# Patient Record
Sex: Male | Born: 1940 | Race: White | Hispanic: Yes | Marital: Married | Smoking: Former smoker
Health system: Southern US, Community
[De-identification: ages and names within clinical notes are randomized; demographics above are authoritative.]

## PROBLEM LIST (undated history)

## (undated) DIAGNOSIS — G459 Transient cerebral ischemic attack, unspecified: Secondary | ICD-10-CM

## (undated) DIAGNOSIS — E785 Hyperlipidemia, unspecified: Secondary | ICD-10-CM

## (undated) DIAGNOSIS — I10 Essential (primary) hypertension: Secondary | ICD-10-CM

## (undated) DIAGNOSIS — I639 Cerebral infarction, unspecified: Secondary | ICD-10-CM

## (undated) DIAGNOSIS — Z85828 Personal history of other malignant neoplasm of skin: Secondary | ICD-10-CM

## (undated) DIAGNOSIS — K2970 Gastritis, unspecified, without bleeding: Secondary | ICD-10-CM

## (undated) DIAGNOSIS — IMO0002 Reserved for concepts with insufficient information to code with codable children: Secondary | ICD-10-CM

## (undated) HISTORY — DX: Personal history of other malignant neoplasm of skin: Z85.828

## (undated) HISTORY — DX: Cerebral infarction, unspecified: I63.9

## (undated) HISTORY — DX: Gastritis, unspecified, without bleeding: K29.70

## (undated) HISTORY — DX: Reserved for concepts with insufficient information to code with codable children: IMO0002

## (undated) HISTORY — DX: Hyperlipidemia, unspecified: E78.5

## (undated) HISTORY — DX: Essential (primary) hypertension: I10

---

## 1995-10-12 HISTORY — PX: HEMORRHOID SURGERY: SHX153

## 2004-12-29 ENCOUNTER — Ambulatory Visit: Payer: Self-pay | Admitting: Family Medicine

## 2004-12-30 ENCOUNTER — Ambulatory Visit: Payer: Self-pay | Admitting: Family Medicine

## 2005-02-02 ENCOUNTER — Ambulatory Visit: Payer: Self-pay | Admitting: Family Medicine

## 2006-07-07 ENCOUNTER — Ambulatory Visit: Payer: Self-pay | Admitting: Family Medicine

## 2006-08-08 ENCOUNTER — Ambulatory Visit: Payer: Self-pay | Admitting: Family Medicine

## 2006-10-21 ENCOUNTER — Ambulatory Visit: Payer: Self-pay | Admitting: Family Medicine

## 2006-10-21 LAB — CONVERTED CEMR LAB
AST: 27 units/L (ref 0–37)
Blood Glucose, Fasting: 91 mg/dL
Calcium: 9.1 mg/dL (ref 8.4–10.5)
Chloride: 103 meq/L (ref 96–112)
Cholesterol: 179 mg/dL (ref 0–200)
Glucose, Bld: 91 mg/dL (ref 70–99)
Lymphocytes Relative: 27 % (ref 12–46)
Lymphs Abs: 1.5 10*3/uL (ref 0.7–3.3)
Monocytes Relative: 7 % (ref 3–11)
Neutro Abs: 3.6 10*3/uL (ref 1.7–7.7)
Neutrophils Relative %: 63 % (ref 43–77)
PSA: 0.78 ng/mL (ref 0.10–4.00)
Platelets: 147 10*3/uL — ABNORMAL LOW (ref 150–400)
Potassium: 4.3 meq/L (ref 3.5–5.3)
RDW: 13.5 % (ref 11.5–14.0)
Total CHOL/HDL Ratio: 3.7
VLDL: 43 mg/dL — ABNORMAL HIGH (ref 0–40)
WBC: 5.7 10*3/uL (ref 4.0–10.5)

## 2006-10-24 ENCOUNTER — Ambulatory Visit: Payer: Self-pay | Admitting: Gastroenterology

## 2006-10-31 ENCOUNTER — Ambulatory Visit: Payer: Self-pay | Admitting: Gastroenterology

## 2006-10-31 ENCOUNTER — Encounter (INDEPENDENT_AMBULATORY_CARE_PROVIDER_SITE_OTHER): Payer: Self-pay | Admitting: *Deleted

## 2007-02-24 ENCOUNTER — Telehealth (INDEPENDENT_AMBULATORY_CARE_PROVIDER_SITE_OTHER): Payer: Self-pay | Admitting: *Deleted

## 2007-06-08 ENCOUNTER — Ambulatory Visit: Payer: Self-pay | Admitting: Family Medicine

## 2007-06-08 ENCOUNTER — Telehealth: Payer: Self-pay | Admitting: Family Medicine

## 2007-06-08 DIAGNOSIS — Z85828 Personal history of other malignant neoplasm of skin: Secondary | ICD-10-CM

## 2007-06-08 DIAGNOSIS — E785 Hyperlipidemia, unspecified: Secondary | ICD-10-CM | POA: Insufficient documentation

## 2007-06-08 DIAGNOSIS — M109 Gout, unspecified: Secondary | ICD-10-CM

## 2007-06-08 DIAGNOSIS — R739 Hyperglycemia, unspecified: Secondary | ICD-10-CM

## 2007-06-08 DIAGNOSIS — I10 Essential (primary) hypertension: Secondary | ICD-10-CM | POA: Insufficient documentation

## 2007-08-07 ENCOUNTER — Ambulatory Visit: Payer: Self-pay | Admitting: Family Medicine

## 2007-08-23 ENCOUNTER — Encounter: Payer: Self-pay | Admitting: Family Medicine

## 2007-10-26 ENCOUNTER — Ambulatory Visit: Payer: Self-pay | Admitting: Family Medicine

## 2007-10-26 DIAGNOSIS — R740 Nonspecific elevation of levels of transaminase and lactic acid dehydrogenase [LDH]: Secondary | ICD-10-CM

## 2007-10-26 LAB — CONVERTED CEMR LAB
Hep B C IgM: NEGATIVE
Hepatitis B Surface Ag: NEGATIVE

## 2007-10-31 LAB — CONVERTED CEMR LAB
ALT: 66 units/L — ABNORMAL HIGH (ref 0–53)
AST: 54 units/L — ABNORMAL HIGH (ref 0–37)
Alkaline Phosphatase: 43 units/L (ref 39–117)
Creatinine, Ser: 0.7 mg/dL (ref 0.4–1.5)
Total Protein: 7.1 g/dL (ref 6.0–8.3)
Uric Acid, Serum: 4.6 mg/dL (ref 2.4–7.0)

## 2007-11-01 LAB — CONVERTED CEMR LAB
ALT: 66 units/L — ABNORMAL HIGH (ref 0–53)
Albumin: 4.1 g/dL (ref 3.5–5.2)
Total Bilirubin: 1.1 mg/dL (ref 0.3–1.2)
Total Protein: 7.1 g/dL (ref 6.0–8.3)

## 2007-11-14 LAB — CONVERTED CEMR LAB
AST: 54 units/L — ABNORMAL HIGH (ref 0–37)
Uric Acid, Serum: 4.6 mg/dL (ref 2.4–7.0)

## 2007-12-01 ENCOUNTER — Ambulatory Visit: Payer: Self-pay | Admitting: Family Medicine

## 2007-12-05 LAB — CONVERTED CEMR LAB
ALT: 41 units/L (ref 0–53)
AST: 26 units/L (ref 0–37)
Alkaline Phosphatase: 39 units/L (ref 39–117)
Bilirubin, Direct: 0.1 mg/dL (ref 0.0–0.3)

## 2008-08-26 ENCOUNTER — Encounter: Payer: Self-pay | Admitting: Family Medicine

## 2008-12-26 ENCOUNTER — Ambulatory Visit: Payer: Self-pay | Admitting: Family Medicine

## 2009-01-03 LAB — CONVERTED CEMR LAB
AST: 32 units/L (ref 0–37)
Albumin: 4.3 g/dL (ref 3.5–5.2)
Alkaline Phosphatase: 45 units/L (ref 39–117)
Bilirubin, Direct: 0.2 mg/dL (ref 0.0–0.3)
Calcium: 9.4 mg/dL (ref 8.4–10.5)
Chloride: 103 meq/L (ref 96–112)
Cholesterol: 154 mg/dL (ref 0–200)
Direct LDL: 86.8 mg/dL
Eosinophils Relative: 6.2 % — ABNORMAL HIGH (ref 0.0–5.0)
Glucose, Bld: 93 mg/dL (ref 70–99)
Lymphocytes Relative: 22.7 % (ref 12.0–46.0)
MCV: 93.1 fL (ref 78.0–100.0)
Monocytes Absolute: 0.5 10*3/uL (ref 0.1–1.0)
Monocytes Relative: 6.6 % (ref 3.0–12.0)
Neutrophils Relative %: 63.7 % (ref 43.0–77.0)
Platelets: 114 10*3/uL — ABNORMAL LOW (ref 150.0–400.0)
Potassium: 3.9 meq/L (ref 3.5–5.1)
RDW: 12.4 % (ref 11.5–14.6)
Sodium: 142 meq/L (ref 135–145)
TSH: 0.68 microintl units/mL (ref 0.35–5.50)
Total CHOL/HDL Ratio: 3
VLDL: 40.8 mg/dL — ABNORMAL HIGH (ref 0.0–40.0)
WBC: 6.9 10*3/uL (ref 4.5–10.5)

## 2009-01-16 ENCOUNTER — Ambulatory Visit: Payer: Self-pay | Admitting: Family Medicine

## 2009-01-16 DIAGNOSIS — D696 Thrombocytopenia, unspecified: Secondary | ICD-10-CM

## 2009-01-20 LAB — CONVERTED CEMR LAB
Basophils Relative: 0.3 % (ref 0.0–3.0)
Eosinophils Absolute: 0.2 10*3/uL (ref 0.0–0.7)
Eosinophils Relative: 3.2 % (ref 0.0–5.0)
Hemoglobin: 15 g/dL (ref 13.0–17.0)
Monocytes Relative: 6.9 % (ref 3.0–12.0)
Neutro Abs: 3.2 10*3/uL (ref 1.4–7.7)
Neutrophils Relative %: 60.8 % (ref 43.0–77.0)
Platelets: 134 10*3/uL — ABNORMAL LOW (ref 150.0–400.0)
RDW: 12.4 % (ref 11.5–14.6)

## 2009-04-21 ENCOUNTER — Ambulatory Visit: Payer: Self-pay | Admitting: Family Medicine

## 2009-04-24 ENCOUNTER — Ambulatory Visit: Payer: Self-pay | Admitting: Oncology

## 2009-04-24 LAB — CONVERTED CEMR LAB
Basophils Relative: 0.2 % (ref 0.0–3.0)
Eosinophils Relative: 4.3 % (ref 0.0–5.0)
Hemoglobin: 15 g/dL (ref 13.0–17.0)
Lymphs Abs: 1.2 10*3/uL (ref 0.7–4.0)
Monocytes Absolute: 0.5 10*3/uL (ref 0.1–1.0)
Monocytes Relative: 8.7 % (ref 3.0–12.0)
Neutrophils Relative %: 65.2 % (ref 43.0–77.0)
RDW: 12.2 % (ref 11.5–14.6)
WBC: 5.6 10*3/uL (ref 4.5–10.5)

## 2009-05-15 ENCOUNTER — Encounter: Payer: Self-pay | Admitting: Family Medicine

## 2009-05-15 LAB — CBC WITH DIFFERENTIAL (CANCER CENTER ONLY)
BASO%: 0.6 % (ref 0.0–2.0)
EOS%: 5.3 % (ref 0.0–7.0)
LYMPH%: 32.9 % (ref 14.0–48.0)
MCH: 32.1 pg (ref 28.0–33.4)
MCV: 93 fL (ref 82–98)
MONO%: 6.4 % (ref 0.0–13.0)
Platelets: 102 10*3/uL — ABNORMAL LOW (ref 145–400)
RDW: 11.7 % (ref 10.5–14.6)
WBC: 4.2 10*3/uL (ref 4.0–10.0)

## 2009-05-15 LAB — CMP (CANCER CENTER ONLY)
ALT(SGPT): 53 U/L — ABNORMAL HIGH (ref 10–47)
AST: 44 U/L — ABNORMAL HIGH (ref 11–38)
Alkaline Phosphatase: 40 U/L (ref 26–84)
BUN, Bld: 12 mg/dL (ref 7–22)
Calcium: 8.7 mg/dL (ref 8.0–10.3)
Creat: 0.8 mg/dl (ref 0.6–1.2)
Total Bilirubin: 0.5 mg/dl (ref 0.20–1.60)

## 2009-05-15 LAB — MORPHOLOGY - CHCC SATELLITE
PLT EST ~~LOC~~: DECREASED
RBC Comments: NORMAL

## 2009-06-12 ENCOUNTER — Ambulatory Visit: Payer: Self-pay | Admitting: Oncology

## 2009-06-17 ENCOUNTER — Encounter: Payer: Self-pay | Admitting: Family Medicine

## 2009-06-17 LAB — GAMMA GT: GGT: 24 U/L (ref 7–51)

## 2009-06-17 LAB — CBC WITH DIFFERENTIAL (CANCER CENTER ONLY)
BASO#: 0 10*3/uL (ref 0.0–0.2)
Eosinophils Absolute: 0.2 10*3/uL (ref 0.0–0.5)
LYMPH%: 29.7 % (ref 14.0–48.0)
MCH: 32.2 pg (ref 28.0–33.4)
MCHC: 34 g/dL (ref 32.0–35.9)
MCV: 95 fL (ref 82–98)
MONO%: 7.8 % (ref 0.0–13.0)
Platelets: 110 10*3/uL — ABNORMAL LOW (ref 145–400)
RBC: 4.51 10*6/uL (ref 4.20–5.70)

## 2009-06-17 LAB — HEPATIC FUNCTION PANEL: ALT: 36 U/L (ref 0–53)

## 2009-06-18 LAB — HEPATITIS B CORE ANTIBODY, TOTAL: Hep B Core Total Ab: NEGATIVE

## 2009-06-18 LAB — HEPATITIS B SURFACE ANTIGEN: Hepatitis B Surface Ag: NEGATIVE

## 2009-06-18 LAB — HEPATITIS C ANTIBODY: HCV Ab: NEGATIVE

## 2009-06-18 LAB — HEPATITIS B SURFACE ANTIBODY,QUALITATIVE: Hep B S Ab: NEGATIVE

## 2009-06-20 LAB — PLATELET ANTIBODIES, DIRECT: Platelet IgG Ab, Direct: NEGATIVE

## 2009-06-20 LAB — PLT GLYCOPROTEIN (INDIRECT) AUTOABS
Plt GP Ia/IIa Autoabs: NOT DETECTED
Plt Glyco IIb/IIIA Autoabs: NOT DETECTED

## 2009-07-16 ENCOUNTER — Ambulatory Visit: Payer: Self-pay | Admitting: Oncology

## 2009-07-17 ENCOUNTER — Encounter: Payer: Self-pay | Admitting: Family Medicine

## 2009-07-17 LAB — CBC WITH DIFFERENTIAL (CANCER CENTER ONLY)
BASO#: 0 10*3/uL (ref 0.0–0.2)
Eosinophils Absolute: 0.2 10*3/uL (ref 0.0–0.5)
HCT: 45.6 % (ref 38.7–49.9)
HGB: 15.3 g/dL (ref 13.0–17.1)
MCH: 31.8 pg (ref 28.0–33.4)
MCV: 95 fL (ref 82–98)
MONO%: 6.3 % (ref 0.0–13.0)
NEUT#: 2.5 10*3/uL (ref 1.5–6.5)
NEUT%: 60 % (ref 40.0–80.0)
RBC: 4.82 10*6/uL (ref 4.20–5.70)

## 2009-09-08 ENCOUNTER — Encounter: Payer: Self-pay | Admitting: Family Medicine

## 2009-11-24 ENCOUNTER — Ambulatory Visit: Payer: Self-pay | Admitting: Family Medicine

## 2009-11-24 ENCOUNTER — Encounter: Admission: RE | Admit: 2009-11-24 | Discharge: 2009-11-24 | Payer: Self-pay | Admitting: Family Medicine

## 2009-11-24 DIAGNOSIS — IMO0002 Reserved for concepts with insufficient information to code with codable children: Secondary | ICD-10-CM | POA: Insufficient documentation

## 2009-11-26 ENCOUNTER — Encounter: Admission: RE | Admit: 2009-11-26 | Discharge: 2009-11-26 | Payer: Self-pay | Admitting: Family Medicine

## 2009-11-28 ENCOUNTER — Telehealth (INDEPENDENT_AMBULATORY_CARE_PROVIDER_SITE_OTHER): Payer: Self-pay | Admitting: *Deleted

## 2010-01-09 ENCOUNTER — Ambulatory Visit: Payer: Self-pay | Admitting: Oncology

## 2010-01-12 ENCOUNTER — Telehealth: Payer: Self-pay | Admitting: Family Medicine

## 2010-01-14 ENCOUNTER — Encounter: Payer: Self-pay | Admitting: Family Medicine

## 2010-01-15 ENCOUNTER — Encounter: Payer: Self-pay | Admitting: Family Medicine

## 2010-01-15 LAB — CBC WITH DIFFERENTIAL (CANCER CENTER ONLY)
BASO#: 0.1 10*3/uL (ref 0.0–0.2)
EOS%: 0.9 % (ref 0.0–7.0)
Eosinophils Absolute: 0.1 10*3/uL (ref 0.0–0.5)
HCT: 43.2 % (ref 38.7–49.9)
HGB: 14.8 g/dL (ref 13.0–17.1)
LYMPH#: 1.1 10*3/uL (ref 0.9–3.3)
MCHC: 34.3 g/dL (ref 32.0–35.9)
MONO#: 0.5 10*3/uL (ref 0.1–0.9)
MONO%: 4.9 % (ref 0.0–13.0)
WBC: 10.8 10*3/uL — ABNORMAL HIGH (ref 4.0–10.0)

## 2010-01-19 ENCOUNTER — Ambulatory Visit: Payer: Self-pay | Admitting: Family Medicine

## 2010-01-20 LAB — CONVERTED CEMR LAB
AST: 29 units/L (ref 0–37)
Alkaline Phosphatase: 39 units/L (ref 39–117)
Bilirubin, Direct: 0.1 mg/dL (ref 0.0–0.3)
Chloride: 102 meq/L (ref 96–112)
Cholesterol: 148 mg/dL (ref 0–200)
GFR calc non Af Amer: 102.57 mL/min (ref >60)
HDL: 58.9 mg/dL (ref >39.00)
LDL Cholesterol: 58 mg/dL (ref 0–99)
Phosphorus: 3.6 mg/dL (ref 2.3–4.6)
Sodium: 141 meq/L (ref 135–145)
TSH: 1.53 microintl units/mL (ref 0.35–5.50)
Total CHOL/HDL Ratio: 3
Triglycerides: 154 mg/dL — ABNORMAL HIGH (ref 0.0–149.0)

## 2010-02-07 ENCOUNTER — Encounter: Payer: Self-pay | Admitting: Family Medicine

## 2010-03-02 ENCOUNTER — Ambulatory Visit: Payer: Self-pay | Admitting: Family Medicine

## 2010-08-06 ENCOUNTER — Ambulatory Visit: Payer: Self-pay | Admitting: Internal Medicine

## 2010-08-06 LAB — CONVERTED CEMR LAB
Bilirubin Urine: NEGATIVE
Glucose, Urine, Semiquant: NEGATIVE
Ketones, urine, test strip: NEGATIVE
Nitrite: POSITIVE
Protein, U semiquant: 100
Urobilinogen, UA: 0.2
Yeast, UA: 0
pH: 6

## 2010-08-07 ENCOUNTER — Encounter: Payer: Self-pay | Admitting: Family Medicine

## 2010-08-07 ENCOUNTER — Telehealth: Payer: Self-pay | Admitting: Family Medicine

## 2010-09-02 ENCOUNTER — Ambulatory Visit: Payer: Self-pay | Admitting: Family Medicine

## 2010-09-06 LAB — CONVERTED CEMR LAB
ALT: 35 units/L (ref 0–53)
AST: 32 units/L (ref 0–37)
Alkaline Phosphatase: 51 units/L (ref 39–117)
BUN: 14 mg/dL (ref 6–23)
Basophils Absolute: 0 10*3/uL (ref 0.0–0.1)
Basophils Relative: 0.7 % (ref 0.0–3.0)
CO2: 27 meq/L (ref 19–32)
Chloride: 105 meq/L (ref 96–112)
Creatinine, Ser: 0.78 mg/dL (ref 0.40–1.50)
Glucose, Bld: 127 mg/dL — ABNORMAL HIGH (ref 70–99)
HDL: 45 mg/dL (ref >39)
Indirect Bilirubin: 0.5 mg/dL (ref 0.0–0.9)
Lymphocytes Relative: 26 % (ref 12.0–46.0)
Lymphs Abs: 1.2 10*3/uL (ref 0.7–4.0)
Monocytes Relative: 10 % (ref 3.0–12.0)
Neutrophils Relative %: 59.4 % (ref 43.0–77.0)
Phosphorus: 2.7 mg/dL (ref 2.3–4.6)
Platelets: 103 10*3/uL — ABNORMAL LOW (ref 150.0–400.0)
RBC: 4.36 M/uL (ref 4.22–5.81)
RDW: 12.9 % (ref 11.5–14.6)
Total CHOL/HDL Ratio: 3.7

## 2010-09-07 ENCOUNTER — Ambulatory Visit: Payer: Self-pay | Admitting: Family Medicine

## 2010-09-07 DIAGNOSIS — R972 Elevated prostate specific antigen [PSA]: Secondary | ICD-10-CM

## 2010-09-09 ENCOUNTER — Encounter: Payer: Self-pay | Admitting: Family Medicine

## 2010-11-10 NOTE — Assessment & Plan Note (Signed)
Summary: REFILL MEDS   Vital Signs:  Patient profile:   70 year old male Height:      71 inches Weight:      179.50 pounds BMI:     25.13 Temp:     98 degrees F oral Pulse rate:   60 / minute Pulse rhythm:   regular BP sitting:   168 / 94  (left arm) Cuff size:   regular  Vitals Entered By: Lewanda Rife LPN (January 19, 2010 8:25 AM) CC: refill meds   History of Present Illness: here for f/u of HTN and lipids and gout - needs med refils  wt is stable   bp up 168/94- was also high at visit with sport med   has been taking ibuprofen 600 mg for a while  for back pain  has deg disk -- that gives him pain in his leg  then was on prednisone  then went to chiropractor -- helped a little bit  then had epidural inj last week- helped a bit  planning on PT - no surgery   is still very good with diet had to stop his exercise   otherwise feels ok     Allergies (verified): No Known Drug Allergies  Past History:  Past Surgical History: Last updated: Nov 03, 2007 Colonoscopy- polyps (1997,  03/1998), neg- no polyps (04/2002),  neg (10/2006) Hemorrhoidectomy (1997) EGD- gastritis, pos H pylori (04/2002)- tx  Family History: Last updated: 2007-11-03 Father: deceased MI 25 Mother: deceased, brain tumor and skin cancer- suspects melanoma Siblings: 1 sister  Social History: Last updated: Nov 03, 2007 Marital Status: Married Children: 3 Occupation: Recruitment consultant occ alcohol   Risk Factors: Smoking Status: quit (06/08/2007)  Past Medical History: Skin cancer, hx of Gout Hyperlipidemia Hypertension gastritis H pylori- partial tx deg disc dz -- with epidural inj   Review of Systems General:  Denies fatigue, fever, loss of appetite, and malaise. Eyes:  Denies blurring and eye pain. CV:  Denies chest pain or discomfort, lightheadness, and palpitations. Resp:  Denies cough and wheezing. GI:  Denies abdominal pain, bloody stools, change in bowel habits, and  indigestion. GU:  Denies nocturia and urinary frequency. MS:  Complains of low back pain, mid back pain, and stiffness; denies joint pain and joint redness. Derm:  Denies itching, lesion(s), poor wound healing, and rash. Neuro:  Denies numbness and tingling. Psych:  Denies anxiety and depression. Endo:  Denies cold intolerance, excessive thirst, excessive urination, and heat intolerance. Heme:  Denies abnormal bruising and bleeding.  Physical Exam  General:  Well-developed,well-nourished,in no acute distress; alert,appropriate and cooperative throughout examination Head:  normocephalic, atraumatic, and no abnormalities observed.   Eyes:  vision grossly intact, pupils equal, pupils round, and pupils reactive to light.  no conjunctival pallor, injection or icterus  Mouth:  pharynx pink and moist.   Neck:  supple with full rom and no masses or thyromegally, no JVD or carotid bruit  Lungs:  Normal respiratory effort, chest expands symmetrically. Lungs are clear to auscultation, no crackles or wheezes. Heart:  Normal rate and regular rhythm. S1 and S2 normal without gallop, murmur, click, rub or other extra sounds. Abdomen:  Bowel sounds positive,abdomen soft and non-tender without masses, organomegaly or hernias noted. no renal bruits  Msk:  No deformity or scoliosis noted of thoracic or lumbar spine.  poor rom LS  Pulses:  R and L carotid,radial,femoral,dorsalis pedis and posterior tibial pulses are full and equal bilaterally Extremities:  No clubbing, cyanosis, edema, or deformity noted  with normal full range of motion of all joints.   Neurologic:  sensation intact to light touch, gait normal, and DTRs symmetrical and normal.   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) Assessment Comment Only  with ongoing nsaid use and recent pred and injection sugar may be up  will continue f/u ortho and PT   Orders: Prescription Created Electronically [phone])  Problem # 2:  THROMBOCYTOPENIA (ICD-287.5) Assessment: Improved  per pt this is much imp - continued f/u with heme with obs only  Orders: Prescription Created Electronically [phone])  Problem # 3:  Hx of HYPERGLYCEMIA (ICD-790.29) Assessment: Unchanged  expect sugar up from pred and epidural inj- will check disc healthy diet (low simple sugar/ choose complex carbs/ low sat fat) diet and exercise in detail  Orders: Venipuncture (03500) TLB-Lipid Panel (80061-LIPID) TLB-Renal Function Panel (80069-RENAL) TLB-Hepatic/Liver Function Pnl (80076-HEPATIC) TLB-TSH (Thyroid Stimulating Hormone) (93818-EXH) Prescription Created Electronically [phone])  Labs Reviewed: Creat: 0.8 (12/26/2008)     Problem # 4:  GOUT (ICD-274.9) Assessment: Comment Only very good with allopurinol followed by rheum The following medications were removed from the medication list:    Colchicine 0.6 Mg Tabs (Colchicine) .Marland Kitchen... 1/2 qd His updated medication list for this problem includes:    Allopurinol 300 Mg Tabs (Allopurinol) ..... One by mouth qd  Orders: Venipuncture (67893) TLB-Lipid Panel (80061-LIPID) TLB-Renal Function Panel (80069-RENAL) TLB-Hepatic/Liver Function Pnl (80076-HEPATIC) TLB-TSH (Thyroid Stimulating Hormone) (81017-PZW) Prescription Created Electronically [id number])  Problem # 5:  HYPERTENSION (ICD-401.9) Assessment: Deteriorated bp up -- may be multifactorial with nsaid/pain and lack of exercise inc lisinopril to 20  update if side eff lab today  f/u 6 wk His updated medication list for this problem includes:    Zebeta 5 Mg Tabs (Bisoprolol fumarate) .Marland Kitchen... Take 1 1/2 by mouth once a day    Lisinopril 20 Mg Tabs (Lisinopril) .Marland Kitchen... 1 by mouth once daily  Orders: Venipuncture  (77824) TLB-Lipid Panel (80061-LIPID) TLB-Renal Function Panel (80069-RENAL) TLB-Hepatic/Liver Function Pnl (80076-HEPATIC) TLB-TSH (Thyroid Stimulating Hormone) (23536-RWE) Prescription Created Electronically [phone])  Complete Medication List: 1)  Zebeta 5 Mg Tabs (Bisoprolol fumarate) .... Take 1 1/2 by mouth once a day 2)  Allopurinol 300 Mg Tabs (Allopurinol) .... One by mouth qd 3)  Lisinopril 20 Mg Tabs (Lisinopril) .Marland Kitchen.. 1 by mouth once daily 4)  Vitamin E  5)  Calcium  6)  Fish Oil  7)  Vitamin C 1000 Mg Tabs (Ascorbic acid) .... Take 1 tablet by mouth once a day  Patient Instructions: 1)  your blood pressure is high 2)  take ibuprofen only if you really need it  3)  avoid salt and drink lots of water 4)  exercise when you are able  5)  increase lisinopril to 20 mg total once per day -- I sent px to your pharmacy  6)  labs today 7)  follow up in 6 weeks with me for blood pressure visit  Prescriptions: LISINOPRIL 20 MG TABS (LISINOPRIL) 1 by mouth once daily  #30 x 11   Entered and Authorized by:   Judith Part MD   Signed by:   Judith Part MD on 01/19/2010   Method used:   Electronically to        [facility] [phone]* (retail)  [address] [place], [place]  [id number]       Ph: [phone]       Fax: [phone]   RxID:   [id number]   Current Allergies (reviewed today): No known allergies

## 2010-11-10 NOTE — Progress Notes (Signed)
Summary: not any better  Phone Note Call from Patient Call back at Home Phone [phone]   Caller: Patient Summary of Call: Pt was started on cipro yesterday for UTI.  He is concerned because he still has fever.  I advised him that he needs to give abx more time to work.  Advised him to see how he does over the week end and call back on monday if not better. Initial call taken by: Lowella Petties CMA, AAMA,  August 07, 2010 11:27 AM  Follow-up for Phone Call        agree with giving abx more time.  can we ask him how high fever was? would expect fever to be less than 101.5. Follow-up by: Eustaquio Boyden  MD,  August 07, 2010 11:32 AM  Additional Follow-up for Phone Call Additional follow up Details #1::        Temp 101-102, comes down with tylenol.          Lowella Petties CMA, AAMA  August 07, 2010 11:48 AM     Additional Follow-up for Phone Call Additional follow up Details #2::    thanks.  if still sxs over weekend, will need to return for further evaluation next week.  awaiting UCx.  ? prostatitis, if so will need prolonged course. Follow-up by: Eustaquio Boyden  MD,  August 07, 2010 1:51 PM

## 2010-11-10 NOTE — Assessment & Plan Note (Signed)
Summary: 6WK F/U FOR BP CHECK / LFW   Vital Signs:  Patient profile:   70 year old male Height:      71 inches Weight:      179.25 pounds BMI:     25.09 Temp:     97.8 degrees F oral Pulse rate:   60 / minute Pulse rhythm:   regular BP sitting:   154 / 90  (left arm) Cuff size:   regular  Vitals Entered By: Lewanda Rife LPN (Mar 02, 2010 9:23 AM)  Serial Vital Signs/Assessments:  Time      Position  BP       Pulse  Resp  Temp     By                     130/85                         Colon Flattery Derik Fults MD                     132/85                         Judith Part MD  CC: six week f/u B P   History of Present Illness: here for f/u of HTN and lipids  last visit - bp high and lisinopril inc to 20 mg -- no problems or side effects  also on zebeta  bp implst today 154/90  at home is 135/85 usually -- better at home   is eating well , not enough exercise due to back and leg problems   lipids were good with both hdl and ldl at 58  gluc 132 after steroid shot for back   Allergies (verified): No Known Drug Allergies  Past History:  Past Medical History: Last updated: 01/19/2010 Skin cancer, hx of Gout Hyperlipidemia Hypertension gastritis H pylori- partial tx deg disc dz -- with epidural inj   Past Surgical History: Last updated: 2007/10/31 Colonoscopy- polyps (1997,  03/1998), neg- no polyps (04/2002),  neg (10/2006) Hemorrhoidectomy (1997) EGD- gastritis, pos H pylori (04/2002)- tx  Family History: Last updated: 31-Oct-2007 Father: deceased MI 50 Mother: deceased, brain tumor and skin cancer- suspects melanoma Siblings: 1 sister  Social History: Last updated: 10-31-07 Marital Status: Married Children: 3 Occupation: Recruitment consultant occ alcohol   Risk Factors: Smoking Status: quit (06/08/2007)  Review of Systems General:  Denies fatigue, fever, loss of appetite, and malaise. Eyes:  Denies blurring and eye irritation. CV:  Denies  chest pain or discomfort, lightheadness, palpitations, shortness of breath with exertion, and swelling of feet. Resp:  Denies cough, shortness of breath, and wheezing. GI:  Denies abdominal pain and change in bowel habits. GU:  Denies urinary frequency. MS:  Denies joint pain, joint redness, and joint swelling. Derm:  Denies itching, lesion(s), poor wound healing, and rash. Neuro:  Denies headaches, numbness, and tingling. Psych:  Denies anxiety and depression. Endo:  Denies cold intolerance and heat intolerance. Heme:  Denies abnormal bruising and bleeding.  Physical Exam  General:  Well-developed,well-nourished,in no acute distress; alert,appropriate and cooperative throughout examination Head:  normocephalic, atraumatic, and no abnormalities observed.   Eyes:  vision grossly intact, pupils equal, pupils round, and pupils reactive to light.   Mouth:  pharynx pink and moist.   Neck:  supple with full rom and no masses or thyromegally, no JVD or carotid  bruit  Lungs:  Normal respiratory effort, chest expands symmetrically. Lungs are clear to auscultation, no crackles or wheezes. Heart:  Normal rate and regular rhythm. S1 and S2 normal without gallop, murmur, click, rub or other extra sounds. Abdomen:  no renal bruits  Msk:  No deformity or scoliosis noted of thoracic or lumbar spine.  poor rom LS -- but improved from last visit  Pulses:  R and L carotid,radial,femoral,dorsalis pedis and posterior tibial pulses are full and equal bilaterally Extremities:  No clubbing, cyanosis, edema, or deformity noted with normal full range of motion of all joints.   Neurologic:  sensation intact to light touch, gait normal, and DTRs symmetrical and normal.   Skin:  Intact without suspicious lesions or rashes tanned Cervical Nodes:  No lymphadenopathy noted Psych:  normal affect, talkative and pleasant    Impression & Recommendations:  Problem # 1:  HYPERTENSION (ICD-401.9) Assessment  Improved  bp is better on second check today exp further imp as he is able to be more active disc low salt diet  continue currentmeds  f/u 6 mo (will do labs and health mt discussion then too)  His updated medication list for this problem includes:    Zebeta 5 Mg Tabs (Bisoprolol fumarate) .Marland Kitchen... Take 1 1/2 by mouth once a day    Lisinopril 20 Mg Tabs (Lisinopril) .Marland Kitchen... 1 by mouth once daily  BP today: 154/90-- re check at rest 130/85- bilat  Prior BP: 168/94 (01/19/2010)  Labs Reviewed: K+: 4.1 (01/19/2010) Creat: : 0.8 (01/19/2010)   Chol: 148 (01/19/2010)   HDL: 58.90 (01/19/2010)   LDL: 58 (01/19/2010)   TG: 154.0 (01/19/2010)  Problem # 2:  HYPERLIPIDEMIA (ICD-272.4) Assessment: Unchanged good control with good HDL and LDL  rev low sat fat diet - doing well  Labs Reviewed: SGOT: 29 (01/19/2010)   SGPT: 48 (01/19/2010)   HDL:58.90 (01/19/2010), 49.00 (12/26/2008)  LDL:58 (01/19/2010), 88 (10/21/2006)  Chol:148 (01/19/2010), 154 (12/26/2008)  Trig:154.0 (01/19/2010), 204.0 (12/26/2008)  Complete Medication List: 1)  Zebeta 5 Mg Tabs (Bisoprolol fumarate) .... Take 1 1/2 by mouth once a day 2)  Allopurinol 300 Mg Tabs (Allopurinol) .... One by mouth qd 3)  Lisinopril 20 Mg Tabs (Lisinopril) .Marland Kitchen.. 1 by mouth once daily 4)  Vitamin E  .... Take 1 tablet by mouth once a day 5)  Calcium  .... Take 1 tablet by mouth once a day 6)  Fish Oil  .... Take 1 capsule by mouth once a day 7)  Vitamin C 1000 Mg Tabs (Ascorbic acid) .... Take 1 tablet by mouth once a day  Patient Instructions: 1)  blood pressure is improved- keep check at home 2)  if you start getting readings over 140/90-- let me know  3)  otherwise watch salt and fat in diet  4)  cholesterol is good  5)  follow up with me in about 6 months for PE 6)  labs 1 week before with lipid/hepatic/ renal / cbc with diff/ tsh 401.1 and psa for prostate screen   Current Allergies (reviewed today): No known allergies

## 2010-11-10 NOTE — Letter (Signed)
[facility]   Imported By: Lanelle Bal 02/11/2010 12:35:01  _____________________________________________________________________  External Attachment:    Type:   Image     Comment:   External Document

## 2010-11-10 NOTE — Assessment & Plan Note (Signed)
Summary: RIGHT LEG PAIN/CLE   Vital Signs:  Patient profile:   70 year old male Height:      71 inches Weight:      179.8 pounds BMI:     25.17 Temp:     97.9 degrees F oral Pulse rate:   60 / minute Pulse rhythm:   regular BP sitting:   150 / 90  (left arm) Cuff size:   regular  Vitals Entered By: Benny Lennert CMA Duncan Dull) (November 24, 2009 11:03 AM)  History of Present Illness: Chief complaint right hip leg pain  70 year old male:  Normally has been able to run about thirty miles a week for many years and has been highly active male  Now he was moving a boat trailer and felt something hurt a lot on his right lateral hip. Hoping that it would go away on its own. Tylenol and put some heat pads on it at night. Now every   four hours.   Also having some radiculopathy on the right side that is waxing and waning.  Sometimes will hurt and have to stop walking. Pain goes away when away with bending over at the waist.   R leg, decreased knee reflex  Back Pain History:      The patient's back pain started approximately 10/20/2009.  The pain is located in the lower back region and does radiate below the knees.  On a scale of 1-10, he describes the pain as a 10.  He states that he has no prior history of back pain.  The following makes the back pain better: bending over at the waist.    Critical Exclusionary Diagnosis Criteria (CEDC) for Back Pain:      The patient denies a history of previous trauma.  He has no prior history of spinal surgery.  There are no symptoms to suggest infection, cauda equina, or psychosocial factors for back pain.  Cancer risk factors include age >50 yrs with new back pain and no improvement in low back pain after 4-6 weeks therapy.  Other positive CEDC factors include myelopathic symptoms (lost reflexes &/or weakness lower extremity).    Allergies (verified): No Known Drug Allergies  Past History:  Past medical, surgical, family and social histories  (including risk factors) reviewed, and no changes noted (except as noted below).  Past Medical History: Reviewed history from 10/26/2007 and no changes required. Skin cancer, hx of Gout Hyperlipidemia Hypertension gastritis H pylori- partial tx  Past Surgical History: Reviewed history from 10/26/2007 and no changes required. Colonoscopy- polyps (1997,  03/1998), neg- no polyps (04/2002),  neg (10/2006) Hemorrhoidectomy (1997) EGD- gastritis, pos H pylori (04/2002)- tx  Family History: Reviewed history from 10/26/2007 and no changes required. Father: deceased MI 41 Mother: deceased, brain tumor and skin cancer- suspects melanoma Siblings: 1 sister  Social History: Reviewed history from 10/26/2007 and no changes required. 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         o/w as above  Physical Exam  General:  GEN: Well-developed,well-nourished,in no acute distress; alert,appropriate and cooperative throughout examination HEENT: Normocephalic and atraumatic without obvious abnormalities. No apparent alopecia or balding. Ears, externally no deformities PULM: Breathing comfortably in no respiratory distress EXT: No clubbing, cyanosis, or edema PSYCH: Normally interactive. Cooperative during the interview. Pleasant. Friendly and conversant. Not anxious or depressed appearing. Normal, full affect.  Msk:  Normal Greater trochanteric bursae Full hip ROM Sciatic Notches: NT Sensation to Gross touch WNL Sensation to pinpricnk WNL no clonus DP and PT pulses are normal B   Low Back Pain Physical Exam:    Inspection-deformity:     No    Palpation-spinal tenderness:   No    Motor Exam/Strength:         Left Ankle Dorsiflexion (L5,L4):     normal       Left Great Toe Dorsiflexion (L5,L4):     normal       Left Heel Walk (L5,some L4):      normal       Left Single Squat & Rise-Quads (L4):   normal       Left Toe Walk-calf (S1):       normal       Right Ankle Dorsiflexion (L5,L4):     normal       Right Great Toe Dorsiflexion (L5,L4):       normal       Right Heel Walk (L5,some L4):     normal       Right Single Squat & Rise Quads (L4):   normal       Right Toe Walk-calf (S1):       normal    Sensory Exam/Pinprick:        Left Medial Foot (L4):   normal       Left Dorsal Foot (L5):   normal       Left Lateral Foot (S1):   normal       Right Medial Foot (L4):   normal       Right Dorsal Foot (L5):   normal       Right Lateral Foot (S1):   normal    Reflexes:        Left Knee Jerk (L4):     normal       Left Ankle Reflex (S1):   normal       Right Knee Jerk:     decreased       Right Ankle Reflex (S1):   normal    Straight Leg Raise (SLR):       Left Straight Leg Raise (SLR):   negative       Right Straight Leg Raise (SLR):   negative   Impression & Recommendations:  Problem # 1:  HERNIATED DISC (ICD-722.2) Assessment New Suspect acute disk rupture - certainly could be significant canal stenosis or R sided foraminal impingement.   R knee reflexes are much decreased compared to L Obtain plain spine films and MRI to evaluate for disk herniation, cord edema, spinal stenosis and to direct further definitive management.  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 1 by mouth 3 times daily as needed for back pain  Orders: Radiology Referral (Radiology) Radiology Referral (Radiology)  Complete Medication List: 1)  Zebeta 5 Mg Tabs (Bisoprolol fumarate) .... Take 1 1/2 by mouth once a day 2)  Colchicine 0.6 Mg Tabs (Colchicine) .... 1/2 qd 3)   Allopurinol 300 Mg Tabs (Allopurinol) .... One by mouth qd 4)  Zestril 10 Mg Tabs (Lisinopril) .Marland KitchenMarland KitchenMarland Kitchen  One by mouth once daily 5)  Vitamin C  6)  Vitamin E  7)  Calcium  8)  Fish Oil  9)  Prednisone 10 Mg Tabs (Prednisone) .... 4 tabs by mouth for 6 days, then 3 tabs by mouth for 4 days, then 2 tabs by mouth for 4 days, then 1 tab by mouth for 4 days 10)  Cyclobenzaprine Hcl 10 Mg Tabs (Cyclobenzaprine hcl) .Marland Kitchen.. 1 by mouth 3 times daily as needed for back pain  Indications for MRI of L/S Spine Based on Back Pain History and Exam: 1)   Age >50 yrs with new back pain 2)   no improvement in LBP after 4-6 wks Rx 3)   Sciatica (pain radiates below knee) 4)   myelopathic sx's (lost reflexes &/or weakness lower ext.)  Patient Instructions: 1)  f/u when back from [place] with me 2)  Referral Appointment Information 3)  Day/Date: 4)  Time: 5)  Place/MD: 6)  Address: 7)  Phone/Fax: 8)  Patient given appointment information. Information/Orders faxed/mailed.  Prescriptions: CYCLOBENZAPRINE HCL 10 MG  TABS (CYCLOBENZAPRINE HCL) 1 by mouth 3 times daily as needed for back pain  #40 x 0   Entered and Authorized by:   Hannah Beat MD   Signed by:   Hannah Beat MD on 11/24/2009   Method used:   Print then Give to Patient   RxID:   [id number] PREDNISONE 10 MG  TABS (PREDNISONE) 4 tabs by mouth for 6 days, then 3 tabs by mouth for 4 days, then 2 tabs by mouth for 4 days, then 1 tab by mouth for 4 days  #48 x 0   Entered and Authorized by:   Hannah Beat MD   Signed by:   Hannah Beat MD on 11/24/2009   Method used:   Print then Give to Patient   RxID:   [id number]   Current Allergies (reviewed today): No known allergies

## 2010-11-10 NOTE — Op Note (Signed)
Summary: Lumboscaral Epidural Steroid Injection/[facility]  Lumboscaral Epidural Steroid Injection/[facility]   Imported By: Maryln Gottron 02/12/2010 11:08:44  _____________________________________________________________________  External Attachment:    Type:   Image     Comment:   External Document

## 2010-11-10 NOTE — Progress Notes (Signed)
Summary: Bisoprolol and Lisinopril refills  Phone Note Refill Request Call back at [phone] Message from:  [facility] on January 12, 2010 12:56 PM  Refills Requested: Medication #1:  ZEBETA 5 MG TABS Take 1 1/2 by mouth once a day   Last Refilled: 12/02/2009  Medication #2:  ZESTRIL 10 MG  TABS one by mouth once daily   Last Refilled: 12/02/2009 Pt had CPX on 12/26/08 with Dr Milinda Antis and saw Dr Patsy Lager on 11/24/09. When do you want pt seen?   Method Requested: Telephone to Pharmacy Initial call taken by: Lewanda Rife LPN,  January 12, 2010 1:00 PM  Follow-up for Phone Call        f/u for PE any time -has been over a year  px written on EMR for call in  Follow-up by: Judith Part MD,  January 12, 2010 1:14 PM  Additional Follow-up for Phone Call Additional follow up Details #1::        Left message for pt on v/m at home and also [phone]. Pt has already left work. Medication phoned to [facility]  pharmacy as instructed. Requested pharmacy to have pt call for appt.Lewanda Rife LPN  January 13, 4539 4:50 PM     Prescriptions: ZESTRIL 10 MG  TABS (LISINOPRIL) one by mouth once daily  #30 x 11   Entered and Authorized by:   Judith Part MD   Signed by:   Lewanda Rife LPN on 98/08/9146   Method used:   Telephoned to ...         RxID:   [id number] ZEBETA 5 MG TABS (BISOPROLOL FUMARATE) Take 1 1/2 by mouth once a day  #45 x 11   Entered and Authorized by:   Judith Part MD   Signed by:   Lewanda Rife LPN on 96/29/5284   Method used:   Telephoned to ...         RxID:   [id number]

## 2010-11-10 NOTE — Progress Notes (Signed)
Summary: wants follow up   Phone Note Call from Patient Call back at [phone]   Caller: Patient Call For: Nurse- Herbert Seta  Summary of Call: Patient called asking about when he should come in for a follow up with Dr. Patsy Lager about the MRI results. Please advise.  Initial call taken by: Melody Comas,  November 28, 2009 9:19 AM  Follow-up for Phone Call        spoke with patient about dr coplands recommendations patient says he is leaving country for a month and will call back then to give update Follow-up by: Benny Lennert CMA Duncan Dull),  November 28, 2009 9:34 AM

## 2010-11-10 NOTE — Assessment & Plan Note (Signed)
Summary: CPX / LFW   Vital Signs:  Patient profile:   70 year old male Height:      71.25 inches Weight:      177.25 pounds BMI:     24.64 Temp:     97.6 degrees F oral Pulse rate:   60 / minute Pulse rhythm:   regular BP sitting:   148 / 84  (left arm) Cuff size:   large  Vitals Entered By: Lewanda Rife LPN (September 07, 2010 9:31 AM) CC: check up chronic med probs Comments Pt has been rushing this AM.   History of Present Illness: here for check up of chronic medical problems and to review health mt list   wt is down 3 lb with good bmi of 24  HTN -- worse with first bp of 148/84 was rushing to get here  did take his med today at home - bp is usually good at home -- usually 120s/70s-- later in the day   lipids are high with trig of 455 so rest unable to calc-- this is unusual for him  sugar is 127  no fam hx of DM and he is not overwt  early in year had some problems with his back - sciatic nerve - got some shots and had to stop his exercise completely , another shot in summer  slowly got better -- is getting back to running now  is eating a very healthy diet and as a rule no sweets    psa also up from 3.39 from 1.11 had prostate infection 2 months ago -- took abx and it got better  no std exp at all is totally better  no nocturia at all and no trouble with stream   colonosc ok in 08  Td 04  flu / pneumovax-- not interested  shingles vaccine  Allergies (verified): No Known Drug Allergies  Past History:  Past Medical History: Last updated: 01/19/2010 Skin cancer, hx of Gout Hyperlipidemia Hypertension gastritis H pylori- partial tx deg disc dz -- with epidural inj   Past Surgical History: Last updated: 11/09/07 Colonoscopy- polyps (1997,  03/1998), neg- no polyps (04/2002),  neg (10/2006) Hemorrhoidectomy (1997) EGD- gastritis, pos H pylori (04/2002)- tx  Family History: Last updated: 2007/11/09 Father: deceased MI 47 Mother: deceased, brain  tumor and skin cancer- suspects melanoma Siblings: 1 sister  Social History: Last updated: 09/07/2010 Marital Status: Married From [place], [place] Children: 3 Occupation: [employer] occ alcohol   Risk Factors: Smoking Status: quit (06/08/2007)  Social History: Marital Status: Married From [place], [place] Children: 3 Occupation: Recruitment consultant occ alcohol   Review of Systems General:  Denies chills, fatigue, fever, loss of appetite, and malaise. Eyes:  Denies blurring and eye irritation. CV:  Denies chest pain or discomfort and palpitations. Resp:  Denies cough, shortness of breath, and wheezing. GI:  Denies abdominal pain, change in bowel habits, indigestion, nausea, and vomiting. GU:  Denies discharge, dysuria, erectile dysfunction, nocturia, urinary frequency, and urinary hesitancy. MS:  Denies joint pain, joint redness, and joint swelling. Derm:  Denies lesion(s), poor wound healing, and rash. Neuro:  Denies numbness and tingling. Psych:  Denies anxiety and depression. Endo:  Denies cold intolerance, excessive thirst, excessive urination, and heat intolerance. Heme:  Denies abnormal bruising and bleeding.  Physical Exam  General:  Well-developed,well-nourished,in no acute distress; alert,appropriate and cooperative throughout examination Head:  normocephalic, atraumatic, and no abnormalities observed.   Eyes:  vision grossly intact, pupils equal, pupils round, and  pupils reactive to light.  no conjunctival pallor, injection or icterus  Ears:  R ear normal and L ear normal.   Nose:  no nasal discharge.   Mouth:  pharynx pink and moist.   Neck:  supple with full rom and no masses or thyromegally, no JVD or carotid bruit  Chest Wall:  No deformities, masses, tenderness or gynecomastia noted. Lungs:  Normal respiratory effort, chest expands symmetrically. Lungs are clear to auscultation, no crackles or wheezes. Heart:  Normal rate and regular rhythm.  S1 and S2 normal without gallop, murmur, click, rub or other extra sounds. Abdomen:  Bowel sounds positive,abdomen soft and non-tender without masses, organomegaly or hernias noted.  no CVA tenderness no renal bruits  Rectal:  No external abnormalities noted. Normal sphincter tone. No rectal masses or tenderness. Prostate:  Prostate gland firm and smooth, no enlargement, nodularity, tenderness, mass, asymmetry or induration. Msk:  No deformity or scoliosis noted of thoracic or lumbar spine.  poor rom LS -- but improved from last visit  Pulses:  R and L carotid,radial,femoral,dorsalis pedis and posterior tibial pulses are full and equal bilaterally Extremities:  No clubbing, cyanosis, edema, or deformity noted with normal full range of motion of all joints.   Neurologic:  sensation intact to light touch, gait normal, and DTRs symmetrical and normal.   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 Take 1 1/2 by mouth once a day    Lisinopril 20 Mg Tabs (Lisinopril) .Marland Kitchen... 1 by mouth once daily  BP today: 148/84 Prior BP: 128/80 (08/06/2010)  Labs Reviewed: K+: 4.1 (09/02/2010) Creat: : 0.78 (09/02/2010)   Chol: 168 (09/02/2010)   HDL: 45 (09/02/2010)   LDL: See Comment mg/dL (16/07/9603)   TG: 540 (09/02/2010)  Problem # 4:  HYPERLIPIDEMIA (ICD-272.4) Assessment: Deteriorated  trig way up -- also with milldy elevated sugar disc low fat and low sugar diet  also getting back to exercise re check 3 mo and f/u  if not imp may need to disc med for this   Labs Reviewed: SGOT: 32 (09/02/2010)   SGPT: 35 (09/02/2010)   HDL:45 (09/02/2010), 58.90 (01/19/2010)  LDL:See Comment mg/dL (98/08/9146), 58 (82/95/6213)  Chol:168 (09/02/2010), 148 (01/19/2010)  Trig:455 (09/02/2010), 154.0 (01/19/2010)  Problem # 5:  GOUT (ICD-274.9) Assessment: Unchanged in very good control with allopurinol and LFTs nl as well His updated medication list for this  problem includes:    Allopurinol 300 Mg Tabs (Allopurinol) ..... One by mouth qd  Problem # 6:  PSA, INCREASED (ICD-790.93) Assessment: New this is up from 1.11 to 3.39 ? if poss from prostate infection within last several mon asymptomatic and nl exam  will re check this in 3 mo and f/u  Problem # 7:  Preventive Health Care (ICD-V70.0) Assessment: Comment Only pt considering shingles vaccine and will check ins about coverage  Complete Medication List: 1)  Zebeta 5 Mg Tabs (Bisoprolol fumarate) .... Take 1 1/2 by mouth once a day 2)  Allopurinol 300 Mg Tabs (Allopurinol) .... One by mouth qd 3)  Lisinopril 20 Mg Tabs (Lisinopril) .Marland Kitchen.. 1 by mouth once daily 4)  Vitamin E  .... Take 1 tablet by mouth once a day 5)  Calcium  .... Take 1 tablet by mouth once a day 6)  Fish Oil  .... Take 1 capsule by mouth once a day 7)  Vitamin C 1000 Mg Tabs (Ascorbic acid) .... Take 1 tablet by mouth once a  day  Contraindications/Deferment of Procedures/Staging:    Test/Procedure: FLU VAX    Reason for deferment: patient declined     Test/Procedure: Pneumovax vaccine    Reason for deferment: patient declined   Patient Instructions: 1)  if you are interested in a shingles vaccine- let me know and call your insurance company first  2)  get back to your regular exercise program -- hopefully this will help your cholestrol and sugar and blood pressure  3)  also avoid fats and sugar in diet  4)  I think your elevated psa is from recent prostate infection  5)  we will watch this to make sure it is ok  6)  schedule fasting labs in 3 months lipid/ast/alt/glucose/ AIC/ psa for 272, hyperglycemia and elevated psa, and then follow up to discuss and re check blood pressure 7)  please check your blood pressure at different times of day and bring your cuff to your next visit    Orders Added: 1)  Est. Patient Level IV [04540]    Current Allergies (reviewed today): No known allergies

## 2010-11-10 NOTE — Assessment & Plan Note (Signed)
Summary: fever, frequent urinating/alc   Vital Signs:  Patient profile:   70 year old male Weight:      180.25 pounds Temp:     98.9 degrees F oral Pulse rate:   76 / minute Pulse rhythm:   regular BP sitting:   128 / 80  (left arm) Cuff size:   large  Vitals Entered By: Selena Batten Dance CMA Duncan Dull) (August 06, 2010 10:31 AM) CC: Fever and frequent urination   History of Present Illness: CC: fever, urination  1d h/o fever, increased urination (1x/hour).  + lots of nocturia.  no dysuria. + urgency.    Took tylenol at 4am which improved fever.  No abd pain, back pain, nausea/vomiting.  No discharge.  never had UTI, never had kidney stones.  remote smoking, quit 40 yrs ago.    Current Medications (verified): 1)  Zebeta 5 Mg Tabs (Bisoprolol Fumarate) .... Take 1 1/2 By Mouth Once A Day 2)  Allopurinol 300 Mg  Tabs (Allopurinol) .... One By Mouth Qd 3)  Lisinopril 20 Mg Tabs (Lisinopril) .Marland Kitchen.. 1 By Mouth Once Daily 4)  Vitamin E .... Take 1 Tablet By Mouth Once A Day 5)  Calcium .... Take 1 Tablet By Mouth Once A Day 6)  Fish Oil .... Take 1 Capsule By Mouth Once A Day 7)  Vitamin C 1000 Mg Tabs (Ascorbic Acid) .... Take 1 Tablet By Mouth Once A Day  Allergies (verified): No Known Drug Allergies  Past History:  Past Medical History: Last updated: 01/19/2010 Skin cancer, hx of Gout Hyperlipidemia Hypertension gastritis H pylori- partial tx deg disc dz -- with epidural inj   Social History: Marital Status: Married From [place], [place] Children: 3 Occupation: Recruitment consultant occ alcohol   Review of Systems       per HPI  Physical Exam  General:  Well-developed,well-nourished,in no acute distress; alert,appropriate and cooperative throughout examination Lungs:  Normal respiratory effort, chest expands symmetrically. Lungs are clear to auscultation, no crackles or wheezes. Heart:  Normal rate and regular rhythm. S1 and S2 normal without gallop, murmur, click,  rub or other extra sounds. Abdomen:  Bowel sounds positive,abdomen soft and non-tender without masses, organomegaly or hernias noted.  no CVA tenderness Pulses:  2+ rad pulses Extremities:  no edema   Impression & Recommendations:  Problem # 1:  UTI (ICD-599.0)  complicated given male.  treat with 7 day course cipro.  ucx sent.  red flags to return discussed  His updated medication list for this problem includes:    Ciprofloxacin Hcl 500 Mg Tabs (Ciprofloxacin hcl) .Marland Kitchen... Take one by mouth two times a day x 7 days  Orders: UA Dipstick W/ Micro (manual) (16109) Specimen Handling (99000) T-Culture, Urine ([id number])  Complete Medication List: 1)  Zebeta 5 Mg Tabs (Bisoprolol fumarate) .... Take 1 1/2 by mouth once a day 2)  Allopurinol 300 Mg Tabs (Allopurinol) .... One by mouth qd 3)  Lisinopril 20 Mg Tabs (Lisinopril) .Marland Kitchen.. 1 by mouth once daily 4)  Vitamin E  .... Take 1 tablet by mouth once a day 5)  Calcium  .... Take 1 tablet by mouth once a day 6)  Fish Oil  .... Take 1 capsule by mouth once a day 7)  Vitamin C 1000 Mg Tabs (Ascorbic acid) .... Take 1 tablet by mouth once a day 8)  Ciprofloxacin Hcl 500 Mg Tabs (Ciprofloxacin hcl) .... Take one by mouth two times a day x 7 days  Patient Instructions: 1)  Looks like a UTI (urinary tract infection) 2)  Treat with antibiotics  3)  Please return if not improving with treatment, if fever >101.5 remaining, if nausea/vomiting or if back pain, or if other concerns. 4)  push fluids and cranberry juice for bladder health. Prescriptions: CIPROFLOXACIN HCL 500 MG TABS (CIPROFLOXACIN HCL) take one by mouth two times a day x 7 days  #14 x 0   Entered and Authorized by:   Eustaquio Boyden  MD   Signed by:   Eustaquio Boyden  MD on 08/06/2010   Method used:   Electronically to        [facility]. [address] [phone]* (retail)       [address] [place], [place]  [address]       Ph: [phone]       Fax: [phone]   RxID:    [id number]    Orders Added: 1)  Est. Patient Level III [02725] 2)  UA Dipstick W/ Micro (manual) [81000] 3)  Specimen Handling [99000] 4)  T-Culture, Urine [[id number]]    Current Allergies (reviewed today): No known allergies   Laboratory Results   Urine Tests  Date/Time Received: August 06, 2010  Date/Time Reported: August 06, 2010   Routine Urinalysis   Color: yellow Appearance: Cloudy Glucose: negative   (Normal Range: Negative) Bilirubin: negative   (Normal Range: Negative) Ketone: negative   (Normal Range: Negative) Spec. Gravity: >=1.030   (Normal Range: 1.003-1.035) Blood: large   (Normal Range: Negative) pH: 6.0   (Normal Range: 5.0-8.0) Protein: 100   (Normal Range: Negative) Urobilinogen: 0.2   (Normal Range: 0-1) Nitrite: positive   (Normal Range: Negative) Leukocyte Esterace: large   (Normal Range: Negative)  Urine Microscopic WBC/HPF: TNTC RBC/HPF: 10-20 Bacteria/HPF: 1+  Mucous/HPF: no Epithelial/HPF: 0 Crystals/HPF: 0 Yeast/HPF: 0    Comments: read by ..........................Eustaquio Boyden  MD  August 06, 2010 10:54 AM UCx sent.     Appended Document: fever, frequent urinating/alc HPI addendum: subjective fever.

## 2010-11-10 NOTE — Consult Note (Signed)
Summary: [facility]   Imported By: Maryln Gottron 02/12/2010 11:05:55  _____________________________________________________________________  External Attachment:    Type:   Image     Comment:   External Document

## 2010-11-12 NOTE — Letter (Signed)
Summary: Rheumatology-Dr. Stacey Drain  Rheumatology-Dr. Stacey Drain   Imported By: Maryln Gottron 09/21/2010 09:55:25  _____________________________________________________________________  External Attachment:    Type:   Image     Comment:   External Document

## 2010-12-02 ENCOUNTER — Other Ambulatory Visit: Payer: Self-pay

## 2010-12-07 ENCOUNTER — Ambulatory Visit: Payer: Self-pay | Admitting: Family Medicine

## 2010-12-16 ENCOUNTER — Other Ambulatory Visit (INDEPENDENT_AMBULATORY_CARE_PROVIDER_SITE_OTHER): Payer: 59

## 2010-12-16 ENCOUNTER — Encounter (INDEPENDENT_AMBULATORY_CARE_PROVIDER_SITE_OTHER): Payer: Self-pay | Admitting: *Deleted

## 2010-12-16 ENCOUNTER — Other Ambulatory Visit: Payer: Self-pay | Admitting: Family Medicine

## 2010-12-16 DIAGNOSIS — E785 Hyperlipidemia, unspecified: Secondary | ICD-10-CM

## 2010-12-16 DIAGNOSIS — R972 Elevated prostate specific antigen [PSA]: Secondary | ICD-10-CM

## 2010-12-16 DIAGNOSIS — R7309 Other abnormal glucose: Secondary | ICD-10-CM

## 2010-12-16 LAB — HEMOGLOBIN A1C: Hgb A1c MFr Bld: 6.1 % (ref 4.6–6.5)

## 2010-12-16 LAB — ALT: ALT: 48 U/L (ref 0–53)

## 2010-12-16 LAB — PSA: PSA: 1.03 ng/mL (ref 0.10–4.00)

## 2010-12-16 LAB — AST: AST: 39 U/L — ABNORMAL HIGH (ref 0–37)

## 2010-12-22 ENCOUNTER — Ambulatory Visit (INDEPENDENT_AMBULATORY_CARE_PROVIDER_SITE_OTHER): Payer: 59 | Admitting: Family Medicine

## 2010-12-22 ENCOUNTER — Encounter: Payer: Self-pay | Admitting: Family Medicine

## 2010-12-22 DIAGNOSIS — R972 Elevated prostate specific antigen [PSA]: Secondary | ICD-10-CM

## 2010-12-22 DIAGNOSIS — I10 Essential (primary) hypertension: Secondary | ICD-10-CM

## 2010-12-22 DIAGNOSIS — R7309 Other abnormal glucose: Secondary | ICD-10-CM

## 2010-12-22 DIAGNOSIS — E785 Hyperlipidemia, unspecified: Secondary | ICD-10-CM

## 2010-12-22 DIAGNOSIS — M109 Gout, unspecified: Secondary | ICD-10-CM

## 2011-01-07 NOTE — Assessment & Plan Note (Signed)
Summary: 3 month follow up//rbh   Vital Signs:  Patient profile:   70 year old male Height:      71.25 inches Weight:      179.75 pounds BMI:     24.98 Temp:     98.1 degrees F oral Pulse rate:   64 / minute Pulse rhythm:   regular BP sitting:   160 / 92  (left arm) Cuff size:   large  Vitals Entered By: Lewanda Rife LPN (December 22, 2010 11:54 AM)  Serial Vital Signs/Assessments:  Time      Position  BP       Pulse  Resp  Temp     By                     165/85                         Judith Part MD  CC: three month f/u Comments Pt states he has been rushing this morning.Marland Kitchen   History of Present Illness: here for f/u of HTN and lipids and hyperglycemia and elevated psa  psa back to nl after resolution of infx is 1.03 down from over 3  160/92-- claims was rushig this am tough work am today   lipdis are much imp with trig 176 and HDL 52 and LDL 83 that is much better from trig over 400  diet is a little better   ast /alt 48/39- this is labile  sugar is 115 with AIC 6.1 diet - is not a sweet eater or starch eater  is really healthy does exercise regularly and mt his weight  no family hx of DM    in [place] had flu like symptoms  got worse in [place]--given abx and strong cough med finished his abx 2 wk ago  still a bit of cough - no other symptoms   bp great at home - usually 120s/80s--has whitecoat component   I will take over the allopurinol and gout care   Allergies (verified): No Known Drug Allergies  Past History:  Past Surgical History: Last updated: 11-13-07 Colonoscopy- polyps (1997,  03/1998), neg- no polyps (04/2002),  neg (10/2006) Hemorrhoidectomy (1997) EGD- gastritis, pos H pylori (04/2002)- tx  Family History: Last updated: Nov 13, 2007 Father: deceased MI 60 Mother: deceased, brain tumor and skin cancer- suspects melanoma Siblings: 1 sister  Social History: Last updated: 09/07/2010 Marital Status: Married From [place], [place] Children:  3 Occupation: Recruitment consultant occ alcohol   Risk Factors: Smoking Status: quit (06/08/2007)  Past Medical History: Skin cancer, hx of Gout Hyperlipidemia Hypertension  (some whitecoat component - better at home gastritis H pylori- partial tx deg disc dz -- with epidural inj   Review of Systems General:  Complains of fatigue; denies chills, fever, loss of appetite, and malaise. Eyes:  Denies blurring and eye irritation. Resp:  Complains of cough; denies pleuritic, shortness of breath, sputum productive, and wheezing. GI:  Denies abdominal pain, change in bowel habits, indigestion, and nausea. GU:  Denies urinary frequency and urinary hesitancy. MS:  Denies joint pain, muscle aches, cramps, and muscle weakness. Derm:  Denies itching, lesion(s), poor wound healing, and rash. Neuro:  Denies headaches, tingling, and tremors. Psych:  very stressed with work lately. Endo:  Denies cold intolerance, excessive thirst, excessive urination, and heat intolerance. Heme:  Denies abnormal bruising and bleeding.  Physical Exam  General:  Well-developed,well-nourished,in no acute distress; alert,appropriate and cooperative  throughout examination Head:  normocephalic, atraumatic, and no abnormalities observed.   Eyes:  vision grossly intact, pupils equal, pupils round, and pupils reactive to light.  no conjunctival pallor, injection or icterus  Mouth:  pharynx pink and moist.   Neck:  supple with full rom and no masses or thyromegally, no JVD or carotid bruit  Chest Wall:  No deformities, masses, tenderness or gynecomastia noted. Lungs:  Normal respiratory effort, chest expands symmetrically. Lungs are clear to auscultation, no crackles or wheezes. Heart:  Normal rate and regular rhythm. S1 and S2 normal without gallop, murmur, click, rub or other extra sounds. Abdomen:  Bowel sounds positive,abdomen soft and non-tender without masses, organomegaly or hernias noted.  no CVA  tenderness no renal bruits  Msk:  No deformity or scoliosis noted of thoracic or lumbar spine.  no acute joint changes or evidence of gout today Pulses:  R and L carotid,radial,femoral,dorsalis pedis and posterior tibial pulses are full and equal bilaterally Extremities:  No clubbing, cyanosis, edema, or deformity noted with normal full range of motion of all joints.   Neurologic:  sensation intact to light touch, gait normal, and DTRs symmetrical and normal.   Skin:  Intact without suspicious lesions or rashes Cervical Nodes:  No lymphadenopathy noted Inguinal Nodes:  No significant adenopathy Psych:  normal affect, talkative and pleasant    Impression & Recommendations:  Problem # 1:  PSA, INCREASED (ICD-790.93) Assessment Improved  this is much imp after bout of prostate infx is over  this is reasssuring  rev results with pt   Orders: Prescription Created Electronically [phone])  Problem # 2:  TRANSAMINASES, SERUM, ELEVATED (ICD-790.4) Assessment: Unchanged  overall stable disc avoiding fatty foods/ alcohol and otc med  Orders: Prescription Created Electronically [phone])  Problem # 3:  GOUT (ICD-274.9) Assessment: Unchanged  is stable so I will take over px the gout meds from Dr Kellie Simmering His updated medication list for this problem includes:    Allopurinol 300 Mg Tabs (Allopurinol) ..... One by mouth once daily  Orders: Prescription Created Electronically [phone])  Problem # 4:  HYPERTENSION (ICD-401.9) Assessment: Unchanged  this is very good at home-higher here and after stress tested cuff -- is fairly accurate- will continue to go by home bp His updated medication list for this problem includes:    Zebeta 5 Mg Tabs (Bisoprolol fumarate) .Marland Kitchen... Take 1 1/2 by mouth once a day    Lisinopril 20 Mg Tabs (Lisinopril) .Marland Kitchen... 1 by mouth once daily  BP today: 160/92 Prior BP: 148/84 (09/07/2010)  Labs Reviewed: K+: 4.1 (09/02/2010) Creat: : 0.78 (09/02/2010)   Chol:  171 (12/16/2010)   HDL: 52.80 (12/16/2010)   LDL: 83 (12/16/2010)   TG: 176.0 (12/16/2010)  Orders: Prescription Created Electronically [phone])  Problem # 5:  HYPERLIPIDEMIA (ICD-272.4) Assessment: Improved  trig much imp  may be due to sugar  disc diet change and continued exercise rev labs with pt   Labs Reviewed: SGOT: 39 (12/16/2010)   SGPT: 48 (12/16/2010)   HDL:52.80 (12/16/2010), 45 (09/02/2010)  LDL:83 (12/16/2010), See Comment mg/dL (38/75/6433)  IRJJ:884 (12/16/2010), 168 (09/02/2010)  Trig:176.0 (12/16/2010), 455 (09/02/2010)  Orders: Prescription Created Electronically [phone])  Complete Medication List: 1)  Zebeta 5 Mg Tabs (Bisoprolol fumarate) .... Take 1 1/2 by mouth once a day 2)  Allopurinol 300 Mg Tabs (Allopurinol) .... One by mouth once daily 3)  Lisinopril 20 Mg Tabs (Lisinopril) .Marland Kitchen.. 1 by mouth once daily 4)  Vitamin E  .... Take 1 tablet by  mouth once a day 5)  Calcium  .... Take 1 tablet by mouth once a day 6)  Fish Oil  .... Take 1 capsule by mouth once a day 7)  Vitamin C 1000 Mg Tabs (Ascorbic acid) .... Take 1 tablet by mouth once a day  Patient Instructions: 1)  home bp readings are good - I am reassured by that  2)  you have "white coat" syndrome- that means bp goes higher when stressed or in doctor's office 3)  cholesterol is improved 4)  psa is improved  5)  sugar is a bit high  6)  so keep watching sugar and starches in diet and keep exercising  7)  schedule 30 minute check up with labs prior in november please  8)  no change in medicines  Prescriptions: ALLOPURINOL 300 MG  TABS (ALLOPURINOL) one by mouth once daily  #30 x 11   Entered and Authorized by:   Judith Part MD   Signed by:   Judith Part MD on 12/22/2010   Method used:   Electronically to        [facility]. [address] [phone]* (retail)       [address] [place], [place]  [id number]       Ph: [phone]       Fax: [phone]   RxID:    [id number] LISINOPRIL 20 MG TABS (LISINOPRIL) 1 by mouth once daily  #30 x 11   Entered and Authorized by:   Judith Part MD   Signed by:   Judith Part MD on 12/22/2010   Method used:   Electronically to        [facility]. [address] [phone]* (retail)       [address] [place], [place]  [address]       Ph: [phone]       Fax: [phone]   RxID:   [id number] ZEBETA 5 MG TABS (BISOPROLOL FUMARATE) Take 1 1/2 by mouth once a day  #45 x 11   Entered and Authorized by:   Judith Part MD   Signed by:   Judith Part MD on 12/22/2010   Method used:   Electronically to        [facility]. [address] [phone]* (retail)       [address] [place], [place]  [id number]       Ph: [phone]       Fax: [phone]   RxID:   [id number]    Orders Added: 1)  Est. Patient Level IV [09381] 2)  Prescription Created Electronically [phone]    Current Allergies (reviewed today): No known allergies

## 2011-05-14 ENCOUNTER — Encounter: Payer: Self-pay | Admitting: Family Medicine

## 2011-05-14 ENCOUNTER — Ambulatory Visit (INDEPENDENT_AMBULATORY_CARE_PROVIDER_SITE_OTHER): Payer: 59 | Admitting: Family Medicine

## 2011-05-14 DIAGNOSIS — L989 Disorder of the skin and subcutaneous tissue, unspecified: Secondary | ICD-10-CM

## 2011-05-14 DIAGNOSIS — H00019 Hordeolum externum unspecified eye, unspecified eyelid: Secondary | ICD-10-CM

## 2011-05-14 MED ORDER — DOXYCYCLINE HYCLATE 100 MG PO TABS: 100.0000 mg | ORAL_TABLET | Freq: Two times a day (BID) | ORAL | Status: AC

## 2011-05-14 NOTE — Patient Instructions (Signed)
Start the antibiotics today and be careful about getting sunburned.  Take care.  Let me know if you are not improved.

## 2011-05-14 NOTE — Progress Notes (Signed)
Episodically with lumps/bumps on face.  They never open and drain.  Sometimes they get smaller, but they never go away.  Going on last few months.  No FCNAVD.  No ear pain.  No lesion in the mouth/nose.  The spots can be sore.  No lesions on trunk.    Now also  with R upper eyelid lesion.  No vision change.   Meds, vitals, and allergies reviewed.   ROS: See HPI.  Otherwise, noncontributory.  GEN: nad, alert and oriented HEENT: mucous membranes moist, tm wnl, nasal and oral exam wnl NECK: supple w/o LA SKIN: it feels like cystic lesions, some associated with follicular component in the skin, R>L side of face.   EYE: perrl, eomi, fundus wnl x2. No FB.  Normal lids except for stye on R upper outer lid

## 2011-05-15 DIAGNOSIS — H00019 Hordeolum externum unspecified eye, unspecified eyelid: Secondary | ICD-10-CM | POA: Insufficient documentation

## 2011-05-15 DIAGNOSIS — L989 Disorder of the skin and subcutaneous tissue, unspecified: Secondary | ICD-10-CM | POA: Insufficient documentation

## 2011-05-15 NOTE — Assessment & Plan Note (Signed)
Folliculitis vs inflamed cystic lesions.  Start doxy.  D/w pt about ddx, but I don't think these are skin cancers.  F/u prn.

## 2011-05-15 NOTE — Assessment & Plan Note (Signed)
Warm compress and then f/u prn.  He agrees.

## 2011-08-16 ENCOUNTER — Other Ambulatory Visit: Payer: 59

## 2011-08-17 ENCOUNTER — Telehealth: Payer: Self-pay | Admitting: Family Medicine

## 2011-08-17 DIAGNOSIS — I10 Essential (primary) hypertension: Secondary | ICD-10-CM

## 2011-08-17 DIAGNOSIS — E785 Hyperlipidemia, unspecified: Secondary | ICD-10-CM

## 2011-08-17 DIAGNOSIS — M109 Gout, unspecified: Secondary | ICD-10-CM

## 2011-08-17 DIAGNOSIS — D696 Thrombocytopenia, unspecified: Secondary | ICD-10-CM

## 2011-08-17 DIAGNOSIS — R7309 Other abnormal glucose: Secondary | ICD-10-CM

## 2011-08-17 DIAGNOSIS — R7402 Elevation of levels of lactic acid dehydrogenase (LDH): Secondary | ICD-10-CM

## 2011-08-17 DIAGNOSIS — R972 Elevated prostate specific antigen [PSA]: Secondary | ICD-10-CM

## 2011-08-17 NOTE — Telephone Encounter (Signed)
Message copied by Judy Pimple on Tue Aug 17, 2011  8:40 PM ------      Message from: Baldomero Lamy      Created: Tue Aug 17, 2011 10:29 AM      Regarding: Cpx labs Fri 11/9       Please order  future cpx labs for pt's upcomming lab appt.      Thanks      Rodney Booze

## 2011-08-20 ENCOUNTER — Other Ambulatory Visit (INDEPENDENT_AMBULATORY_CARE_PROVIDER_SITE_OTHER): Payer: 59

## 2011-08-20 ENCOUNTER — Encounter: Payer: Self-pay | Admitting: Family Medicine

## 2011-08-20 DIAGNOSIS — I10 Essential (primary) hypertension: Secondary | ICD-10-CM

## 2011-08-20 DIAGNOSIS — R7309 Other abnormal glucose: Secondary | ICD-10-CM

## 2011-08-20 DIAGNOSIS — M109 Gout, unspecified: Secondary | ICD-10-CM

## 2011-08-20 DIAGNOSIS — E785 Hyperlipidemia, unspecified: Secondary | ICD-10-CM

## 2011-08-20 DIAGNOSIS — R972 Elevated prostate specific antigen [PSA]: Secondary | ICD-10-CM

## 2011-08-20 DIAGNOSIS — D696 Thrombocytopenia, unspecified: Secondary | ICD-10-CM

## 2011-08-20 LAB — CBC WITH DIFFERENTIAL/PLATELET
Basophils Absolute: 0 10*3/uL (ref 0.0–0.1)
Eosinophils Absolute: 0.3 10*3/uL (ref 0.0–0.7)
Hemoglobin: 14.9 g/dL (ref 13.0–17.0)
Lymphocytes Relative: 25.7 % (ref 12.0–46.0)
Lymphs Abs: 1.2 10*3/uL (ref 0.7–4.0)
MCHC: 34.4 g/dL (ref 30.0–36.0)
Monocytes Relative: 8.2 % (ref 3.0–12.0)
Neutro Abs: 2.8 10*3/uL (ref 1.4–7.7)
Platelets: 122 10*3/uL — ABNORMAL LOW (ref 150.0–400.0)
RDW: 12.5 % (ref 11.5–14.6)

## 2011-08-20 LAB — PSA: PSA: 0.74 ng/mL (ref 0.10–4.00)

## 2011-08-20 LAB — LDL CHOLESTEROL, DIRECT: Direct LDL: 77.9 mg/dL

## 2011-08-20 LAB — COMPREHENSIVE METABOLIC PANEL
ALT: 40 U/L (ref 0–53)
AST: 36 U/L (ref 0–37)
Chloride: 103 mEq/L (ref 96–112)
Creatinine, Ser: 0.8 mg/dL (ref 0.4–1.5)
Sodium: 140 mEq/L (ref 135–145)
Total Bilirubin: 0.8 mg/dL (ref 0.3–1.2)
Total Protein: 6.3 g/dL (ref 6.0–8.3)

## 2011-08-20 LAB — TSH: TSH: 1.29 u[IU]/mL (ref 0.35–5.50)

## 2011-08-20 LAB — LIPID PANEL
HDL: 43.5 mg/dL (ref >39.00)
Total CHOL/HDL Ratio: 4
Triglycerides: 415 mg/dL — ABNORMAL HIGH (ref 0.0–149.0)

## 2011-08-23 ENCOUNTER — Encounter: Payer: 59 | Admitting: Family Medicine

## 2011-08-25 ENCOUNTER — Encounter: Payer: Self-pay | Admitting: Family Medicine

## 2011-08-25 ENCOUNTER — Ambulatory Visit (INDEPENDENT_AMBULATORY_CARE_PROVIDER_SITE_OTHER): Payer: 59 | Admitting: Family Medicine

## 2011-08-25 VITALS — BP 140/90 | HR 52 | Temp 97.7°F | Ht 71.5 in | Wt 181.2 lb

## 2011-08-25 DIAGNOSIS — D696 Thrombocytopenia, unspecified: Secondary | ICD-10-CM

## 2011-08-25 DIAGNOSIS — E785 Hyperlipidemia, unspecified: Secondary | ICD-10-CM

## 2011-08-25 DIAGNOSIS — Z23 Encounter for immunization: Secondary | ICD-10-CM

## 2011-08-25 DIAGNOSIS — I10 Essential (primary) hypertension: Secondary | ICD-10-CM

## 2011-08-25 DIAGNOSIS — R7309 Other abnormal glucose: Secondary | ICD-10-CM

## 2011-08-25 DIAGNOSIS — B354 Tinea corporis: Secondary | ICD-10-CM

## 2011-08-25 MED ORDER — KETOCONAZOLE 2 % EX CREA: TOPICAL_CREAM | Freq: Every day | CUTANEOUS | Status: DC

## 2011-08-25 NOTE — Patient Instructions (Addendum)
Schedule fasting labs in 3 months to re check cholesterol (triglycerides)- work on a lower fat diet  I'm glad you are back to exercise  Flu and pneumonia vaccines today  If you are interested in a shingles/zoster vaccine - call your insurance to check on coverage,( you should not get it within 1 month of other vaccines) , then call us for a prescription  for it to take to a pharmacy that gives the shot   for the fungal skin infection - please use the ketoconazole cream daily - if not cleared in 2-4 weeks call and I may want to use an oral medicine Try to keep the area as clean and dry as possible

## 2011-08-25 NOTE — Progress Notes (Signed)
Subjective:    Patient ID: Jeffrey [name], male    DOB: 23-Apr-1941, 70 y.o.   MRN: [id number]  HPI Here for check up of chronic medical problems and to rev health mt list  Feels good overall   Has been riding a bike with a small seat  Itchy area on buttocks  Using antifungal dry spray - helps the itch but not the mark   bp is 140/90 today  No change in med- doing fine  No cp or ha or edema  Wt is stable with bmi of 24  Gluc 120 and a1c 5.8- hyperglycemia in good control Continues to watch sugar in his diet  Wt is stable   Trig are up at 415- after going to [place] for a month , lots of eating high fat foods and did not exercise  HDL went down without exercising - is back to normal now  Trip was great  Lab Results  Component Value Date   CHOL 167 08/20/2011   CHOL 171 12/16/2010   CHOL 168 09/02/2010   Lab Results  Component Value Date   HDL 43.50 08/20/2011   HDL 40.98 12/16/2010   HDL 45 09/02/2010   Lab Results  Component Value Date   LDLCALC 83 12/16/2010   LDLCALC See Comment mg/dL 11/91/4782   LDLCALC 58 01/19/2010   Lab Results  Component Value Date   TRIG 415.0* 08/20/2011   TRIG 176.0* 12/16/2010   TRIG 455* 09/02/2010   Lab Results  Component Value Date   CHOLHDL 4 08/20/2011   CHOLHDL 3 12/16/2010   CHOLHDL 3.7 Ratio 09/02/2010   Lab Results  Component Value Date   LDLDIRECT 77.9 08/20/2011   LDLDIRECT 86.8 12/26/2008     Platelets up at 122-- went up a bit  No abn bruising or bleeding    psa is .74 ok No prostate issues since last visit  Nocturia - occ once at most  Did see urologist for infection - about 2 mo ago - did not do prostate exam   zost- has never had the dz or vaccine  Pneumovax--is interested  Flu shot- has not had -- is interested  Td 04  colonosc 08- will have 5 year follow up in 2013  Patient Active Problem List  Diagnoses  . HYPERLIPIDEMIA  . GOUT  . THROMBOCYTOPENIA  . HYPERTENSION  . HERNIATED DISC  . LUMBAR  RADICULOPATHY, RIGHT  . HYPERGLYCEMIA  . TRANSAMINASES, SERUM, ELEVATED  . PSA, INCREASED  . SKIN CANCER, HX OF  . Skin lesion  . Stye  . Tinea corporis   Past Medical History  Diagnosis Date  . History of skin cancer   . Gout   . Hyperlipidemia   . Hypertension     some white coat component- better at home  . Gastritis     H pylori, partial tx.  EGD 04/2002  . DDD (degenerative disc disease)     with epidural injection   Past Surgical History  Procedure Date  . Hemorrhoid surgery 1997   History  Substance Use Topics  . Smoking status: Never Smoker   . Smokeless tobacco: Not on file  . Alcohol Use: Yes     occasional   Family History  Problem Relation Age of Onset  . Cancer Mother     brain tumor, skin cancer- suspects melanoma  . Heart disease Father     MI   No Known Allergies Current Outpatient Prescriptions on File Prior to Visit  Medication Sig Dispense Refill  . allopurinol (ZYLOPRIM) 300 MG tablet Take 300 mg by mouth daily.        . Ascorbic Acid (VITAMIN C) 1000 MG tablet Take 1,000 mg by mouth daily.        . bisoprolol (ZEBETA) 5 MG tablet Take 5 mg by mouth daily.        Marland Kitchen CALCIUM PO Take by mouth daily.        Marland Kitchen lisinopril (PRINIVIL,ZESTRIL) 20 MG tablet Take 20 mg by mouth daily.        . Omega-3 Fatty Acids (FISH OIL PO) Take one capsule by mouth once a day       . VITAMIN E PO Take by mouth daily.             Review of Systems Review of Systems  Constitutional: Negative for fever, appetite change, fatigue and unexpected weight change.  Eyes: Negative for pain and visual disturbance.  Respiratory: Negative for cough and shortness of breath.   Cardiovascular: Negative for cp or palpitations    Gastrointestinal: Negative for nausea, diarrhea and constipation.  Genitourinary: Negative for urgency and frequency.  Skin: Negative for pallor and pos for rash  Neurological: Negative for weakness, light-headedness, numbness and headaches.    Hematological: Negative for adenopathy. Does not bruise/bleed easily.  Psychiatric/Behavioral: Negative for dysphoric mood. The patient is not nervous/anxious.          Objective:   Physical Exam  Constitutional: He appears well-developed and well-nourished. No distress.  HENT:  Head: Normocephalic and atraumatic.  Right Ear: External ear normal.  Left Ear: External ear normal.  Nose: Nose normal.  Mouth/Throat: Oropharynx is clear and moist.       Scant cerumen   Eyes: Conjunctivae and EOM are normal. Pupils are equal, round, and reactive to light. No scleral icterus.  Neck: Normal range of motion. Neck supple. No JVD present. Carotid bruit is not present. No thyromegaly present.  Cardiovascular: Normal rate, regular rhythm, normal heart sounds and intact distal pulses.  Exam reveals no gallop.   Pulmonary/Chest: Effort normal and breath sounds normal. He has no wheezes.  Abdominal: Soft. Bowel sounds are normal. He exhibits no distension, no abdominal bruit and no mass. There is no tenderness.  Genitourinary: Rectum normal and prostate normal.  Musculoskeletal: Normal range of motion. He exhibits no edema and no tenderness.  Lymphadenopathy:    He has no cervical adenopathy.  Neurological: He is alert. He has normal reflexes. No cranial nerve deficit. He exhibits normal muscle tone. Coordination normal.  Skin: Skin is warm and dry. No rash noted. No erythema. No pallor.       R upper post thigh/ buttock- large circumscribed area with raised borders and central clearing - pink in color  Resembles tinea  Some scale   Psychiatric: He has a normal mood and affect.          Assessment & Plan:

## 2011-08-25 NOTE — Assessment & Plan Note (Addendum)
Trig up after a trip out of the country with poor eating  Is back on track with diet and exercise  Disc goals for lipids and reasons to control them Rev labs with pt Rev low sat fat diet in detail  Will re check in 3 months and update

## 2011-08-25 NOTE — Assessment & Plan Note (Signed)
Stable/ mild with a1c under 6 Enc to keep working on low glycemic diet/ wt control and exercise In general - he does a great job with that

## 2011-08-25 NOTE — Assessment & Plan Note (Signed)
On L upper post thigh/ buttock- presume from riding bike/ damp area from sweating  Limited resp to otc spray Will try ketoconazole cream once daily - and update if not imp in 2-4 wk (would try oral agent) Disc keeping area as dry as possible

## 2011-08-26 NOTE — Assessment & Plan Note (Signed)
Improved without bleeding or bruising and platelet of 122 Will continue to monitor

## 2011-09-20 ENCOUNTER — Other Ambulatory Visit: Payer: Self-pay | Admitting: Family Medicine

## 2011-09-20 NOTE — Telephone Encounter (Signed)
Will refill electronically  

## 2011-09-20 NOTE — Telephone Encounter (Signed)
Okay to fill? 

## 2011-11-29 ENCOUNTER — Other Ambulatory Visit: Payer: 59

## 2011-12-06 ENCOUNTER — Other Ambulatory Visit: Payer: Self-pay | Admitting: Family Medicine

## 2011-12-07 NOTE — Telephone Encounter (Signed)
[facility] [place] request allopurinol 300 mg #90 x 0.

## 2011-12-27 ENCOUNTER — Other Ambulatory Visit (INDEPENDENT_AMBULATORY_CARE_PROVIDER_SITE_OTHER): Payer: 59

## 2011-12-27 DIAGNOSIS — E785 Hyperlipidemia, unspecified: Secondary | ICD-10-CM

## 2011-12-27 LAB — LIPID PANEL
HDL: 45.7 mg/dL (ref >39.00)
Total CHOL/HDL Ratio: 4
VLDL: 54.4 mg/dL — ABNORMAL HIGH (ref 0.0–40.0)

## 2011-12-29 ENCOUNTER — Ambulatory Visit: Payer: 59 | Admitting: Family Medicine

## 2011-12-30 ENCOUNTER — Ambulatory Visit (INDEPENDENT_AMBULATORY_CARE_PROVIDER_SITE_OTHER): Payer: 59 | Admitting: Family Medicine

## 2011-12-30 ENCOUNTER — Encounter: Payer: Self-pay | Admitting: Family Medicine

## 2011-12-30 VITALS — BP 120/80 | HR 56 | Temp 97.6°F | Ht 72.9 in | Wt 179.2 lb

## 2011-12-30 DIAGNOSIS — E785 Hyperlipidemia, unspecified: Secondary | ICD-10-CM

## 2011-12-30 DIAGNOSIS — I10 Essential (primary) hypertension: Secondary | ICD-10-CM

## 2011-12-30 MED ORDER — LISINOPRIL 20 MG PO TABS: 20.0000 mg | ORAL_TABLET | Freq: Every day | ORAL | Status: DC

## 2011-12-30 MED ORDER — ALLOPURINOL 300 MG PO TABS: 300.0000 mg | ORAL_TABLET | Freq: Every day | ORAL | Status: DC

## 2011-12-30 MED ORDER — BISOPROLOL FUMARATE 5 MG PO TABS: 5.0000 mg | ORAL_TABLET | Freq: Every day | ORAL | Status: DC

## 2011-12-30 NOTE — Assessment & Plan Note (Signed)
Triglycerides much improved with better diet- now not traveling Mildly high- will continue to follow (severely high in distant past) Enc further exercise Disc goals for lipids and reasons to control them Rev labs with pt Rev low sat fat diet in detail

## 2011-12-30 NOTE — Progress Notes (Signed)
Subjective:    Patient ID: Jeffrey [name], male    DOB: 05-30-1941, 71 y.o.   MRN: [id number]  HPI Here for f/u of hyperlipidemia and HTN  Wt is down 2 lb with bmi of 24- overall stable  Last visit chol up due to a trip  Today is improved  Lab Results  Component Value Date   CHOL 171 12/27/2011   CHOL 167 08/20/2011   CHOL 171 12/16/2010   Lab Results  Component Value Date   HDL 45.70 12/27/2011   HDL 37.90 08/20/2011   HDL 24.09 12/16/2010   Lab Results  Component Value Date   LDLCALC 83 12/16/2010   LDLCALC See Comment mg/dL 73/53/2992   LDLCALC 58 01/19/2010   Lab Results  Component Value Date   TRIG 272.0* 12/27/2011   TRIG 415.0* 08/20/2011   TRIG 176.0* 12/16/2010   Lab Results  Component Value Date   CHOLHDL 4 12/27/2011   CHOLHDL 4 08/20/2011   CHOLHDL 3 12/16/2010   Lab Results  Component Value Date   LDLDIRECT 84.9 12/27/2011   LDLDIRECT 77.9 08/20/2011   LDLDIRECT 86.8 12/26/2008    Trig down from 415 to 272- much imp  Years ago according to pt - was up into 800s  LDL 84 Diet is improved- got back to his usual diet- no fried food or red meat or fast foods   Was exercising regularly until he injured his R knee Is much better overall  Runs and bikes  Now stationary bike  It was a little - now back to normal   bp is   120/80  Today- very good  No cp or palpitations or headaches or edema  No side effects to medicines    Patient Active Problem List  Diagnoses  . HYPERLIPIDEMIA  . GOUT  . THROMBOCYTOPENIA  . HYPERTENSION  . HERNIATED DISC  . LUMBAR RADICULOPATHY, RIGHT  . HYPERGLYCEMIA  . TRANSAMINASES, SERUM, ELEVATED  . PSA, INCREASED  . SKIN CANCER, HX OF  . Skin lesion  . Stye  . Tinea corporis   Past Medical History  Diagnosis Date  . History of skin cancer   . Gout   . Hyperlipidemia   . Hypertension     some white coat component- better at home  . Gastritis     H pylori, partial tx.  EGD 04/2002  . DDD (degenerative disc disease)     with  epidural injection   Past Surgical History  Procedure Date  . Hemorrhoid surgery 1997   History  Substance Use Topics  . Smoking status: Never Smoker   . Smokeless tobacco: Not on file  . Alcohol Use: Yes     occasional   Family History  Problem Relation Age of Onset  . Cancer Mother     brain tumor, skin cancer- suspects melanoma  . Heart disease Father     MI   No Known Allergies Current Outpatient Prescriptions on File Prior to Visit  Medication Sig Dispense Refill  . Ascorbic Acid (VITAMIN C) 1000 MG tablet Take 1,000 mg by mouth daily.        Marland Kitchen CALCIUM PO Take by mouth daily.        . Calcium-Magnesium-Zinc 1000-400-15 MG TABS Take 1 tablet by mouth daily.        Marland Kitchen ketoconazole (NIZORAL) 2 % cream APPLY TOPICALLY TO THE AFFECTED AREA EVERY DAY  30 g  0  . Omega-3 Fatty Acids (FISH OIL PO) Take one capsule  by mouth once a day       . VITAMIN E PO Take by mouth daily.            Review of Systems Review of Systems  Constitutional: Negative for fever, appetite change, fatigue and unexpected weight change.  Eyes: Negative for pain and visual disturbance.  Respiratory: Negative for cough and shortness of breath.   Cardiovascular: Negative for cp or palpitations    Gastrointestinal: Negative for nausea, diarrhea and constipation.  Genitourinary: Negative for urgency and frequency.  Skin: Negative for pallor or rash   Neurological: Negative for weakness, light-headedness, numbness and headaches.  Hematological: Negative for adenopathy. Does not bruise/bleed easily.  Psychiatric/Behavioral: Negative for dysphoric mood. The patient is not nervous/anxious.          Objective:   Physical Exam  Constitutional: He appears well-developed and well-nourished. No distress.  HENT:  Head: Normocephalic and atraumatic.  Mouth/Throat: Oropharynx is clear and moist.  Eyes: Conjunctivae and EOM are normal. Pupils are equal, round, and reactive to light. No scleral icterus.  Neck:  Normal range of motion. Neck supple. No JVD present. Carotid bruit is not present. No thyromegaly present.  Cardiovascular: Normal rate, regular rhythm, normal heart sounds and intact distal pulses.  Exam reveals no gallop.   Pulmonary/Chest: Effort normal and breath sounds normal. No respiratory distress. He has no wheezes.  Lymphadenopathy:    He has no cervical adenopathy.  Neurological: He is alert. He has normal reflexes. No cranial nerve deficit. He exhibits normal muscle tone. Coordination normal.  Skin: Skin is warm and dry. No rash noted. No erythema. No pallor.  Psychiatric: He has a normal mood and affect.          Assessment & Plan:

## 2011-12-30 NOTE — Assessment & Plan Note (Signed)
bp in fair control at this time  No changes needed  Disc lifstyle change with low sodium diet and exercise   meds refilled Will f/u for annual exam in 6 months

## 2011-12-30 NOTE — Patient Instructions (Signed)
Cholesterol improved (triglycerides) Keep eating a low fat diet  Blood pressure is well controlled  Follow up in 6 months for annual exam with labs prior  Keep exercising -low impact if knee hurts

## 2012-04-28 ENCOUNTER — Encounter: Payer: Self-pay | Admitting: *Deleted

## 2012-05-23 ENCOUNTER — Encounter: Payer: Self-pay | Admitting: Gastroenterology

## 2012-07-03 ENCOUNTER — Ambulatory Visit: Payer: 59 | Admitting: Family Medicine

## 2012-07-03 DIAGNOSIS — Z0289 Encounter for other administrative examinations: Secondary | ICD-10-CM

## 2012-10-12 ENCOUNTER — Other Ambulatory Visit: Payer: Self-pay | Admitting: Family Medicine

## 2012-10-12 NOTE — Telephone Encounter (Signed)
No recent appt/ labs and no future appt, ok to refill?

## 2012-10-12 NOTE — Telephone Encounter (Signed)
Schedule f/u this spring and refil until then, thanks

## 2012-10-13 NOTE — Telephone Encounter (Signed)
Left voicemail requesting pt to call office, will try to call back later 

## 2012-10-16 NOTE — Telephone Encounter (Signed)
Left voicemail on work and home phone requesting pt to call office, will try to call back later

## 2012-10-18 ENCOUNTER — Other Ambulatory Visit: Payer: Self-pay

## 2012-10-18 MED ORDER — BISOPROLOL FUMARATE 5 MG PO TABS: 7.5000 mg | ORAL_TABLET | Freq: Every day | ORAL | Status: DC

## 2012-10-18 MED ORDER — BISOPROLOL FUMARATE 5 MG PO TABS: 5.0000 mg | ORAL_TABLET | Freq: Every day | ORAL | Status: DC

## 2012-10-18 NOTE — Telephone Encounter (Signed)
Pt calls and has been taking bisoprolol 5 mg and pt take 1 1/2 tab daily for 3 years. Med list takes one daily.Please advise.

## 2012-10-18 NOTE — Telephone Encounter (Signed)
[facility] [place]

## 2012-10-18 NOTE — Telephone Encounter (Signed)
I went ahead and sent electronically to pharmacy in [facility]

## 2012-10-18 NOTE — Telephone Encounter (Signed)
[facility] [place] request refill bisoprolol 5 mg # 90 x 0 with note pt needs to call for appt. Spoke with Unknown Jim at [facility] and she does not know why pt does not still have refills from 12/2011 rx/

## 2012-10-18 NOTE — Telephone Encounter (Signed)
I will change that px for call in  Please make him a f/u for spring or early summer

## 2012-10-18 NOTE — Addendum Note (Signed)
Addended by: Roxy Manns A on: 10/18/2012 05:07 PM   Modules accepted: Orders

## 2012-12-11 ENCOUNTER — Other Ambulatory Visit: Payer: Self-pay | Admitting: Family Medicine

## 2012-12-11 MED ORDER — ALLOPURINOL 300 MG PO TABS: 300.0000 mg | ORAL_TABLET | Freq: Every day | ORAL | Status: DC

## 2013-01-11 ENCOUNTER — Encounter: Payer: Self-pay | Admitting: Gastroenterology

## 2013-05-22 ENCOUNTER — Other Ambulatory Visit: Payer: Self-pay | Admitting: *Deleted

## 2013-05-22 MED ORDER — LISINOPRIL 20 MG PO TABS: ORAL_TABLET | ORAL | Status: DC

## 2013-07-10 ENCOUNTER — Telehealth: Payer: Self-pay | Admitting: Family Medicine

## 2013-07-10 DIAGNOSIS — M109 Gout, unspecified: Secondary | ICD-10-CM

## 2013-07-10 DIAGNOSIS — I10 Essential (primary) hypertension: Secondary | ICD-10-CM

## 2013-07-10 DIAGNOSIS — D696 Thrombocytopenia, unspecified: Secondary | ICD-10-CM

## 2013-07-10 DIAGNOSIS — R7309 Other abnormal glucose: Secondary | ICD-10-CM

## 2013-07-10 DIAGNOSIS — E785 Hyperlipidemia, unspecified: Secondary | ICD-10-CM

## 2013-07-10 DIAGNOSIS — R972 Elevated prostate specific antigen [PSA]: Secondary | ICD-10-CM

## 2013-07-10 NOTE — Telephone Encounter (Signed)
Message copied by Judy Pimple on Tue Jul 10, 2013  8:06 AM ------      Message from: Alvina Chou      Created: Tue Jul 03, 2013  4:13 PM      Regarding: Lab orders for Wednesday, 10.1.14       Patient is scheduled for CPX labs, please order future labs, Thanks , Terri       ------

## 2013-07-11 ENCOUNTER — Other Ambulatory Visit (INDEPENDENT_AMBULATORY_CARE_PROVIDER_SITE_OTHER): Payer: No Typology Code available for payment source

## 2013-07-11 DIAGNOSIS — R7401 Elevation of levels of liver transaminase levels: Secondary | ICD-10-CM

## 2013-07-11 DIAGNOSIS — R7309 Other abnormal glucose: Secondary | ICD-10-CM

## 2013-07-11 DIAGNOSIS — I10 Essential (primary) hypertension: Secondary | ICD-10-CM

## 2013-07-11 DIAGNOSIS — M109 Gout, unspecified: Secondary | ICD-10-CM

## 2013-07-11 DIAGNOSIS — R972 Elevated prostate specific antigen [PSA]: Secondary | ICD-10-CM

## 2013-07-11 DIAGNOSIS — E785 Hyperlipidemia, unspecified: Secondary | ICD-10-CM

## 2013-07-11 DIAGNOSIS — D696 Thrombocytopenia, unspecified: Secondary | ICD-10-CM

## 2013-07-11 LAB — CBC WITH DIFFERENTIAL/PLATELET
Basophils Absolute: 0 10*3/uL (ref 0.0–0.1)
Eosinophils Absolute: 0.4 10*3/uL (ref 0.0–0.7)
HCT: 43.5 % (ref 39.0–52.0)
Lymphs Abs: 1.2 10*3/uL (ref 0.7–4.0)
MCV: 95 fl (ref 78.0–100.0)
Monocytes Absolute: 0.4 10*3/uL (ref 0.1–1.0)
Monocytes Relative: 6.8 % (ref 3.0–12.0)
Platelets: 115 10*3/uL — ABNORMAL LOW (ref 150.0–400.0)
RDW: 12.8 % (ref 11.5–14.6)

## 2013-07-11 LAB — COMPREHENSIVE METABOLIC PANEL
ALT: 35 U/L (ref 0–53)
AST: 31 U/L (ref 0–37)
Alkaline Phosphatase: 44 U/L (ref 39–117)
CO2: 28 mEq/L (ref 19–32)
Calcium: 9.2 mg/dL (ref 8.4–10.5)
Creatinine, Ser: 0.8 mg/dL (ref 0.4–1.5)
Potassium: 4.1 mEq/L (ref 3.5–5.1)
Sodium: 139 mEq/L (ref 135–145)
Total Bilirubin: 0.7 mg/dL (ref 0.3–1.2)
Total Protein: 6.2 g/dL (ref 6.0–8.3)

## 2013-07-11 LAB — LIPID PANEL
Cholesterol: 176 mg/dL (ref 0–200)
HDL: 49.2 mg/dL (ref >39.00)
Total CHOL/HDL Ratio: 4
VLDL: 52.2 mg/dL — ABNORMAL HIGH (ref 0.0–40.0)

## 2013-07-11 LAB — URIC ACID: Uric Acid, Serum: 4.6 mg/dL (ref 4.0–7.8)

## 2013-07-11 LAB — HEMOGLOBIN A1C: Hgb A1c MFr Bld: 6 % (ref 4.6–6.5)

## 2013-07-11 LAB — LDL CHOLESTEROL, DIRECT: Direct LDL: 86 mg/dL

## 2013-07-12 ENCOUNTER — Other Ambulatory Visit: Payer: Self-pay | Admitting: *Deleted

## 2013-07-12 MED ORDER — ALLOPURINOL 300 MG PO TABS: 300.0000 mg | ORAL_TABLET | Freq: Every day | ORAL | Status: DC

## 2013-08-01 ENCOUNTER — Encounter: Payer: Self-pay | Admitting: Family Medicine

## 2013-08-01 ENCOUNTER — Ambulatory Visit (INDEPENDENT_AMBULATORY_CARE_PROVIDER_SITE_OTHER): Payer: No Typology Code available for payment source | Admitting: Family Medicine

## 2013-08-01 VITALS — BP 148/94 | HR 56 | Temp 97.6°F | Ht 71.5 in | Wt 178.8 lb

## 2013-08-01 DIAGNOSIS — E785 Hyperlipidemia, unspecified: Secondary | ICD-10-CM

## 2013-08-01 DIAGNOSIS — Z1211 Encounter for screening for malignant neoplasm of colon: Secondary | ICD-10-CM | POA: Insufficient documentation

## 2013-08-01 DIAGNOSIS — D696 Thrombocytopenia, unspecified: Secondary | ICD-10-CM

## 2013-08-01 DIAGNOSIS — Z800 Family history of malignant neoplasm of digestive organs: Secondary | ICD-10-CM | POA: Insufficient documentation

## 2013-08-01 DIAGNOSIS — Z23 Encounter for immunization: Secondary | ICD-10-CM

## 2013-08-01 DIAGNOSIS — I10 Essential (primary) hypertension: Secondary | ICD-10-CM

## 2013-08-01 DIAGNOSIS — Z0000 Encounter for general adult medical examination without abnormal findings: Secondary | ICD-10-CM | POA: Insufficient documentation

## 2013-08-01 DIAGNOSIS — R7309 Other abnormal glucose: Secondary | ICD-10-CM

## 2013-08-01 MED ORDER — ALLOPURINOL 300 MG PO TABS: 300.0000 mg | ORAL_TABLET | Freq: Every day | ORAL | Status: DC

## 2013-08-01 MED ORDER — LISINOPRIL 20 MG PO TABS: ORAL_TABLET | ORAL | Status: DC

## 2013-08-01 MED ORDER — ZOSTER VACCINE LIVE 19400 UNT/0.65ML ~~LOC~~ SOLR: 0.6500 mL | Freq: Once | SUBCUTANEOUS | Status: DC

## 2013-08-01 MED ORDER — BISOPROLOL FUMARATE 5 MG PO TABS: 7.5000 mg | ORAL_TABLET | Freq: Every day | ORAL | Status: DC

## 2013-08-01 NOTE — Patient Instructions (Signed)
Follow up with me in about 3 months and bring your blood pressure cuff with you  Tetanus and flu shots today  Here is a px for shingles vaccine to take to [facility] in a month or more  We will do a colonoscopy referral at check out

## 2013-08-01 NOTE — Progress Notes (Signed)
Subjective:    Patient ID: Jeffrey [name], male    DOB: 1941-06-03, 72 y.o.   MRN: [id number]  HPI I have personally reviewed the  Annual Wellness questionnaire and have noted 1. The patient's medical and social history 2. Their use of alcohol, tobacco or illicit drugs 3. Their current medications and supplements 4. The patient's functional ability including ADL's, fall risks, home safety risks and hearing or visual             impairment. 5. Diet and physical activities 6. Evidence for depression or mood disorders  The patients weight, height, BMI have been recorded in the chart and visual acuity is per eye clinic.  I have made referrals, counseling and provided education to the patient based review of the above and I have provided the pt with a written personalized care plan for preventive services. Has been feeling ok  Takes care of himself and exercises regularly  Wt is stable with bmi of 24   See scanned forms.  Routine anticipatory guidance given to patient.  See health maintenance. Flu- has not had the vaccine yet - has a mild cold today Shingles-has not had the vaccine - is interested in vaccine - will get it at [facility] this winter- just needs the pz  PNA 11/12 vaccine  Tetanus - due for- will get that today Colonoscopy 2008 - has a 5 year recall ? Due to family history  -- sister had colon cancer - just dx   Prostate cancer screening- nl psa-that is stable  No prostate problems -nocturia times one only-no change , and no problems with urine stream  No prostate cancer in family Advance directive-has a living will already Cognitive function addressed- see scanned forms- and if abnormal then additional documentation follows. -no memory problems   PMH and SH reviewed  Meds, vitals, and allergies reviewed.   ROS: See HPI.  Otherwise negative.    Hx of low platelet Lab Results  Component Value Date   WBC 5.2 07/11/2013   HGB 15.0 07/11/2013   HCT 43.5 07/11/2013   MCV  95.0 07/11/2013   PLT 115.0* 07/11/2013    Overall stable -no problems with bleeding or bruising   bp is up a bit  today - he was upset at work this am  No cp or palpitations or headaches or edema  No side effects to medicines  BP Readings from Last 3 Encounters:  08/01/13 148/94  12/30/11 120/80  08/25/11 140/90    Blood pressures are excellent at home      Chemistry      Component Value Date/Time   NA 139 07/11/2013 0857   NA 142 05/15/2009 0819   K 4.1 07/11/2013 0857   K 3.9 05/15/2009 0819   CL 104 07/11/2013 0857   CL 103 05/15/2009 0819   CO2 28 07/11/2013 0857   CO2 28 05/15/2009 0819   BUN 16 07/11/2013 0857   BUN 12 05/15/2009 0819   CREATININE 0.8 07/11/2013 0857   CREATININE 0.8 05/15/2009 0819      Component Value Date/Time   CALCIUM 9.2 07/11/2013 0857   CALCIUM 8.7 05/15/2009 0819   ALKPHOS 44 07/11/2013 0857   ALKPHOS 40 05/15/2009 0819   AST 31 07/11/2013 0857   AST 44* 05/15/2009 0819   ALT 35 07/11/2013 0857   ALT 53* 05/15/2009 0819   BILITOT 0.7 07/11/2013 0857   BILITOT 0.50 05/15/2009 0819        Lipids Lab Results  Component Value Date   CHOL 176 07/11/2013   CHOL 171 12/27/2011   CHOL 167 08/20/2011   Lab Results  Component Value Date   HDL 49.20 07/11/2013   HDL 14.78 12/27/2011   HDL 29.56 08/20/2011   Lab Results  Component Value Date   LDLCALC 83 12/16/2010   LDLCALC See Comment mg/dL 21/30/8657   LDLCALC 58 01/19/2010   Lab Results  Component Value Date   TRIG 261.0* 07/11/2013   TRIG 272.0* 12/27/2011   TRIG 415.0* 08/20/2011   Lab Results  Component Value Date   CHOLHDL 4 07/11/2013   CHOLHDL 4 12/27/2011   CHOLHDL 4 08/20/2011   Lab Results  Component Value Date   LDLDIRECT 86.0 07/11/2013   LDLDIRECT 84.9 12/27/2011   LDLDIRECT 77.9 08/20/2011   per pt years ago - his trig were as high as 800 -- controls with diet   Hyperglycemia A1c is up from 5.8 to 6 Diet - is good -no sweets  Liver function tests are good also  Lab Results  Component Value  Date   PSA 1.00 07/11/2013   PSA 0.74 08/20/2011   PSA 1.03 12/16/2010      Patient Active Problem List   Diagnosis Date Noted  . Tinea corporis 08/25/2011  . Skin lesion 05/15/2011  . Stye 05/15/2011  . PSA, INCREASED 09/07/2010  . 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 THROMBOCYTOPENIA 01/16/2009  . TRANSAMINASES, SERUM, ELEVATED 10/26/2007  . HYPERLIPIDEMIA 06/08/2007  . GOUT 06/08/2007  . HYPERTENSION 06/08/2007  . HYPERGLYCEMIA 06/08/2007  . SKIN CANCER, HX OF 06/08/2007   Past Medical History  Diagnosis Date  . History of skin cancer   . Gout   . Hyperlipidemia   . Hypertension     some white coat component- better at home  . Gastritis     H pylori, partial tx.  EGD 04/2002  . DDD (degenerative disc disease)     with epidural injection   Past Surgical History  Procedure Laterality Date  . Hemorrhoid surgery  1997   History  Substance Use Topics  . Smoking status: Never Smoker   . Smokeless tobacco: Not on file  . Alcohol Use: Yes     Comment: occasional   Family History  Problem Relation Age of Onset  . Other Mother     brain tumor  . Heart disease Father     MI  . Skin cancer Mother     suspect melanoma   No Known Allergies Current Outpatient Prescriptions on File Prior to Visit  Medication Sig Dispense Refill  . allopurinol (ZYLOPRIM) 300 MG tablet Take 1 tablet (300 mg total) by mouth daily.  90 tablet  0  . Ascorbic Acid (VITAMIN C) 1000 MG tablet Take 1,000 mg by mouth daily.        . bisoprolol (ZEBETA) 5 MG tablet Take 1.5 tablets (7.5 mg total) by mouth daily.  135 tablet  1  . CALCIUM PO Take by mouth daily.        . Calcium-Magnesium-Zinc 1000-400-15 MG TABS Take 1 tablet by mouth daily.        Marland Kitchen lisinopril (PRINIVIL,ZESTRIL) 20 MG tablet TAKE 1 TABLET BY MOUTH DAILY  90 tablet  0  . Omega-3 Fatty Acids (FISH OIL PO) Take one capsule by mouth once a day       . VITAMIN E PO Take by mouth daily.         No  current  facility-administered medications on file prior to visit.    Review of Systems Review of Systems  Constitutional: Negative for fever, appetite change, fatigue and unexpected weight change.  Eyes: Negative for pain and visual disturbance.  Respiratory: Negative for cough and shortness of breath.   Cardiovascular: Negative for cp or palpitations    Gastrointestinal: Negative for nausea, diarrhea and constipation.  Genitourinary: Negative for urgency and frequency.  Skin: Negative for pallor or rash  neg for excessive bleeding or bruising  Neurological: Negative for weakness, light-headedness, numbness and headaches.  Hematological: Negative for adenopathy. Does not bruise/bleed easily.  Psychiatric/Behavioral: Negative for dysphoric mood. The patient is not nervous/anxious.         Objective:   Physical Exam  Constitutional: He is oriented to person, place, and time. He appears well-developed and well-nourished. No distress.  HENT:  Head: Normocephalic and atraumatic.  Right Ear: External ear normal.  Left Ear: External ear normal.  Nose: Nose normal.  Mouth/Throat: Oropharynx is clear and moist.  Eyes: Conjunctivae and EOM are normal. Pupils are equal, round, and reactive to light. Right eye exhibits no discharge. Left eye exhibits no discharge. No scleral icterus.  Neck: Normal range of motion. Neck supple. No JVD present. Carotid bruit is not present. No thyromegaly present.  Cardiovascular: Normal rate, regular rhythm, normal heart sounds and intact distal pulses.  Exam reveals no gallop.   Pulmonary/Chest: Effort normal and breath sounds normal. No respiratory distress. He has no wheezes. He has no rales.  Abdominal: Soft. Bowel sounds are normal. He exhibits no distension, no abdominal bruit and no mass. There is no tenderness.  Musculoskeletal: Normal range of motion. He exhibits no edema and no tenderness.  Lymphadenopathy:    He has no cervical adenopathy.   Neurological: He is alert and oriented to person, place, and time. He has normal reflexes. No cranial nerve deficit. He exhibits normal muscle tone. Coordination normal.  Skin: Skin is warm and dry. No rash noted. No erythema. No pallor.  Tanned with lentigos   Psychiatric: He has a normal mood and affect.          Assessment & Plan:

## 2013-08-02 ENCOUNTER — Encounter: Payer: Self-pay | Admitting: Family Medicine

## 2013-08-02 NOTE — Assessment & Plan Note (Signed)
Overall stable with no bleeding or bruising  Will continue to follow

## 2013-08-02 NOTE — Assessment & Plan Note (Signed)
bp is up a bit - but ok at home F/u 3 mo with cuff to re check  Lab rev

## 2013-08-02 NOTE — Assessment & Plan Note (Signed)
Lab Results  Component Value Date   HGBA1C 6.0 07/11/2013   Pt will continue to exercise and avoid sugars as well as he can to avoid DM

## 2013-08-02 NOTE — Assessment & Plan Note (Signed)
Disc goals for lipids and reasons to control them Rev labs with pt Rev low sat fat diet in detail   

## 2013-08-02 NOTE — Assessment & Plan Note (Signed)
New dx in sibling Ref for screening colonosc

## 2013-08-02 NOTE — Assessment & Plan Note (Signed)
Recall changed to 5 from 10 y due to fam hx  Ref for colonoscopy

## 2013-08-02 NOTE — Assessment & Plan Note (Signed)
Reviewed health habits including diet and exercise and skin cancer prevention Also reviewed health mt list, fam hx and immunizations  See HPI imms today Urged to keep up excellent self care

## 2013-08-08 ENCOUNTER — Other Ambulatory Visit: Payer: Self-pay | Admitting: Family Medicine

## 2013-11-02 ENCOUNTER — Ambulatory Visit: Payer: No Typology Code available for payment source | Admitting: Family Medicine

## 2013-11-21 ENCOUNTER — Encounter: Payer: Self-pay | Admitting: Family Medicine

## 2013-11-21 ENCOUNTER — Ambulatory Visit (INDEPENDENT_AMBULATORY_CARE_PROVIDER_SITE_OTHER): Payer: No Typology Code available for payment source | Admitting: Family Medicine

## 2013-11-21 VITALS — BP 126/76 | HR 49 | Temp 97.7°F | Ht 71.5 in | Wt 181.8 lb

## 2013-11-21 DIAGNOSIS — I10 Essential (primary) hypertension: Secondary | ICD-10-CM

## 2013-11-21 DIAGNOSIS — R7309 Other abnormal glucose: Secondary | ICD-10-CM

## 2013-11-21 NOTE — Progress Notes (Signed)
Pre-visit discussion using our clinic review tool. No additional management support is needed unless otherwise documented below in the visit note.  

## 2013-11-21 NOTE — Progress Notes (Signed)
Subjective:    Patient ID: Jeffrey [name], male    DOB: 09/24/1941, 73 y.o.   MRN: [id number]  HPI Here for f/u of HTN   Nothing new going on   bp is stable today  No cp or palpitations or headaches or edema  No side effects to medicines  BP Readings from Last 3 Encounters:  11/21/13 126/76  08/01/13 148/94  12/30/11 120/80      bp on pt's machine today 153/83 Re checked by myself in the same arm 125/80   On Monday at [place] [facility] 160/83  Has not had his colonoscopy yet -getting ready to do that    His machine- home readings 120-140/ 70-80s   He did get a glucose machine - with hyperglycemia - checks at different times - and generally sugar in 80s-90s  He is careful about sugar in diet  And rusn 2-3 miles every other day   Patient Active Problem List   Diagnosis Date Noted  . Family history of colon cancer 08/01/2013  . Colon cancer screening 08/01/2013  . Routine general medical examination at a health care facility 08/01/2013  . Tinea corporis 08/25/2011  . Skin lesion 05/15/2011  . PSA, INCREASED 09/07/2010  . 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 THROMBOCYTOPENIA 01/16/2009  . TRANSAMINASES, SERUM, ELEVATED 10/26/2007  . HYPERLIPIDEMIA 06/08/2007  . GOUT 06/08/2007  . HYPERTENSION 06/08/2007  . HYPERGLYCEMIA 06/08/2007  . SKIN CANCER, HX OF 06/08/2007   Past Medical History  Diagnosis Date  . History of skin cancer   . Gout   . Hyperlipidemia   . Hypertension     some white coat component- better at home  . Gastritis     H pylori, partial tx.  EGD 04/2002  . DDD (degenerative disc disease)     with epidural injection   Past Surgical History  Procedure Laterality Date  . Hemorrhoid surgery  1997   History  Substance Use Topics  . Smoking status: Never Smoker   . Smokeless tobacco: Not on file  . Alcohol Use: Yes     Comment: occasional   Family History  Problem Relation Age of Onset  . Other Mother    brain tumor  . Skin cancer Mother     suspect melanoma  . Heart disease Father     MI  . Colon cancer Sister    No Known Allergies Current Outpatient Prescriptions on File Prior to Visit  Medication Sig Dispense Refill  . allopurinol (ZYLOPRIM) 300 MG tablet Take 1 tablet (300 mg total) by mouth daily.  90 tablet  3  . Ascorbic Acid (VITAMIN C) 1000 MG tablet Take 1,000 mg by mouth daily.        . bisoprolol (ZEBETA) 5 MG tablet Take 1.5 tablets (7.5 mg total) by mouth daily.  135 tablet  3  . CALCIUM PO Take by mouth daily.        . Calcium-Magnesium-Zinc 1000-400-15 MG TABS Take 1 tablet by mouth daily.        Marland Kitchen lisinopril (PRINIVIL,ZESTRIL) 20 MG tablet TAKE 1 TABLET BY MOUTH DAILY  90 tablet  3  . Omega-3 Fatty Acids (FISH OIL PO) Take one capsule by mouth once a day       . VITAMIN E PO Take by mouth daily.        Marland Kitchen zoster vaccine live, PF, (ZOSTAVAX) 76546 UNT/0.65ML injection Inject 19,400 Units into the skin once.  1 vial  0   No current facility-administered medications on file prior to visit.        Review of Systems Review of Systems  Constitutional: Negative for fever, appetite change, fatigue and unexpected weight change.  Eyes: Negative for pain and visual disturbance.  Respiratory: Negative for cough and shortness of breath.   Cardiovascular: Negative for cp or palpitations    Gastrointestinal: Negative for nausea, diarrhea and constipation. pos for gurgling stomach if he eats too late at night Genitourinary: Negative for urgency and frequency.  Skin: Negative for pallor or rash   Neurological: Negative for weakness, light-headedness, numbness and headaches.  Hematological: Negative for adenopathy. Does not bruise/bleed easily.  Psychiatric/Behavioral: Negative for dysphoric mood. The patient is not nervous/anxious.         Objective:   Physical Exam  Constitutional: He appears well-developed and well-nourished. No distress.  HENT:  Head: Normocephalic.    Eyes: Conjunctivae and EOM are normal. Pupils are equal, round, and reactive to light. No scleral icterus.  Neck: Normal range of motion. Neck supple. No JVD present. Carotid bruit is not present. No thyromegaly present.  Cardiovascular: Normal rate, regular rhythm and intact distal pulses.  Exam reveals no gallop.   Pulmonary/Chest: Effort normal and breath sounds normal. No respiratory distress. He has no wheezes. He has no rales.  Musculoskeletal: He exhibits no edema.  Lymphadenopathy:    He has no cervical adenopathy.  Neurological: He is alert.  Skin: Skin is warm and dry. No rash noted.  Psychiatric: He has a normal mood and affect.          Assessment & Plan:

## 2013-11-21 NOTE — Patient Instructions (Signed)
Your blood pressure is reassuring today Your cuff may overestimate blood pressure a bit  Let's keep an eye on it  Keep up the good diet and exercise  If your blood pressure stays over 140/90- let me know

## 2013-11-22 ENCOUNTER — Telehealth: Payer: Self-pay | Admitting: Family Medicine

## 2013-11-22 NOTE — Assessment & Plan Note (Signed)
Pt has watched glucose at home and it is running normally  Urged to keep up good diet and exercise and wt control

## 2013-11-22 NOTE — Assessment & Plan Note (Signed)
bp is improved here- and his cuff may overestimate bp at home (where it is lower by his readings)  Disc diet/ exercise/ DASH diet - he is compliant  Will continue to watch

## 2013-11-22 NOTE — Telephone Encounter (Signed)
Relevant patient education assigned to patient using Emmi. ° °

## 2013-12-31 ENCOUNTER — Encounter: Payer: Self-pay | Admitting: Family Medicine

## 2014-06-05 ENCOUNTER — Encounter: Payer: Self-pay | Admitting: Family Medicine

## 2014-06-05 ENCOUNTER — Ambulatory Visit (INDEPENDENT_AMBULATORY_CARE_PROVIDER_SITE_OTHER): Payer: No Typology Code available for payment source | Admitting: Family Medicine

## 2014-06-05 ENCOUNTER — Ambulatory Visit (INDEPENDENT_AMBULATORY_CARE_PROVIDER_SITE_OTHER)
Admission: RE | Admit: 2014-06-05 | Discharge: 2014-06-05 | Disposition: A | Discharge status: Home / Self Care | Payer: No Typology Code available for payment source | Source: Ambulatory Visit | Attending: Family Medicine | Admitting: Family Medicine

## 2014-06-05 VITALS — BP 122/82 | HR 50 | Temp 97.5°F | Ht 71.5 in | Wt 178.8 lb

## 2014-06-05 DIAGNOSIS — R109 Unspecified abdominal pain: Secondary | ICD-10-CM

## 2014-06-05 DIAGNOSIS — R1032 Left lower quadrant pain: Secondary | ICD-10-CM

## 2014-06-05 DIAGNOSIS — R1030 Lower abdominal pain, unspecified: Secondary | ICD-10-CM

## 2014-06-05 LAB — POCT URINALYSIS DIPSTICK
Bilirubin, UA: NEGATIVE
Glucose, UA: NEGATIVE
KETONES UA: NEGATIVE
Leukocytes, UA: NEGATIVE
Nitrite, UA: NEGATIVE
RBC UA: NEGATIVE
Urobilinogen, UA: 0.2
pH, UA: 6

## 2014-06-05 NOTE — Assessment & Plan Note (Signed)
No signs of hernia  No urinary symptoms  Does corresp with bm / and caliber had changed (Nl colonosc with 2 polyp in Feb)  ? Poss constipation or diverticulosis abd xray Heme cards times 3  Start fiber daily/citrucel  Update if not starting to improve in a week or if worsening

## 2014-06-05 NOTE — Progress Notes (Signed)
Subjective:    Patient ID: Jeffrey [name], male    DOB: July 08, 1941, 73 y.o.   MRN: [id number]  HPI The last 2-3 weeks - has had pain on and off on the L (LLQ) Stool is darker in color and "skinny"  No blood in stool  No constipation  No diarrhea  No upper abd pain  No n/v  No fever   Feels fine  Eating like usual Good appetite    colonosc 2/15 -with 2 polyps   Has family history of colon cancer   Results for orders placed in visit on 06/05/14  POCT URINALYSIS DIPSTICK      Result Value Ref Range   Color, UA dark yellow     Clarity, UA clear     Glucose, UA neg.     Bilirubin, UA neg.     Ketones, UA neg.     Spec Grav, UA >=1.030     Blood, UA neg.     pH, UA 6.0     Protein, UA Trace     Urobilinogen, UA 0.2     Nitrite, UA neg.     Leukocytes, UA Negative       Patient Active Problem List   Diagnosis Date Noted  . Abdominal pain, LLQ 06/05/2014  . Family history of colon cancer 08/01/2013  . Colon cancer screening 08/01/2013  . Routine general medical examination at a health care facility 08/01/2013  . PSA, INCREASED 09/07/2010  . 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 THROMBOCYTOPENIA 01/16/2009  . TRANSAMINASES, SERUM, ELEVATED 10/26/2007  . HYPERLIPIDEMIA 06/08/2007  . GOUT 06/08/2007  . HYPERTENSION 06/08/2007  . HYPERGLYCEMIA 06/08/2007  . SKIN CANCER, HX OF 06/08/2007   Past Medical History  Diagnosis Date  . History of skin cancer   . Gout   . Hyperlipidemia   . Hypertension     some white coat component- better at home  . Gastritis     H pylori, partial tx.  EGD 04/2002  . DDD (degenerative disc disease)     with epidural injection   Past Surgical History  Procedure Laterality Date  . Hemorrhoid surgery  1997   History  Substance Use Topics  . Smoking status: Never Smoker   . Smokeless tobacco: Not on file  . Alcohol Use: Yes     Comment: occasional   Family History  Problem Relation Age of  Onset  . Other Mother     brain tumor  . Skin cancer Mother     suspect melanoma  . Heart disease Father     MI  . Colon cancer Sister    No Known Allergies Current Outpatient Prescriptions on File Prior to Visit  Medication Sig Dispense Refill  . allopurinol (ZYLOPRIM) 300 MG tablet Take 1 tablet (300 mg total) by mouth daily.  90 tablet  3  . Ascorbic Acid (VITAMIN C) 1000 MG tablet Take 1,000 mg by mouth daily.        . bisoprolol (ZEBETA) 5 MG tablet Take 1.5 tablets (7.5 mg total) by mouth daily.  135 tablet  3  . CALCIUM PO Take by mouth daily.        . Calcium-Magnesium-Zinc 1000-400-15 MG TABS Take 1 tablet by mouth daily.        Marland Kitchen lisinopril (PRINIVIL,ZESTRIL) 20 MG tablet TAKE 1 TABLET BY MOUTH DAILY  90 tablet  3  . Omega-3 Fatty Acids (FISH OIL PO) Take one capsule by mouth  once a day       . VITAMIN E PO Take by mouth daily.        Marland Kitchen zoster vaccine live, PF, (ZOSTAVAX) 69450 UNT/0.65ML injection Inject 19,400 Units into the skin once.  1 vial  0   No current facility-administered medications on file prior to visit.    Review of Systems    Review of Systems  Constitutional: Negative for fever, appetite change, fatigue and unexpected weight change.  Eyes: Negative for pain and visual disturbance.  Respiratory: Negative for cough and shortness of breath.   Cardiovascular: Negative for cp or palpitations    Gastrointestinal: Negative for nausea, diarrhea and constipation. neg for blood in stool , pos for lower abd pain intermittently Genitourinary: Negative for urgency and frequency.  Skin: Negative for pallor or rash   Neurological: Negative for weakness, light-headedness, numbness and headaches.  Hematological: Negative for adenopathy. Does not bruise/bleed easily.  Psychiatric/Behavioral: Negative for dysphoric mood. The patient is not nervous/anxious.      Objective:   Physical Exam  Constitutional: He appears well-developed and well-nourished. No distress.    HENT:  Head: Normocephalic and atraumatic.  Mouth/Throat: Oropharynx is clear and moist.  Eyes: Conjunctivae and EOM are normal. Pupils are equal, round, and reactive to light. No scleral icterus.  Neck: Normal range of motion. Neck supple.  Cardiovascular: Normal rate and regular rhythm.   Pulmonary/Chest: Effort normal and breath sounds normal. No respiratory distress. He has no wheezes. He has no rales.  Abdominal: Soft. Bowel sounds are normal. He exhibits no distension and no mass. There is no hepatosplenomegaly. There is tenderness in the left upper quadrant and left lower quadrant. There is no rigidity, no rebound, no guarding, no CVA tenderness, no tenderness at McBurney's point and negative Murphy's sign.  Very slight tenderness in bilat LQ worse in the Left side   Genitourinary:  No inguinal hernias noted   Musculoskeletal: He exhibits no edema.  Lymphadenopathy:    He has no cervical adenopathy.       Right: No inguinal adenopathy present.       Left: No inguinal adenopathy present.  Neurological: He is alert.  Skin: Skin is warm and dry. No rash noted. No erythema. No pallor.  Psychiatric: He has a normal mood and affect.          Assessment & Plan:   Problem List Items Addressed This Visit     Other   Abdominal pain, LLQ     No signs of hernia  No urinary symptoms  Does corresp with bm / and caliber had changed (Nl colonosc with 2 polyp in Feb)  ? Poss constipation or diverticulosis abd xray Heme cards times 3  Start fiber daily/citrucel  Update if not starting to improve in a week or if worsening      Relevant Orders      DG Abd 2 Views (Completed)      POC Hemoccult Bld/Stl (3-Cd Home Screen)    Other Visit Diagnoses   Lower abdominal pain    -  Primary    Relevant Orders       POCT urinalysis dipstick (Completed)       POC Hemoccult Bld/Stl (3-Cd Home Screen)      enc to drink more fluids

## 2014-06-05 NOTE — Patient Instructions (Signed)
Abdominal xray today  Please do stool card and return it to [place] - this checks for blood in stool Buy some citurcel fiber supplement - take daily mixed with water as directed  Also make sure to drink lots of water -to keep bowels moving  We will update you with results  If symptoms worsen or change let me know -especially if you develop fever or other new symptoms

## 2014-06-05 NOTE — Progress Notes (Signed)
Pre visit review using our clinic review tool, if applicable. No additional management support is needed unless otherwise documented below in the visit note. 

## 2014-06-12 ENCOUNTER — Other Ambulatory Visit: Payer: No Typology Code available for payment source

## 2014-06-12 DIAGNOSIS — R1030 Lower abdominal pain, unspecified: Secondary | ICD-10-CM

## 2014-06-12 DIAGNOSIS — R1032 Left lower quadrant pain: Secondary | ICD-10-CM

## 2014-06-12 NOTE — Addendum Note (Signed)
Addended by: Ellamae Sia on: 06/12/2014 04:28 PM   Modules accepted: Orders

## 2014-06-13 LAB — HEMOCCULT SLIDES (X 3 CARDS)
FECAL OCCULT BLD: NEGATIVE
OCCULT 1: NEGATIVE
OCCULT 2: NEGATIVE
OCCULT 3: NEGATIVE
OCCULT 4: NEGATIVE
OCCULT 5: NEGATIVE

## 2014-08-05 ENCOUNTER — Other Ambulatory Visit: Payer: Self-pay | Admitting: Family Medicine

## 2014-10-02 IMAGING — CR DG ABDOMEN 2V
2 series · 2 of 2 positions shown · non-contrast
Comparison: None.

CLINICAL DATA: Lower abdominal pain

EXAM:
ABDOMEN - 2 VIEW

[view not recorded (1 of 2)]
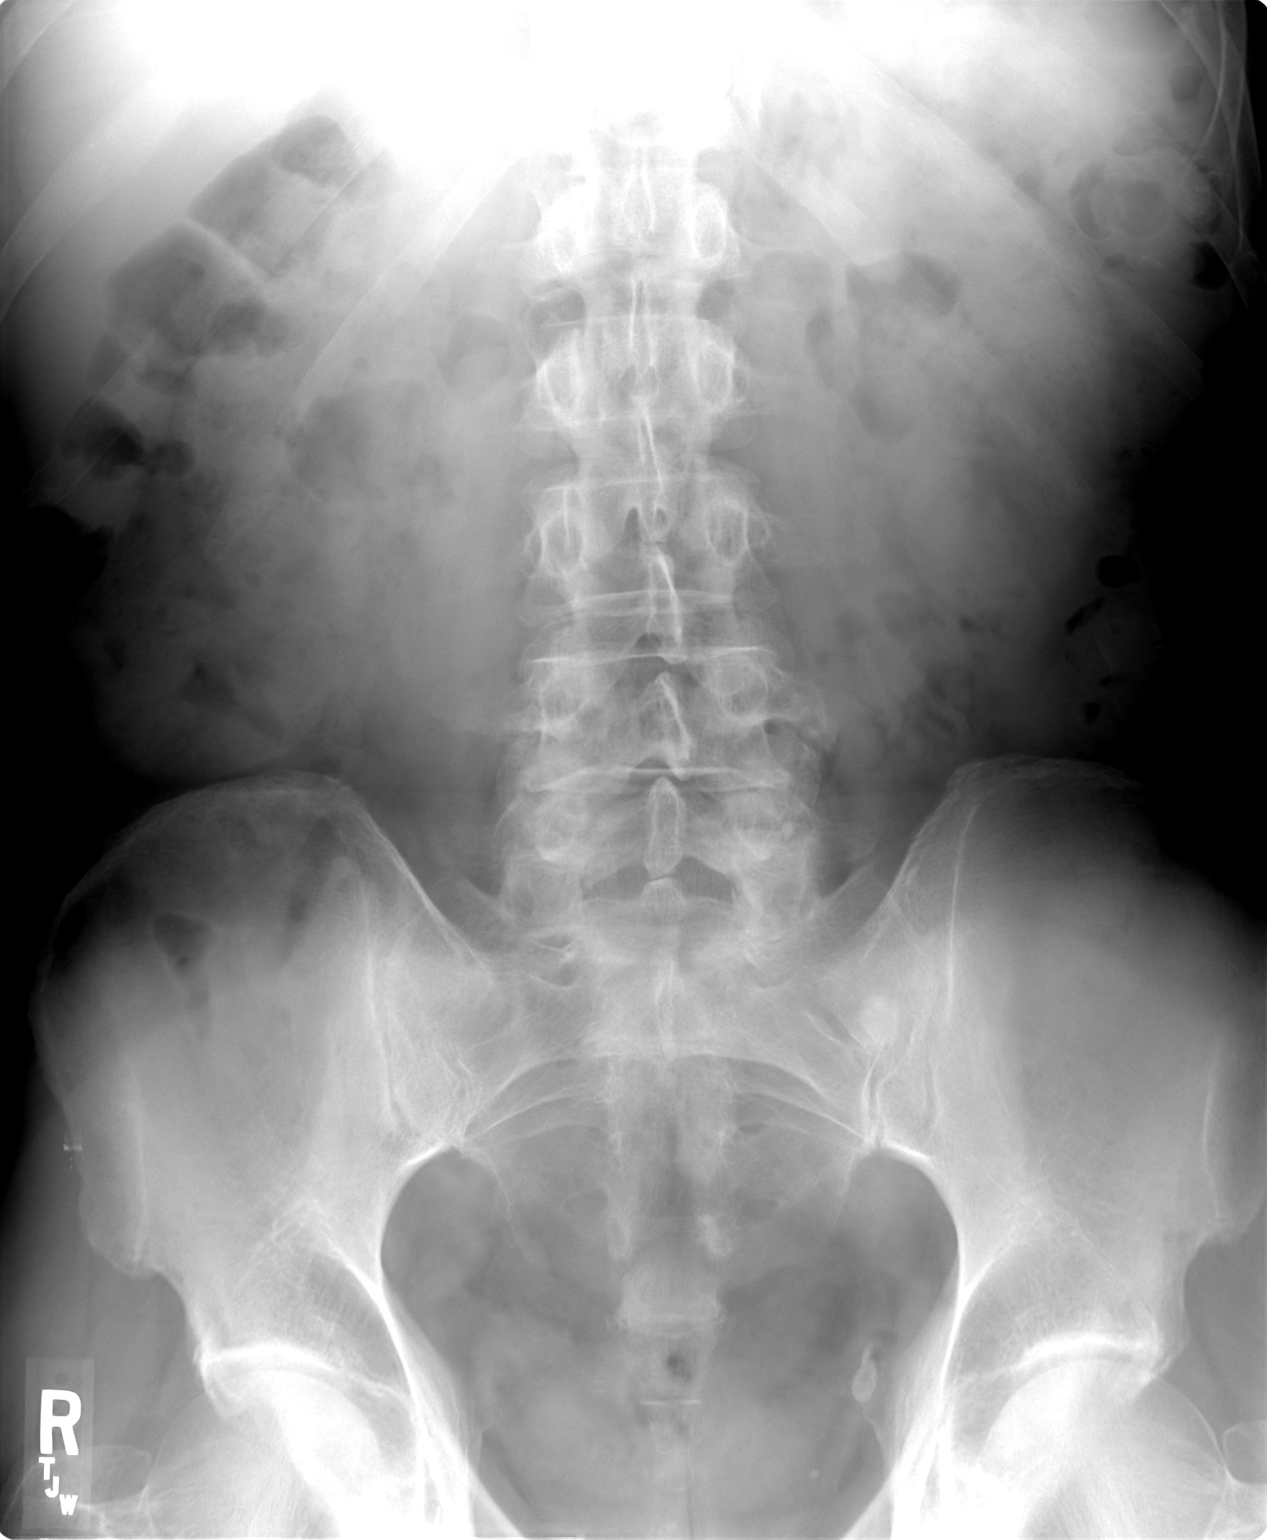

[view not recorded (2 of 2)]
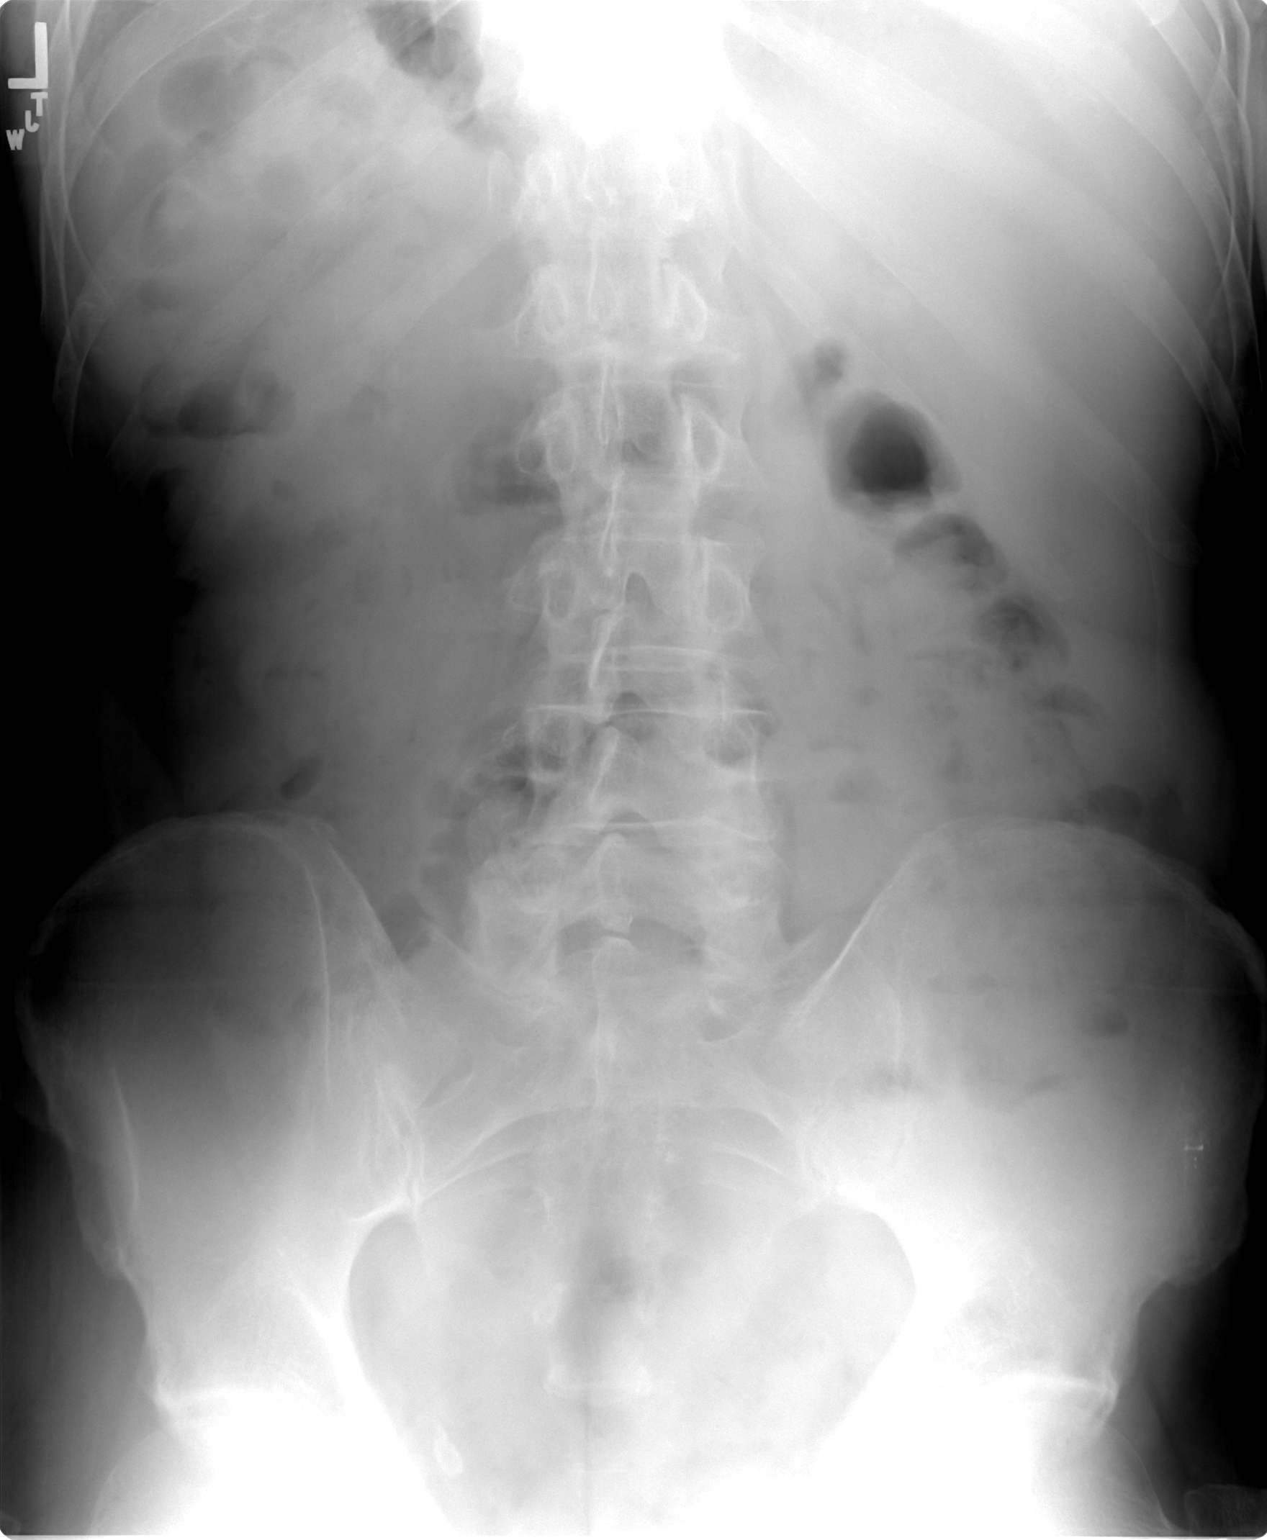

[2 of 2 positions shown; findings below may reference images not displayed]

FINDINGS: Supine and upright images were obtained. There is moderate stool
throughout the colon. The bowel gas pattern is unremarkable. No
obstruction or free air is appreciable. There are apparent
phleboliths in the pelvis.
IMPRESSION: Overall bowel gas pattern unremarkable.  Moderate stool in colon.

## 2014-10-29 ENCOUNTER — Telehealth: Payer: Self-pay | Admitting: Family Medicine

## 2014-10-29 DIAGNOSIS — R740 Nonspecific elevation of levels of transaminase and lactic acid dehydrogenase [LDH]: Secondary | ICD-10-CM

## 2014-10-29 DIAGNOSIS — R972 Elevated prostate specific antigen [PSA]: Secondary | ICD-10-CM

## 2014-10-29 DIAGNOSIS — R739 Hyperglycemia, unspecified: Secondary | ICD-10-CM

## 2014-10-29 DIAGNOSIS — E785 Hyperlipidemia, unspecified: Secondary | ICD-10-CM

## 2014-10-29 DIAGNOSIS — R7402 Elevation of levels of lactic acid dehydrogenase (LDH): Secondary | ICD-10-CM

## 2014-10-29 DIAGNOSIS — I10 Essential (primary) hypertension: Secondary | ICD-10-CM

## 2014-10-29 DIAGNOSIS — D696 Thrombocytopenia, unspecified: Secondary | ICD-10-CM

## 2014-10-29 NOTE — Telephone Encounter (Signed)
-----   Message from Ellamae Sia sent at 10/24/2014  2:39 PM EST ----- Regarding: Lab orders for Wednesday, 1.20.16 Patient is scheduled for CPX labs, please order future labs, Thanks , Karna Christmas

## 2014-10-30 ENCOUNTER — Other Ambulatory Visit (INDEPENDENT_AMBULATORY_CARE_PROVIDER_SITE_OTHER): Payer: No Typology Code available for payment source

## 2014-10-30 DIAGNOSIS — R972 Elevated prostate specific antigen [PSA]: Secondary | ICD-10-CM

## 2014-10-30 DIAGNOSIS — R740 Nonspecific elevation of levels of transaminase and lactic acid dehydrogenase [LDH]: Secondary | ICD-10-CM

## 2014-10-30 DIAGNOSIS — D696 Thrombocytopenia, unspecified: Secondary | ICD-10-CM

## 2014-10-30 DIAGNOSIS — R739 Hyperglycemia, unspecified: Secondary | ICD-10-CM

## 2014-10-30 DIAGNOSIS — I10 Essential (primary) hypertension: Secondary | ICD-10-CM

## 2014-10-30 DIAGNOSIS — R7402 Elevation of levels of lactic acid dehydrogenase (LDH): Secondary | ICD-10-CM

## 2014-10-30 DIAGNOSIS — E785 Hyperlipidemia, unspecified: Secondary | ICD-10-CM

## 2014-10-30 LAB — CBC WITH DIFFERENTIAL/PLATELET
Basophils Absolute: 0 10*3/uL (ref 0.0–0.1)
Basophils Relative: 0.8 % (ref 0.0–3.0)
EOS PCT: 8.5 % — AB (ref 0.0–5.0)
Eosinophils Absolute: 0.5 10*3/uL (ref 0.0–0.7)
HEMATOCRIT: 42.8 % (ref 39.0–52.0)
Hemoglobin: 14.9 g/dL (ref 13.0–17.0)
Lymphocytes Relative: 22.8 % (ref 12.0–46.0)
Lymphs Abs: 1.2 10*3/uL (ref 0.7–4.0)
MCHC: 34.9 g/dL (ref 30.0–36.0)
MCV: 93.8 fl (ref 78.0–100.0)
MONOS PCT: 8.3 % (ref 3.0–12.0)
Monocytes Absolute: 0.5 10*3/uL (ref 0.1–1.0)
NEUTROS ABS: 3.3 10*3/uL (ref 1.4–7.7)
Neutrophils Relative %: 59.6 % (ref 43.0–77.0)
Platelets: 108 10*3/uL — ABNORMAL LOW (ref 150.0–400.0)
RBC: 4.57 Mil/uL (ref 4.22–5.81)
RDW: 13.9 % (ref 11.5–15.5)
WBC: 5.5 10*3/uL (ref 4.0–10.5)

## 2014-10-30 LAB — COMPREHENSIVE METABOLIC PANEL
ALBUMIN: 4 g/dL (ref 3.5–5.2)
ALT: 29 U/L (ref 0–53)
AST: 26 U/L (ref 0–37)
Alkaline Phosphatase: 47 U/L (ref 39–117)
BUN: 12 mg/dL (ref 6–23)
CHLORIDE: 103 meq/L (ref 96–112)
CO2: 32 meq/L (ref 19–32)
Calcium: 9.1 mg/dL (ref 8.4–10.5)
Creatinine, Ser: 0.8 mg/dL (ref 0.40–1.50)
GFR: 101.14 mL/min (ref >60.00)
GLUCOSE: 124 mg/dL — AB (ref 70–99)
Potassium: 3.9 mEq/L (ref 3.5–5.1)
SODIUM: 139 meq/L (ref 135–145)
Total Bilirubin: 0.8 mg/dL (ref 0.2–1.2)
Total Protein: 6 g/dL (ref 6.0–8.3)

## 2014-10-30 LAB — LIPID PANEL
CHOL/HDL RATIO: 3
Cholesterol: 149 mg/dL (ref 0–200)
HDL: 50 mg/dL (ref >39.00)
NonHDL: 99
TRIGLYCERIDES: 294 mg/dL — AB (ref 0.0–149.0)
VLDL: 58.8 mg/dL — ABNORMAL HIGH (ref 0.0–40.0)

## 2014-10-30 LAB — PSA: PSA: 1.05 ng/mL (ref 0.10–4.00)

## 2014-10-30 LAB — LDL CHOLESTEROL, DIRECT: LDL DIRECT: 64 mg/dL

## 2014-10-30 LAB — HEMOGLOBIN A1C: Hgb A1c MFr Bld: 5.9 % (ref 4.6–6.5)

## 2014-10-30 LAB — TSH: TSH: 2.7 u[IU]/mL (ref 0.35–4.50)

## 2014-11-05 ENCOUNTER — Encounter: Payer: Self-pay | Admitting: Family Medicine

## 2014-11-05 ENCOUNTER — Ambulatory Visit (INDEPENDENT_AMBULATORY_CARE_PROVIDER_SITE_OTHER): Payer: No Typology Code available for payment source | Admitting: Family Medicine

## 2014-11-05 VITALS — BP 124/88 | HR 65 | Temp 98.1°F | Ht 72.0 in | Wt 186.8 lb

## 2014-11-05 DIAGNOSIS — D696 Thrombocytopenia, unspecified: Secondary | ICD-10-CM

## 2014-11-05 DIAGNOSIS — H538 Other visual disturbances: Secondary | ICD-10-CM | POA: Insufficient documentation

## 2014-11-05 DIAGNOSIS — Z23 Encounter for immunization: Secondary | ICD-10-CM

## 2014-11-05 DIAGNOSIS — I10 Essential (primary) hypertension: Secondary | ICD-10-CM

## 2014-11-05 DIAGNOSIS — Z125 Encounter for screening for malignant neoplasm of prostate: Secondary | ICD-10-CM | POA: Insufficient documentation

## 2014-11-05 DIAGNOSIS — Z0000 Encounter for general adult medical examination without abnormal findings: Secondary | ICD-10-CM

## 2014-11-05 DIAGNOSIS — E781 Pure hyperglyceridemia: Secondary | ICD-10-CM

## 2014-11-05 MED ORDER — ALLOPURINOL 300 MG PO TABS: 300.0000 mg | ORAL_TABLET | Freq: Every day | ORAL | Status: DC

## 2014-11-05 MED ORDER — LISINOPRIL 20 MG PO TABS: 20.0000 mg | ORAL_TABLET | Freq: Every day | ORAL | Status: DC

## 2014-11-05 MED ORDER — BISOPROLOL FUMARATE 5 MG PO TABS: ORAL_TABLET | ORAL | Status: DC

## 2014-11-05 NOTE — Progress Notes (Signed)
Subjective:    Patient ID: Jeffrey [name], male    DOB: 1941/09/02, 74 y.o.   MRN: [id number]  HPI Here for health maintenance exam and to review chronic medical problems    Has been ok overall   Has a few issues  3-4 times in the past year- when he stands up/ or when working on computer he will get peripheral blurry vision- a few minutes and then normal again  No headache or other symptoms  One episode of vertigo several weeks ago - when he woke up (the room was spinning)   Last visit to the eye doctor was a few years ago   Wt is up 8 lb with bmi of 25 Working on loosing - he likes to be in the [place] with holiday/traveling- was not exercising for a while  Now - back to bike and treadmill    Zoster vaccine - sent it to the pharmacy in 2014 -never got it however  Pneumonia vaccine 11/12- will get prevnar here today F;u vaccine 10/14- wants one today   Td 10/14  colonosc 2/15- had small polyp , and family hx of colon cancer in sister- 34 year recall   Hyperglycemia Lab Results  Component Value Date   HGBA1C 5.9 10/30/2014   This is down from 6  Hyperlipidemia Lab Results  Component Value Date   CHOL 149 10/30/2014   CHOL 176 07/11/2013   CHOL 171 12/27/2011   Lab Results  Component Value Date   HDL 50.00 10/30/2014   HDL 49.20 07/11/2013   HDL 45.70 12/27/2011   Lab Results  Component Value Date   LDLCALC 83 12/16/2010   LDLCALC See Comment mg/dL 09/02/2010   LDLCALC 58 01/19/2010   Lab Results  Component Value Date   TRIG 294.0* 10/30/2014   TRIG 261.0* 07/11/2013   TRIG 272.0* 12/27/2011   Lab Results  Component Value Date   CHOLHDL 3 10/30/2014   CHOLHDL 4 07/11/2013   CHOLHDL 4 12/27/2011   Lab Results  Component Value Date   LDLDIRECT 64.0 10/30/2014   LDLDIRECT 86.0 07/11/2013   LDLDIRECT 84.9 12/27/2011    Trig tend to be high - he has had high triglycerides for his whole life - in the distant past was as high as 800 per pt  He  controls it with diet  Stays away from red meat and fried foods  Also watches sugar in diet -this is stable   Prostate cancer screening  Lab Results  Component Value Date   PSA 1.05 10/30/2014   PSA 1.00 07/11/2013   PSA 0.74 08/20/2011   overall stable  No prostate symptoms  Nocturia once - occ (has always)  No change in stream , or frequency Thinks he empties his bladder well  No prostate cancer in the family    Thrombocytopenia Lab Results  Component Value Date   WBC 5.5 10/30/2014   HGB 14.9 10/30/2014   HCT 42.8 10/30/2014   MCV 93.8 10/30/2014   PLT 108.0* 10/30/2014   this is down from last time  No bleeding or bruising -at all  He drinks 2 glasses of wine with dinner    bp is stable today  No cp or palpitations or headaches or edema  No side effects to medicines  BP Readings from Last 3 Encounters:  11/05/14 124/88  06/05/14 122/82  11/21/13 126/76       Review of Systems Review of Systems  Constitutional: Negative for fever,  appetite change, fatigue and unexpected weight change.  Eyes: Negative for pain and pos for several episodes of blurred periph vision  Respiratory: Negative for cough and shortness of breath.   Cardiovascular: Negative for cp or palpitations    Gastrointestinal: Negative for nausea, diarrhea and constipation.  Genitourinary: Negative for urgency and frequency.  Skin: Negative for pallor or rash   Neurological: Negative for weakness, light-headedness, numbness and headaches.  Hematological: Negative for adenopathy. Does not bruise/bleed easily.  Psychiatric/Behavioral: Negative for dysphoric mood. The patient is not nervous/anxious.         Objective:   Physical Exam  Constitutional: He appears well-developed and well-nourished. No distress.  HENT:  Head: Normocephalic and atraumatic.  Right Ear: External ear normal.  Left Ear: External ear normal.  Nose: Nose normal.  Mouth/Throat: Oropharynx is clear and moist.  Eyes:  Conjunctivae and EOM are normal. Pupils are equal, round, and reactive to light. Right eye exhibits no discharge. Left eye exhibits no discharge. No scleral icterus.  Neck: Normal range of motion. Neck supple. No JVD present. Carotid bruit is not present. No thyromegaly present.  Cardiovascular: Normal rate, regular rhythm, normal heart sounds and intact distal pulses.  Exam reveals no gallop.   Pulmonary/Chest: Effort normal and breath sounds normal. No respiratory distress. He has no wheezes. He exhibits no tenderness.  Abdominal: Soft. Bowel sounds are normal. He exhibits no distension, no abdominal bruit and no mass. There is no tenderness.  Musculoskeletal: He exhibits no edema or tenderness.  Lymphadenopathy:    He has no cervical adenopathy.  Neurological: He is alert. He has normal reflexes. No cranial nerve deficit. He exhibits normal muscle tone. Coordination normal.  Skin: Skin is warm and dry. No rash noted. No erythema. No pallor.  Psychiatric: He has a normal mood and affect.          Assessment & Plan:   Problem List Items Addressed This Visit      Cardiovascular and Mediastinum   Essential hypertension    bp in fair control at this time  BP Readings from Last 1 Encounters:  11/05/14 124/88   No changes needed Disc lifstyle change with low sodium diet and exercise   Labs reviewed       Relevant Medications   bisoprolol (ZEBETA) tablet   lisinopril (PRINIVIL,ZESTRIL) tablet     Other   Blurred vision - Primary    Several episodes of blurred periph vision w/o other symptoms  Nl exam today  Pt will f/u with opthy for eval  If he develops other symptoms -inc ha or speech change -will seek care asap      Hypertriglyceridemia    Ongoing -improved with better diet Disc goals for lipids and reasons to control them Rev labs with pt Rev low sat fat diet in detail Given handout on low cholesterol/ trig diet  Will continue to follow      Relevant Medications     bisoprolol (ZEBETA) tablet   lisinopril (PRINIVIL,ZESTRIL) tablet   Prostate cancer screening    Lab Results  Component Value Date   PSA 1.05 10/30/2014   PSA 1.00 07/11/2013   PSA 0.74 08/20/2011   Stable No symptoms  No family hx  Will continue to follow      Routine general medical examination at a health care facility    Reviewed health habits including diet and exercise and skin cancer prevention Reviewed appropriate screening tests for age  Also reviewed health mt list, fam hx  and immunization status , as well as social and family history    Labs reviewed Flu shot and prevnar today  May consider zoster vaccine if it is covered   Enc healthy habits       Thrombocytopenia    Ongoing and mild w/o symptoms Lab Results  Component Value Date   WBC 5.5 10/30/2014   HGB 14.9 10/30/2014   HCT 42.8 10/30/2014   MCV 93.8 10/30/2014   PLT 108.0* 10/30/2014  Pl count has never gone below 100- that is threshold for ref  Other cell lines nl  Re check 3 mo  Will update if bruising or bleeding         Other Visit Diagnoses    Need for prophylactic vaccination against Streptococcus pneumoniae (pneumococcus)        Relevant Orders    Pneumococcal conjugate vaccine 13-valent IM (Completed)    Flu vaccine need        Relevant Orders    Flu Vaccine QUAD 36+ mos IM (Completed)

## 2014-11-05 NOTE — Patient Instructions (Addendum)
Flu vaccine today prevnar vaccine today If you are interested in a shingles/zoster vaccine - call your insurance to check on coverage,( you should not get it within 1 month of other vaccines) , then call us for a prescription  for it to take to a pharmacy that gives the shot , or make a nurse visit to get it here depending on your coverage   Make a lab appointment to re check platelet count in 3 months - let me know if you develop any bruising or bleeding  See an eye doctor for a comprehensive eye exam and discuss the symptoms that you have   For high triglycerides-try to eat a low fat diet and keep exercising  Food Choices to Lower Your Triglycerides  Triglycerides are a type of fat in your blood. High levels of triglycerides can increase the risk of heart disease and stroke. If your triglyceride levels are high, the foods you eat and your eating habits are very important. Choosing the right foods can help lower your triglycerides.  WHAT GENERAL GUIDELINES DO I NEED TO FOLLOW?  Lose weight if you are overweight.   Limit or avoid alcohol.   Fill one half of your plate with vegetables and green salads.   Limit fruit to two servings a day. Choose fruit instead of juice.   Make one fourth of your plate whole grains. Look for the word "whole" as the first word in the ingredient list.  Fill one fourth of your plate with lean protein foods.  Enjoy fatty fish (such as salmon, mackerel, sardines, and tuna) three times a week.   Choose healthy fats.   Limit foods high in starch and sugar.  Eat more home-cooked food and less restaurant, buffet, and fast food.  Limit fried foods.  Cook foods using methods other than frying.  Limit saturated fats.  Check ingredient lists to avoid foods with partially hydrogenated oils (trans fats) in them. WHAT FOODS CAN I EAT?  Grains Whole grains, such as whole wheat or whole grain breads, crackers, cereals, and pasta. Unsweetened oatmeal,  bulgur, barley, quinoa, or brown rice. Corn or whole wheat flour tortillas.  Vegetables Fresh or frozen vegetables (raw, steamed, roasted, or grilled). Green salads. Fruits All fresh, canned (in natural juice), or frozen fruits. Meat and Other Protein Products Ground beef (85% or leaner), grass-fed beef, or beef trimmed of fat. Skinless chicken or [place]. Ground chicken or [place]. Pork trimmed of fat. All fish and seafood. Eggs. Dried beans, peas, or lentils. Unsalted nuts or seeds. Unsalted canned or dry beans. Dairy Low-fat dairy products, such as skim or 1% milk, 2% or reduced-fat cheeses, low-fat ricotta or cottage cheese, or plain low-fat yogurt. Fats and Oils Tub margarines without trans fats. Light or reduced-fat mayonnaise and salad dressings. Avocado. Safflower, olive, or canola oils. Natural peanut or almond butter. The items listed above may not be a complete list of recommended foods or beverages. Contact your dietitian for more options. WHAT FOODS ARE NOT RECOMMENDED?  Grains White bread. White pasta. White rice. Cornbread. Bagels, pastries, and croissants. Crackers that contain trans fat. Vegetables White potatoes. Corn. Creamed or fried vegetables. Vegetables in a cheese sauce. Fruits Dried fruits. Canned fruit in light or heavy syrup. Fruit juice. Meat and Other Protein Products Fatty cuts of meat. Ribs, chicken wings, bacon, sausage, bologna, salami, chitterlings, fatback, hot dogs, bratwurst, and packaged luncheon meats. Dairy Whole or 2% milk, cream, half-and-half, and cream cheese. Whole-fat or sweetened yogurt. Full-fat cheeses. Nondairy  creamers and whipped toppings. Processed cheese, cheese spreads, or cheese curds. Sweets and Desserts Corn syrup, sugars, honey, and molasses. Candy. Jam and jelly. Syrup. Sweetened cereals. Cookies, pies, cakes, donuts, muffins, and ice cream. Fats and Oils Butter, stick margarine, lard, shortening, ghee, or bacon fat. Coconut, palm  kernel, or palm oils. Beverages Alcohol. Sweetened drinks (such as sodas, lemonade, and fruit drinks or punches). The items listed above may not be a complete list of foods and beverages to avoid. Contact your dietitian for more information. Document Released: 07/15/2004 Document Revised: 10/02/2013 Document Reviewed: 08/01/2013 [facility] Patient Information 2015 [place], [place]. This information is not intended to replace advice given to you by your health care provider. Make sure you discuss any questions you have with your health care provider.

## 2014-11-05 NOTE — Progress Notes (Signed)
Pre visit review using our clinic review tool, if applicable. No additional management support is needed unless otherwise documented below in the visit note. 

## 2014-11-07 NOTE — Assessment & Plan Note (Signed)
Several episodes of blurred periph vision w/o other symptoms  Nl exam today  Pt will f/u with opthy for eval  If he develops other symptoms -inc ha or speech change -will seek care asap

## 2014-11-07 NOTE — Assessment & Plan Note (Signed)
Ongoing -improved with better diet Disc goals for lipids and reasons to control them Rev labs with pt Rev low sat fat diet in detail Given handout on low cholesterol/ trig diet  Will continue to follow

## 2014-11-07 NOTE — Assessment & Plan Note (Signed)
Lab Results  Component Value Date   PSA 1.05 10/30/2014   PSA 1.00 07/11/2013   PSA 0.74 08/20/2011   Stable No symptoms  No family hx  Will continue to follow

## 2014-11-07 NOTE — Assessment & Plan Note (Signed)
bp in fair control at this time  BP Readings from Last 1 Encounters:  11/05/14 124/88   No changes needed Disc lifstyle change with low sodium diet and exercise   Labs reviewed

## 2014-11-07 NOTE — Assessment & Plan Note (Signed)
Ongoing and mild w/o symptoms Lab Results  Component Value Date   WBC 5.5 10/30/2014   HGB 14.9 10/30/2014   HCT 42.8 10/30/2014   MCV 93.8 10/30/2014   PLT 108.0* 10/30/2014  Pl count has never gone below 100- that is threshold for ref  Other cell lines nl  Re check 3 mo  Will update if bruising or bleeding

## 2014-11-07 NOTE — Assessment & Plan Note (Signed)
Reviewed health habits including diet and exercise and skin cancer prevention Reviewed appropriate screening tests for age  Also reviewed health mt list, fam hx and immunization status , as well as social and family history    Labs reviewed Flu shot and prevnar today  May consider zoster vaccine if it is covered   Enc healthy habits

## 2015-02-04 ENCOUNTER — Other Ambulatory Visit: Payer: No Typology Code available for payment source

## 2015-02-09 ENCOUNTER — Telehealth: Payer: Self-pay | Admitting: Family Medicine

## 2015-02-09 DIAGNOSIS — R739 Hyperglycemia, unspecified: Secondary | ICD-10-CM

## 2015-02-09 DIAGNOSIS — E781 Pure hyperglyceridemia: Secondary | ICD-10-CM

## 2015-02-09 NOTE — Telephone Encounter (Signed)
-----   Message from Ellamae Sia sent at 02/07/2015  9:17 AM EDT ----- Regarding: Lab orders for Monday, 5.2.16 Lab orders for 3 month f/u

## 2015-02-10 ENCOUNTER — Other Ambulatory Visit (INDEPENDENT_AMBULATORY_CARE_PROVIDER_SITE_OTHER): Payer: PPO

## 2015-02-10 DIAGNOSIS — R739 Hyperglycemia, unspecified: Secondary | ICD-10-CM

## 2015-02-10 DIAGNOSIS — E781 Pure hyperglyceridemia: Secondary | ICD-10-CM | POA: Diagnosis not present

## 2015-02-10 LAB — BASIC METABOLIC PANEL
BUN: 17 mg/dL (ref 6–23)
CHLORIDE: 105 meq/L (ref 96–112)
CO2: 25 meq/L (ref 19–32)
Calcium: 9 mg/dL (ref 8.4–10.5)
Creatinine, Ser: 0.77 mg/dL (ref 0.40–1.50)
GFR: 105.61 mL/min (ref >60.00)
Glucose, Bld: 114 mg/dL — ABNORMAL HIGH (ref 70–99)
POTASSIUM: 4.2 meq/L (ref 3.5–5.1)
Sodium: 139 mEq/L (ref 135–145)

## 2015-02-10 LAB — LIPID PANEL
CHOL/HDL RATIO: 3
Cholesterol: 163 mg/dL (ref 0–200)
HDL: 47.8 mg/dL (ref >39.00)
NonHDL: 115.2
Triglycerides: 354 mg/dL — ABNORMAL HIGH (ref 0.0–149.0)
VLDL: 70.8 mg/dL — AB (ref 0.0–40.0)

## 2015-02-10 LAB — LDL CHOLESTEROL, DIRECT: LDL DIRECT: 55 mg/dL

## 2015-02-10 LAB — HEMOGLOBIN A1C: Hgb A1c MFr Bld: 5.7 % (ref 4.6–6.5)

## 2015-02-25 ENCOUNTER — Other Ambulatory Visit: Payer: Self-pay | Admitting: Family Medicine

## 2015-12-01 ENCOUNTER — Other Ambulatory Visit: Payer: Self-pay | Admitting: Family Medicine

## 2016-01-04 ENCOUNTER — Telehealth: Payer: Self-pay | Admitting: Family Medicine

## 2016-01-04 DIAGNOSIS — D696 Thrombocytopenia, unspecified: Secondary | ICD-10-CM

## 2016-01-04 DIAGNOSIS — M109 Gout, unspecified: Secondary | ICD-10-CM

## 2016-01-04 DIAGNOSIS — R972 Elevated prostate specific antigen [PSA]: Secondary | ICD-10-CM

## 2016-01-04 DIAGNOSIS — R739 Hyperglycemia, unspecified: Secondary | ICD-10-CM

## 2016-01-04 DIAGNOSIS — I10 Essential (primary) hypertension: Secondary | ICD-10-CM

## 2016-01-04 DIAGNOSIS — E781 Pure hyperglyceridemia: Secondary | ICD-10-CM

## 2016-01-04 NOTE — Telephone Encounter (Signed)
-----   Message from Marchia Bond sent at 12/30/2015  1:26 PM EDT ----- Regarding: Cpx labs Thurs 3/30, need orders. Thanks! :-) Please order  future cpx labs for pt's upcoming lab appt. Thanks Aniceto Boss

## 2016-01-08 ENCOUNTER — Other Ambulatory Visit: Payer: PPO

## 2016-01-12 ENCOUNTER — Other Ambulatory Visit (INDEPENDENT_AMBULATORY_CARE_PROVIDER_SITE_OTHER): Payer: PPO

## 2016-01-12 DIAGNOSIS — R739 Hyperglycemia, unspecified: Secondary | ICD-10-CM

## 2016-01-12 DIAGNOSIS — E781 Pure hyperglyceridemia: Secondary | ICD-10-CM

## 2016-01-12 DIAGNOSIS — I10 Essential (primary) hypertension: Secondary | ICD-10-CM | POA: Diagnosis not present

## 2016-01-12 DIAGNOSIS — D696 Thrombocytopenia, unspecified: Secondary | ICD-10-CM | POA: Diagnosis not present

## 2016-01-12 DIAGNOSIS — M109 Gout, unspecified: Secondary | ICD-10-CM

## 2016-01-12 DIAGNOSIS — R972 Elevated prostate specific antigen [PSA]: Secondary | ICD-10-CM | POA: Diagnosis not present

## 2016-01-12 LAB — COMPREHENSIVE METABOLIC PANEL
ALBUMIN: 4.2 g/dL (ref 3.5–5.2)
ALK PHOS: 48 U/L (ref 39–117)
ALT: 25 U/L (ref 0–53)
AST: 26 U/L (ref 0–37)
BUN: 16 mg/dL (ref 6–23)
CO2: 29 mEq/L (ref 19–32)
CREATININE: 0.72 mg/dL (ref 0.40–1.50)
Calcium: 9.2 mg/dL (ref 8.4–10.5)
Chloride: 105 mEq/L (ref 96–112)
GFR: 113.83 mL/min (ref >60.00)
Glucose, Bld: 123 mg/dL — ABNORMAL HIGH (ref 70–99)
Potassium: 4.2 mEq/L (ref 3.5–5.1)
SODIUM: 142 meq/L (ref 135–145)
TOTAL PROTEIN: 6.4 g/dL (ref 6.0–8.3)
Total Bilirubin: 0.5 mg/dL (ref 0.2–1.2)

## 2016-01-12 LAB — CBC WITH DIFFERENTIAL/PLATELET
BASOS ABS: 0 10*3/uL (ref 0.0–0.1)
Basophils Relative: 0.5 % (ref 0.0–3.0)
EOS ABS: 0.4 10*3/uL (ref 0.0–0.7)
Eosinophils Relative: 8.6 % — ABNORMAL HIGH (ref 0.0–5.0)
HCT: 43.1 % (ref 39.0–52.0)
HEMOGLOBIN: 14.7 g/dL (ref 13.0–17.0)
Lymphocytes Relative: 28.7 % (ref 12.0–46.0)
Lymphs Abs: 1.5 10*3/uL (ref 0.7–4.0)
MCHC: 34.1 g/dL (ref 30.0–36.0)
MCV: 95.2 fl (ref 78.0–100.0)
MONO ABS: 0.5 10*3/uL (ref 0.1–1.0)
Monocytes Relative: 10.5 % (ref 3.0–12.0)
Neutro Abs: 2.6 10*3/uL (ref 1.4–7.7)
Neutrophils Relative %: 51.7 % (ref 43.0–77.0)
Platelets: 108 10*3/uL — ABNORMAL LOW (ref 150.0–400.0)
RBC: 4.52 Mil/uL (ref 4.22–5.81)
RDW: 13.3 % (ref 11.5–15.5)
WBC: 5.1 10*3/uL (ref 4.0–10.5)

## 2016-01-12 LAB — LIPID PANEL
CHOL/HDL RATIO: 3
Cholesterol: 148 mg/dL (ref 0–200)
HDL: 51.5 mg/dL (ref >39.00)
LDL CALC: 65 mg/dL (ref 0–99)
NONHDL: 96.74
Triglycerides: 157 mg/dL — ABNORMAL HIGH (ref 0.0–149.0)
VLDL: 31.4 mg/dL (ref 0.0–40.0)

## 2016-01-12 LAB — PSA: PSA: 1.08 ng/mL (ref 0.10–4.00)

## 2016-01-12 LAB — TSH: TSH: 3.19 u[IU]/mL (ref 0.35–4.50)

## 2016-01-12 LAB — URIC ACID: Uric Acid, Serum: 4.8 mg/dL (ref 4.0–7.8)

## 2016-01-12 LAB — HEMOGLOBIN A1C: Hgb A1c MFr Bld: 5.7 % (ref 4.6–6.5)

## 2016-01-14 ENCOUNTER — Encounter: Payer: Self-pay | Admitting: Family Medicine

## 2016-01-14 ENCOUNTER — Ambulatory Visit (INDEPENDENT_AMBULATORY_CARE_PROVIDER_SITE_OTHER): Payer: PPO | Admitting: Family Medicine

## 2016-01-14 VITALS — BP 130/80 | HR 49 | Temp 97.5°F | Ht 71.5 in | Wt 176.5 lb

## 2016-01-14 DIAGNOSIS — E781 Pure hyperglyceridemia: Secondary | ICD-10-CM

## 2016-01-14 DIAGNOSIS — Z1211 Encounter for screening for malignant neoplasm of colon: Secondary | ICD-10-CM

## 2016-01-14 DIAGNOSIS — R972 Elevated prostate specific antigen [PSA]: Secondary | ICD-10-CM

## 2016-01-14 DIAGNOSIS — H6120 Impacted cerumen, unspecified ear: Secondary | ICD-10-CM | POA: Insufficient documentation

## 2016-01-14 DIAGNOSIS — I10 Essential (primary) hypertension: Secondary | ICD-10-CM

## 2016-01-14 DIAGNOSIS — D696 Thrombocytopenia, unspecified: Secondary | ICD-10-CM

## 2016-01-14 DIAGNOSIS — H6123 Impacted cerumen, bilateral: Secondary | ICD-10-CM

## 2016-01-14 DIAGNOSIS — R739 Hyperglycemia, unspecified: Secondary | ICD-10-CM

## 2016-01-14 DIAGNOSIS — M109 Gout, unspecified: Secondary | ICD-10-CM

## 2016-01-14 DIAGNOSIS — Z0000 Encounter for general adult medical examination without abnormal findings: Secondary | ICD-10-CM | POA: Diagnosis not present

## 2016-01-14 MED ORDER — LISINOPRIL 20 MG PO TABS: ORAL_TABLET | ORAL | Status: DC

## 2016-01-14 MED ORDER — BISOPROLOL FUMARATE 5 MG PO TABS: 7.5000 mg | ORAL_TABLET | Freq: Every day | ORAL | Status: DC

## 2016-01-14 MED ORDER — ALLOPURINOL 300 MG PO TABS: ORAL_TABLET | ORAL | Status: DC

## 2016-01-14 NOTE — Patient Instructions (Addendum)
Please stop at check out to talk to [facility] about your next appointment If you are interested in a shingles/zoster vaccine - call your insurance to check on coverage,( you should not get it within 1 month of other vaccines) , then call us for a prescription  for it to take to a pharmacy that gives the shot , or make a nurse visit to get it here depending on your coverage Use the ear wax softening drops regularly in the left ear - you can try flushing it your self - if no luck after 2 weeks- make an appointment here so we can try it again  R ear was cleared

## 2016-01-14 NOTE — Progress Notes (Signed)
Pre visit review using our clinic review tool, if applicable. No additional management support is needed unless otherwise documented below in the visit note. 

## 2016-01-14 NOTE — Progress Notes (Signed)
Subjective:    Patient ID: Jeffrey [name], male    DOB: 1941-01-20, 75 y.o.   MRN: [id number]  HPI  Here for health maintenance exam and to review chronic medical problems    Doing well overall -feeling ok overall   Will plan medicare wellness visit at a later date  Wt is down 10 lb with bmi of 24 Eating healthier and eating less  Also exercising - walking and cycling and kayaking   Zoster vaccine - interested if it is covered well enough   Flu vaccine -did not get it this season  We are out of shots   Td 10/14  Colonoscopy 2/15 fam hx of colon polyps Had a tubular adenoma  Recall is 2/18 (3 years)  PNA- up to date on both vaccines   Remote hx of elevated psa Lab Results  Component Value Date   PSA 1.08 01/12/2016   PSA 1.05 10/30/2014   PSA 1.00 07/11/2013   Improved and stable now  No complaints  No nocturia    Hyperglycemia is stable Lab Results  Component Value Date   HGBA1C 5.7 01/12/2016  lost weight and not eating sweets   Hx of high triglycerides Lab Results  Component Value Date   CHOL 148 01/12/2016   CHOL 163 02/10/2015   CHOL 149 10/30/2014   Lab Results  Component Value Date   HDL 51.50 01/12/2016   HDL 47.80 02/10/2015   HDL 50.00 10/30/2014   Lab Results  Component Value Date   LDLCALC 65 01/12/2016   LDLCALC 83 12/16/2010   LDLCALC See Comment mg/dL 09/02/2010   Lab Results  Component Value Date   TRIG 157.0* 01/12/2016   TRIG 354.0* 02/10/2015   TRIG 294.0* 10/30/2014   Lab Results  Component Value Date   CHOLHDL 3 01/12/2016   CHOLHDL 3 02/10/2015   CHOLHDL 3 10/30/2014   Lab Results  Component Value Date   LDLDIRECT 55.0 02/10/2015   LDLDIRECT 64.0 10/30/2014   LDLDIRECT 86.0 07/11/2013  he cut cheese out of his diet-made a difference in trig -down significantly  Very good profile !   bp is stable today  No cp or palpitations or headaches or edema  No side effects to medicines  BP Readings from Last 3  Encounters:  01/14/16 142/86  11/05/14 124/88  06/05/14 122/82    He checks it at home - 125/70 is his average  On lisinopril    Hx of gout On allopurinol   Chemistry      Component Value Date/Time   NA 142 01/12/2016 0824   NA 142 05/15/2009 0819   K 4.2 01/12/2016 0824   K 3.9 05/15/2009 0819   CL 105 01/12/2016 0824   CL 103 05/15/2009 0819   CO2 29 01/12/2016 0824   CO2 28 05/15/2009 0819   BUN 16 01/12/2016 0824   BUN 12 05/15/2009 0819   CREATININE 0.72 01/12/2016 0824   CREATININE 0.8 05/15/2009 0819      Component Value Date/Time   CALCIUM 9.2 01/12/2016 0824   CALCIUM 8.7 05/15/2009 0819   ALKPHOS 48 01/12/2016 0824   ALKPHOS 40 05/15/2009 0819   AST 26 01/12/2016 0824   AST 44* 05/15/2009 0819   ALT 25 01/12/2016 0824   ALT 53* 05/15/2009 0819   BILITOT 0.5 01/12/2016 0824   BILITOT 0.50 05/15/2009 0819     Lab Results  Component Value Date   LABURIC 4.8 01/12/2016   no gout flares on allopurinol  Hx of thrombocytopenia Lab Results  Component Value Date   WBC 5.1 01/12/2016   HGB 14.7 01/12/2016   HCT 43.1 01/12/2016   MCV 95.2 01/12/2016   PLT 108.0* 01/12/2016   no change from last year  No bleeding or bruising   Failed hearing test in L ear  He had discomfort  in it last night- he put drops in it (over the counter for wax)  Then could not hear well    Patient Active Problem List   Diagnosis Date Noted  . Cerumen impaction 01/14/2016  . Blurred vision 11/05/2014  . Prostate cancer screening 11/05/2014  . Family history of colon cancer 08/01/2013  . Colon cancer screening 08/01/2013  . Routine general medical examination at a health care facility 08/01/2013  . PSA, INCREASED 09/07/2010  . 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 Thrombocytopenia ([place]) 01/16/2009  . TRANSAMINASES, SERUM, ELEVATED 10/26/2007  . Hypertriglyceridemia 06/08/2007  . GOUT 06/08/2007  . Essential hypertension 06/08/2007  .  Hyperglycemia 06/08/2007  . SKIN CANCER, HX OF 06/08/2007   Past Medical History  Diagnosis Date  . History of skin cancer   . Gout   . Hyperlipidemia   . Hypertension     some white coat component- better at home  . Gastritis     H pylori, partial tx.  EGD 04/2002  . DDD (degenerative disc disease)     with epidural injection   Past Surgical History  Procedure Laterality Date  . Hemorrhoid surgery  1997   Social History  Substance Use Topics  . Smoking status: Never Smoker   . Smokeless tobacco: None  . Alcohol Use: 0.0 oz/week    0 Standard drinks or equivalent per week     Comment: occasional   Family History  Problem Relation Age of Onset  . Other Mother     brain tumor  . Skin cancer Mother     suspect melanoma  . Heart disease Father     MI  . Colon cancer Sister    No Known Allergies Current Outpatient Prescriptions on File Prior to Visit  Medication Sig Dispense Refill  . Ascorbic Acid (VITAMIN C) 1000 MG tablet Take 1,000 mg by mouth daily.      Marland Kitchen CALCIUM PO Take by mouth daily.      . Calcium-Magnesium-Zinc 1000-400-15 MG TABS Take 1 tablet by mouth daily.      . Omega-3 Fatty Acids (FISH OIL PO) Take one capsule by mouth once a day     . VITAMIN E PO Take by mouth daily.       No current facility-administered medications on file prior to visit.    Review of Systems    Review of Systems  Constitutional: Negative for fever, appetite change, fatigue and unexpected weight change.  Eyes: Negative for pain and visual disturbance.  ENT pos for hearing loss in L ear  Respiratory: Negative for cough and shortness of breath.   Cardiovascular: Negative for cp or palpitations    Gastrointestinal: Negative for nausea, diarrhea and constipation.  Genitourinary: Negative for urgency and frequency.  Skin: Negative for pallor or rash   Neurological: Negative for weakness, light-headedness, numbness and headaches.  Hematological: Negative for adenopathy. Does not  bruise/bleed easily.  Psychiatric/Behavioral: Negative for dysphoric mood. The patient is not nervous/anxious.      Objective:   Physical Exam  Constitutional: He appears well-developed and well-nourished. No distress.  Well appearing   HENT:  Head: Normocephalic and atraumatic.  Right Ear: External ear normal.  Left Ear: External ear normal.  Nose: Nose normal.  Mouth/Throat: Oropharynx is clear and moist.  Eyes: Conjunctivae and EOM are normal. Pupils are equal, round, and reactive to light. Right eye exhibits no discharge. Left eye exhibits no discharge. No scleral icterus.  Neck: Normal range of motion. Neck supple. No JVD present. Carotid bruit is not present. No thyromegaly present.  Cardiovascular: Normal rate, regular rhythm, normal heart sounds and intact distal pulses.  Exam reveals no gallop.   Pulmonary/Chest: Effort normal and breath sounds normal. No respiratory distress. He has no wheezes. He exhibits no tenderness.  Abdominal: Soft. Bowel sounds are normal. He exhibits no distension, no abdominal bruit and no mass. There is no tenderness.  Musculoskeletal: He exhibits no edema or tenderness.  Lymphadenopathy:    He has no cervical adenopathy.  Neurological: He is alert. He has normal reflexes. No cranial nerve deficit. He exhibits normal muscle tone. Coordination normal.  Skin: Skin is warm and dry. No rash noted. No erythema. No pallor.  Tanned with solar lentigines   Psychiatric: He has a normal mood and affect.          Assessment & Plan:   Problem List Items Addressed This Visit      Cardiovascular and Mediastinum   Essential hypertension - Primary    bp in fair control at this time  BP Readings from Last 1 Encounters:  01/14/16 130/80   No changes needed Disc lifstyle change with low sodium diet and exercise  Labs reviewed       Relevant Medications   lisinopril (PRINIVIL,ZESTRIL) 20 MG tablet   bisoprolol (ZEBETA) 5 MG tablet     Nervous and  Auditory   Cerumen impaction    Able to irrigate R ear with success but L ear was too sensitive to get cerumen out  Pt will work at home with an ear wax softening agent and return if no imp in 2 wk to try it again  I suspect hearing will improve with this resolved         Other   Colon cancer screening    Due for 3 year recall colonoscopy in 2018      GOUT    Uric acid level controlled with allopruinol and no flares  Continue to follow        Hyperglycemia    Stable and well controlled with diet Lab Results  Component Value Date   HGBA1C 5.7 01/12/2016   Will continue to follow Disc imp of low glycemic diet and wt control       Hypertriglyceridemia    Improved with better diet  Disc goals for lipids and reasons to control them Rev labs with pt Rev low sat fat diet in detail       Relevant Medications   lisinopril (PRINIVIL,ZESTRIL) 20 MG tablet   bisoprolol (ZEBETA) 5 MG tablet   PSA, INCREASED    This has resolved Lab Results  Component Value Date   PSA 1.08 01/12/2016   PSA 1.05 10/30/2014   PSA 1.00 07/11/2013   No prostate symptoms at all       Routine general medical examination at a health care facility    Reviewed health habits including diet and exercise and skin cancer prevention Reviewed appropriate screening tests for age  Also reviewed health mt list, fam hx and immunization status , as well as social and family history   See  HPI He will f/u with Katha Cabal for his [facility] visit - can hopefully re check hearing after cerumen impaction is resolved  Labs reviewed Please stop at check out to talk to [facility] about your next appointment If you are interested in a shingles/zoster vaccine - call your insurance to check on coverage,( you should not get it within 1 month of other vaccines) , then call us for a prescription  for it to take to a pharmacy that gives the shot , or make a nurse visit to get it here depending on your coverage Use the ear wax softening  drops regularly in the left ear - you can try flushing it your self - if no luck after 2 weeks- make an appointment here so we can try it again  R ear was cleared       Thrombocytopenia ([place])    Unchanged No symptoms Continue to watch

## 2016-01-15 NOTE — Assessment & Plan Note (Signed)
This has resolved Lab Results  Component Value Date   PSA 1.08 01/12/2016   PSA 1.05 10/30/2014   PSA 1.00 07/11/2013   No prostate symptoms at all

## 2016-01-15 NOTE — Assessment & Plan Note (Signed)
Due for 3 year recall colonoscopy in 2018

## 2016-01-15 NOTE — Assessment & Plan Note (Signed)
Improved with better diet  Disc goals for lipids and reasons to control them Rev labs with pt Rev low sat fat diet in detail  

## 2016-01-15 NOTE — Assessment & Plan Note (Signed)
bp in fair control at this time  BP Readings from Last 1 Encounters:  01/14/16 130/80   No changes needed Disc lifstyle change with low sodium diet and exercise  Labs reviewed

## 2016-01-15 NOTE — Assessment & Plan Note (Signed)
Uric acid level controlled with allopruinol and no flares  Continue to follow

## 2016-01-15 NOTE — Assessment & Plan Note (Signed)
Able to irrigate R ear with success but L ear was too sensitive to get cerumen out  Pt will work at home with an ear wax softening agent and return if no imp in 2 wk to try it again  I suspect hearing will improve with this resolved

## 2016-01-15 NOTE — Assessment & Plan Note (Signed)
Reviewed health habits including diet and exercise and skin cancer prevention Reviewed appropriate screening tests for age  Also reviewed health mt list, fam hx and immunization status , as well as social and family history   See HPI He will f/u with Katha Cabal for his AM[facility] visit - can hopefully re check hearing after cerumen impaction is resolved  Labs reviewed Please stop at check out to talk to [facility] about your next appointment If you are interested in a shingles/zoster vaccine - call your insurance to check on coverage,( you should not get it within 1 month of other vaccines) , then call us for a prescription  for it to take to a pharmacy that gives the shot , or make a nurse visit to get it here depending on your coverage Use the ear wax softening drops regularly in the left ear - you can try flushing it your self - if no luck after 2 weeks- make an appointment here so we can try it again  R ear was cleared

## 2016-01-15 NOTE — Assessment & Plan Note (Signed)
Stable and well controlled with diet Lab Results  Component Value Date   HGBA1C 5.7 01/12/2016   Will continue to follow Disc imp of low glycemic diet and wt control

## 2016-01-15 NOTE — Assessment & Plan Note (Signed)
Unchanged No symptoms Continue to watch

## 2016-01-21 ENCOUNTER — Ambulatory Visit (INDEPENDENT_AMBULATORY_CARE_PROVIDER_SITE_OTHER): Payer: PPO

## 2016-01-21 VITALS — BP 126/78 | HR 55 | Temp 97.8°F | Ht 72.0 in | Wt 178.8 lb

## 2016-01-21 DIAGNOSIS — Z0000 Encounter for general adult medical examination without abnormal findings: Secondary | ICD-10-CM

## 2016-01-21 NOTE — Progress Notes (Signed)
Subjective:   Jeffrey [name] is a 75 y.o. male who presents for an Initial Medicare Annual Wellness Visit.   Cardiac Risk Factors include: advanced age (>18men, >35 women);hypertension;male gender;dyslipidemia (hyperglycemia)    Objective:    Today's Vitals   01/21/16 0825  BP: 126/78  Pulse: 55  Temp: 97.8 F (36.6 C)  TempSrc: Oral  Height: 6' (1.829 m)  Weight: 178 lb 12 oz (81.08 kg)  SpO2: 95%   Body mass index is 24.24 kg/(m^2).  Current Medications (verified) Outpatient Encounter Prescriptions as of 01/21/2016  Medication Sig  . allopurinol (ZYLOPRIM) 300 MG tablet TAKE 1 TABLET(300 MG) BY MOUTH DAILY  . Ascorbic Acid (VITAMIN C) 1000 MG tablet Take 1,000 mg by mouth daily.    . bisoprolol (ZEBETA) 5 MG tablet Take 1.5 tablets (7.5 mg total) by mouth daily.  Marland Kitchen CALCIUM PO Take by mouth daily.    . Calcium-Magnesium-Zinc 1000-400-15 MG TABS Take 1 tablet by mouth daily.    Marland Kitchen lisinopril (PRINIVIL,ZESTRIL) 20 MG tablet TAKE 1 TABLET(20 MG) BY MOUTH DAILY  . Omega-3 Fatty Acids (FISH OIL PO) Take one capsule by mouth once a day   . VITAMIN E PO Take by mouth daily.     No facility-administered encounter medications on file as of 01/21/2016.    Allergies (verified) Review of patient's allergies indicates no known allergies.   History: Past Medical History  Diagnosis Date  . History of skin cancer   . Gout   . Hyperlipidemia   . Hypertension     some white coat component- better at home  . Gastritis     H pylori, partial tx.  EGD 04/2002  . DDD (degenerative disc disease)     with epidural injection   Past Surgical History  Procedure Laterality Date  . Hemorrhoid surgery  1997   Family History  Problem Relation Age of Onset  . Other Mother     brain tumor  . Skin cancer Mother     suspect melanoma  . Heart disease Father     MI  . Colon cancer Sister    Social History   Occupational History  . [employer]    Social History Main  Topics  . Smoking status: Never Smoker   . Smokeless tobacco: Not on file  . Alcohol Use: 0.0 oz/week    0 Standard drinks or equivalent per week     Comment: 1-2 drinks per week  . Drug Use: No  . Sexual Activity: No   Tobacco Counseling Counseling given: No   Activities of Daily Living In your present state of health, do you have any difficulty performing the following activities: 01/21/2016  Hearing? N  Vision? N  Difficulty concentrating or making decisions? N  Walking or climbing stairs? N  Dressing or bathing? N  Doing errands, shopping? N  Preparing Food and eating ? N  Using the Toilet? N  In the past six months, have you accidently leaked urine? N  Do you have problems with loss of bowel control? N  Managing your Medications? N  Managing your Finances? N  Housekeeping or managing your Housekeeping? N    Immunizations and Health Maintenance Immunization History  Administered Date(s) Administered  . Influenza Split 08/25/2011  . Influenza,inj,Quad PF,36+ Mos 08/01/2013, 11/05/2014  . Pneumococcal Conjugate-13 11/05/2014  . Pneumococcal Polysaccharide-23 08/25/2011  . Td 04/01/2003, 08/01/2013   There are no preventive care reminders to display for this patient.  Patient Care Team:  Abner Greenspan, MD as PCP - General  Dr. Gloriann Loan - Optometry     Assessment:   This is a routine wellness examination for Jeffrey [name].   Hearing/Vision screen  Hearing Screening   125Hz  250Hz  500Hz  1000Hz  2000Hz  4000Hz  8000Hz   Right ear:   0 0 40 0   Left ear:   40 40 40 0   Vision Screening Comments: Last eye exam in 03/2014 - 2 yr frequency  Dietary issues and exercise activities discussed: Current Exercise Habits: Home exercise routine, Type of exercise: Other - see comments (running, cycling, tennis), Time (Minutes): 60, Frequency (Times/Week): 5, Weekly Exercise (Minutes/Week): 300, Intensity: Moderate, Exercise limited by: None identified  Goals    . Have 3 meals a day      Starting 01/26/16, I will make a healthy protein choice for breakfast in order to avoid skipping meals.       Depression Screen PHQ 2/9 Scores 01/21/2016 01/14/2016 08/01/2013  PHQ - 2 Score 0 0 0    Fall Risk Fall Risk  01/21/2016 01/14/2016 08/01/2013  Falls in the past year? No No No    Cognitive Function: MMSE - Mini Mental State Exam 01/21/2016  Orientation to time 5  Orientation to Place 5  Registration 3  Attention/ Calculation 0  Recall 3  Language- name 2 objects 0  Language- repeat 1  Language- follow 3 step command 3  Language- read & follow direction 0  Write a sentence 0  Copy design 0  Total score 20   PLEASE NOTE: A Mini-Cog screen was completed. Maximum score is 20. A value of 0 denotes this part of [place]stein MMSE was not completed.  Orientation to Time - Max 5 Orientation to Place - Max 5 Registration - Max 3 Recall - Max 3 Language Repeat - Max 1 Language Follow 3 Step Command - Max 3  Screening Tests Health Maintenance  Topic Date Due  . ZOSTAVAX  01/09/2017 (Originally 04/26/2003)  . INFLUENZA VACCINE  05/11/2016  . TETANUS/TDAP  08/02/2023  . COLONOSCOPY  11/29/2023  . PNA vac Low Risk Adult  Completed        Plan:     I have personally reviewed and addressed the Medicare Annual Wellness questionnaire and have noted the following in the patient's chart:  A. Medical and social history B. Use of alcohol, tobacco or illicit drugs  C. Current medications and supplements D. Functional ability and status E.  Nutritional status F.  Physical activity G. Advance directives H. List of other physicians I.  Hospitalizations, surgeries, and ER visits in previous 12 months J.  [place] to include hearing, vision, cognitive, depression L. Referrals and appointments - none  In addition, I have reviewed and discussed with patient certain preventive protocols, quality metrics, and best practice recommendations. A written personalized care plan for  preventive services as well as general preventive health recommendations were provided to patient.  See attached scanned questionnaire for additional information.   Signed,   Lindell Noe, MHA, BS, LPN Health Advisor [id number]

## 2016-01-21 NOTE — Progress Notes (Signed)
   Subjective:    Patient ID: Jeffrey [name], male    DOB: Jun 12, 1941, 75 y.o.   MRN: [id number]  HPI    Review of Systems     Objective:   Physical Exam        Assessment & Plan:  I reviewed health advisor's note, was available for consultation, and agree with documentation and plan.

## 2016-01-21 NOTE — Patient Instructions (Signed)
Mr. Edmundson , Thank you for taking time to come for your Medicare Wellness Visit. I appreciate your ongoing commitment to your health goals. Please review the following plan we discussed and let me know if I can assist you in the future.   These are the goals we discussed: Goals    . Have 3 meals a day     Starting 01/26/16, I will make a healthy protein choice for breakfast in order to avoid skipping meals.        This is a list of the screening recommended for you and due dates:  Health Maintenance  Topic Date Due  . Shingles Vaccine  01/09/2017*  . Flu Shot  05/11/2016  . Tetanus Vaccine  08/02/2023  . Colon Cancer Screening  11/29/2023  . Pneumonia vaccines  Completed  *Topic was postponed. The date shown is not the original due date.      Preventive Care for Adults  A healthy lifestyle and preventive care can promote health and wellness. Preventive health guidelines for adults include the following key practices.  . A routine yearly physical is a good way to check with your health care provider about your health and preventive screening. It is a chance to share any concerns and updates on your health and to receive a thorough exam.  . Visit your dentist for a routine exam and preventive care every 6 months. Brush your teeth twice a day and floss once a day. Good oral hygiene prevents tooth decay and gum disease.  . The frequency of eye exams is based on your age, health, family medical history, use  of contact lenses, and other factors. Follow your health care provider's ecommendations for frequency of eye exams.  . Eat a healthy diet. Foods like vegetables, fruits, whole grains, low-fat dairy products, and lean protein foods contain the nutrients you need without too many calories. Decrease your intake of foods high in solid fats, added sugars, and salt. Eat the right amount of calories for you. Get information about a proper diet from your health care provider, if  necessary.  . Regular physical exercise is one of the most important things you can do for your health. Most adults should get at least 150 minutes of moderate-intensity exercise (any activity that increases your heart rate and causes you to sweat) each week. In addition, most adults need muscle-strengthening exercises on 2 or more days a week.  Silver Sneakers may be a benefit available to you. To determine eligibility, you may visit the website: www.silversneakers.com or contact program at 912 398 7785 Mon-Fri between 8AM-8PM.   . Maintain a healthy weight. The body mass index (BMI) is a screening tool to identify possible weight problems. It provides an estimate of body fat based on height and weight. Your health care provider can find your BMI and can help you achieve or maintain a healthy weight.   For adults 20 years and older: ? A BMI below 18.5 is considered underweight. ? A BMI of 18.5 to 24.9 is normal. ? A BMI of 25 to 29.9 is considered overweight. ? A BMI of 30 and above is considered obese.   . Maintain normal blood lipids and cholesterol levels by exercising and minimizing your intake of saturated fat. Eat a balanced diet with plenty of fruit and vegetables. Blood tests for lipids and cholesterol should begin at age 61 and be repeated every 5 years. If your lipid or cholesterol levels are high, you are over 50, or you are  at high risk for heart disease, you may need your cholesterol levels checked more frequently. Ongoing high lipid and cholesterol levels should be treated with medicines if diet and exercise are not working.  . If you smoke, find out from your health care provider how to quit. If you do not use tobacco, please do not start.  . If you choose to drink alcohol, please do not consume more than 2 drinks per day. One drink is considered to be 12 ounces (355 mL) of beer, 5 ounces (148 mL) of wine, or 1.5 ounces (44 mL) of liquor.  . If you are 62-1 years old, ask your  health care provider if you should take aspirin to prevent strokes.  . Use sunscreen. Apply sunscreen liberally and repeatedly throughout the day. You should seek shade when your shadow is shorter than you. Protect yourself by wearing long sleeves, pants, a wide-brimmed hat, and sunglasses year round, whenever you are outdoors.  . Once a month, do a whole body skin exam, using a mirror to look at the skin on your back. Tell your health care provider of new moles, moles that have irregular borders, moles that are larger than a pencil eraser, or moles that have changed in shape or color.

## 2016-01-21 NOTE — Progress Notes (Signed)
Pre visit review using our clinic review tool, if applicable. No additional management support is needed unless otherwise documented below in the visit note. 

## 2016-03-10 ENCOUNTER — Ambulatory Visit (INDEPENDENT_AMBULATORY_CARE_PROVIDER_SITE_OTHER): Payer: PPO | Admitting: Primary Care

## 2016-03-10 ENCOUNTER — Ambulatory Visit (INDEPENDENT_AMBULATORY_CARE_PROVIDER_SITE_OTHER)
Admission: RE | Admit: 2016-03-10 | Discharge: 2016-03-10 | Disposition: A | Discharge status: Home / Self Care | Payer: PPO | Source: Ambulatory Visit | Attending: Primary Care | Admitting: Primary Care

## 2016-03-10 VITALS — BP 148/88 | HR 52 | Temp 97.5°F | Ht 71.5 in | Wt 176.0 lb

## 2016-03-10 DIAGNOSIS — R1032 Left lower quadrant pain: Secondary | ICD-10-CM

## 2016-03-10 LAB — CBC WITH DIFFERENTIAL/PLATELET
BASOS ABS: 0 10*3/uL (ref 0.0–0.1)
Basophils Relative: 0.7 % (ref 0.0–3.0)
EOS PCT: 5.4 % — AB (ref 0.0–5.0)
Eosinophils Absolute: 0.3 10*3/uL (ref 0.0–0.7)
HCT: 42.9 % (ref 39.0–52.0)
HEMOGLOBIN: 14.5 g/dL (ref 13.0–17.0)
LYMPHS PCT: 18 % (ref 12.0–46.0)
Lymphs Abs: 1.1 10*3/uL (ref 0.7–4.0)
MCHC: 33.8 g/dL (ref 30.0–36.0)
MCV: 95.7 fl (ref 78.0–100.0)
MONOS PCT: 8.2 % (ref 3.0–12.0)
Monocytes Absolute: 0.5 10*3/uL (ref 0.1–1.0)
Neutro Abs: 4 10*3/uL (ref 1.4–7.7)
Neutrophils Relative %: 67.7 % (ref 43.0–77.0)
Platelets: 126 10*3/uL — ABNORMAL LOW (ref 150.0–400.0)
RBC: 4.48 Mil/uL (ref 4.22–5.81)
RDW: 13.2 % (ref 11.5–15.5)
WBC: 5.9 10*3/uL (ref 4.0–10.5)

## 2016-03-10 LAB — COMPREHENSIVE METABOLIC PANEL
ALBUMIN: 4.1 g/dL (ref 3.5–5.2)
ALK PHOS: 52 U/L (ref 39–117)
ALT: 34 U/L (ref 0–53)
AST: 32 U/L (ref 0–37)
BILIRUBIN TOTAL: 0.6 mg/dL (ref 0.2–1.2)
BUN: 14 mg/dL (ref 6–23)
CO2: 28 mEq/L (ref 19–32)
Calcium: 8.9 mg/dL (ref 8.4–10.5)
Chloride: 105 mEq/L (ref 96–112)
Creatinine, Ser: 0.7 mg/dL (ref 0.40–1.50)
GFR: 117.54 mL/min (ref >60.00)
Glucose, Bld: 120 mg/dL — ABNORMAL HIGH (ref 70–99)
POTASSIUM: 4 meq/L (ref 3.5–5.1)
Sodium: 139 mEq/L (ref 135–145)
Total Protein: 6.4 g/dL (ref 6.0–8.3)

## 2016-03-10 NOTE — Progress Notes (Signed)
Subjective:    Patient ID: Jeffrey [name], male    DOB: [birth year], 75 y.o.   MRN: [id number]  HPI  Jeffrey [name] is a 75 year old male who presents today with a chief complaint of abdominal pain. His pain is located to the LLQ and has been present for the past 5 days. His pain is intermittent, is no better, is no worse. His pain does not present at any particular time (i.e. eating, drinking, movement, etc.)   Denies nausea, vomiting, fevers, diarrhea, constipation. His last bowel movement was last night and was normal. He is eating and drinking without difficulty or decrease in appetite. Colonoscopy completed in 2015 without evidence of diverticulosis. He's not tried taking anything OTC for his symptoms.  Review of Systems  Constitutional: Negative for appetite change and unexpected weight change.  Gastrointestinal: Positive for abdominal pain. Negative for nausea, vomiting, diarrhea, constipation and blood in stool.  Neurological: Negative for weakness.       Past Medical History  Diagnosis Date  . History of skin cancer   . Gout   . Hyperlipidemia   . Hypertension     some white coat component- better at home  . Gastritis     H pylori, partial tx.  EGD 04/2002  . DDD (degenerative disc disease)     with epidural injection     Social History   Social History  . Marital Status: Married    Spouse Name: N/A  . Number of Children: 3  . Years of Education: N/A   Occupational History  . [employer]    Social History Main Topics  . Smoking status: Never Smoker   . Smokeless tobacco: Not on file  . Alcohol Use: 0.0 oz/week    0 Standard drinks or equivalent per week     Comment: 1-2 drinks per week  . Drug Use: No  . Sexual Activity: No   Other Topics Concern  . Not on file   Social History Narrative   From [place]    Past Surgical History  Procedure Laterality Date  . Hemorrhoid surgery  1997    Family History  Problem Relation Age of Onset  .  Other Mother     brain tumor  . Skin cancer Mother     suspect melanoma  . Heart disease Father     MI  . Colon cancer Sister     No Known Allergies  Current Outpatient Prescriptions on File Prior to Visit  Medication Sig Dispense Refill  . allopurinol (ZYLOPRIM) 300 MG tablet TAKE 1 TABLET(300 MG) BY MOUTH DAILY 90 tablet 3  . Ascorbic Acid (VITAMIN C) 1000 MG tablet Take 1,000 mg by mouth daily.      . bisoprolol (ZEBETA) 5 MG tablet Take 1.5 tablets (7.5 mg total) by mouth daily. 135 tablet 3  . CALCIUM PO Take by mouth daily.      . Calcium-Magnesium-Zinc 1000-400-15 MG TABS Take 1 tablet by mouth daily.      Marland Kitchen lisinopril (PRINIVIL,ZESTRIL) 20 MG tablet TAKE 1 TABLET(20 MG) BY MOUTH DAILY 90 tablet 3  . Omega-3 Fatty Acids (FISH OIL PO) Take one capsule by mouth once a day     . VITAMIN E PO Take by mouth daily.       No current facility-administered medications on file prior to visit.    BP 148/88 mmHg  Pulse 52  Temp(Src) 97.5 F (36.4 C) (Oral)  Ht 5' 11.5" (1.816 m)  Wt 176 lb (79.833 kg)  BMI 24.21 kg/m2  SpO2 97%    Objective:   Physical Exam  Constitutional: He appears well-nourished. He does not appear ill.  Cardiovascular: Normal rate and regular rhythm.   Pulmonary/Chest: Effort normal and breath sounds normal.  Abdominal: Soft. Bowel sounds are normal. There is tenderness in the left lower quadrant. There is no rebound, no tenderness at McBurney's point and negative Murphy's sign.  Skin: Skin is warm and dry.          Assessment & Plan:  Abdominal Pain:  Located to LLQ x 5 days, not associated with movement, eating, drinking. No alarm signs. Dose not appear acutely ill. Exam today with mild-moderate tenderness over LLQ, otherwise unremarkable. Xray of abdomen today without evidence of blockage or stool burden. CBC CMP unremarkable. Colonoscopy from 2015 reviewed and is without evidence of diverticulosis. If symptoms persist, may need to  consider CT scan of abdomen.pelvis. Patient to notify me if no improvement.

## 2016-03-10 NOTE — Progress Notes (Signed)
Pre visit review using our clinic review tool, if applicable. No additional management support is needed unless otherwise documented below in the visit note. 

## 2016-03-10 NOTE — Patient Instructions (Addendum)
Complete lab work prior to leaving today. I will notify you of your results once received.   Complete xray(s) prior to leaving today. I will notify you of your results once received.  Ensure you are staying hydrated with water to prevent dehydration and constipation.  It was a pleasure meeting you!

## 2016-04-27 ENCOUNTER — Ambulatory Visit (INDEPENDENT_AMBULATORY_CARE_PROVIDER_SITE_OTHER): Payer: PPO | Admitting: Family Medicine

## 2016-04-27 ENCOUNTER — Encounter: Payer: Self-pay | Admitting: Family Medicine

## 2016-04-27 VITALS — BP 128/68 | HR 63 | Temp 98.0°F | Wt 177.2 lb

## 2016-04-27 DIAGNOSIS — R350 Frequency of micturition: Secondary | ICD-10-CM | POA: Insufficient documentation

## 2016-04-27 DIAGNOSIS — R300 Dysuria: Secondary | ICD-10-CM | POA: Diagnosis not present

## 2016-04-27 LAB — POC URINALSYSI DIPSTICK (AUTOMATED)
Bilirubin, UA: NEGATIVE
Glucose, UA: NEGATIVE
Ketones, UA: NEGATIVE
Leukocytes, UA: NEGATIVE
Nitrite, UA: NEGATIVE
PH UA: 6
PROTEIN UA: POSITIVE
RBC UA: NEGATIVE
UROBILINOGEN UA: 0.2

## 2016-04-27 LAB — COMPREHENSIVE METABOLIC PANEL
ALBUMIN: 3.8 g/dL (ref 3.5–5.2)
ALK PHOS: 63 U/L (ref 39–117)
ALT: 21 U/L (ref 0–53)
AST: 16 U/L (ref 0–37)
BILIRUBIN TOTAL: 1 mg/dL (ref 0.2–1.2)
BUN: 12 mg/dL (ref 6–23)
CO2: 29 mEq/L (ref 19–32)
Calcium: 8.7 mg/dL (ref 8.4–10.5)
Chloride: 104 mEq/L (ref 96–112)
Creatinine, Ser: 0.74 mg/dL (ref 0.40–1.50)
GFR: 110.19 mL/min (ref >60.00)
GLUCOSE: 150 mg/dL — AB (ref 70–99)
Potassium: 3.8 mEq/L (ref 3.5–5.1)
Sodium: 139 mEq/L (ref 135–145)
TOTAL PROTEIN: 6.1 g/dL (ref 6.0–8.3)

## 2016-04-27 LAB — PSA: PSA: 28.79 ng/mL — AB (ref 0.10–4.00)

## 2016-04-27 MED ORDER — CIPROFLOXACIN HCL 500 MG PO TABS: 500.0000 mg | ORAL_TABLET | Freq: Two times a day (BID) | ORAL | Status: DC

## 2016-04-27 NOTE — Progress Notes (Signed)
Subjective:   Patient ID: Jeffrey [name], male    DOB: 1941-09-30, 75 y.o.   MRN: [id number]  Jeffrey [name] is a pleasant 75 y.o. year old male pt of Dr. Glori Bickers, new to me, who presents to clinic today with Urinary Frequency and Dysuria  on 04/27/2016  HPI:  Several days of dysuria, chills (no fever), increased urinary frequency.  No hematuria.  No back pain.  Mild suprapubic pain.  No history of kidney stones.  Current Outpatient Prescriptions on File Prior to Visit  Medication Sig Dispense Refill  . allopurinol (ZYLOPRIM) 300 MG tablet TAKE 1 TABLET(300 MG) BY MOUTH DAILY 90 tablet 3  . Ascorbic Acid (VITAMIN C) 1000 MG tablet Take 1,000 mg by mouth daily.      . bisoprolol (ZEBETA) 5 MG tablet Take 1.5 tablets (7.5 mg total) by mouth daily. 135 tablet 3  . CALCIUM PO Take by mouth daily.      . Calcium-Magnesium-Zinc 1000-400-15 MG TABS Take 1 tablet by mouth daily.      Marland Kitchen lisinopril (PRINIVIL,ZESTRIL) 20 MG tablet TAKE 1 TABLET(20 MG) BY MOUTH DAILY 90 tablet 3  . Omega-3 Fatty Acids (FISH OIL PO) Take one capsule by mouth once a day     . VITAMIN E PO Take by mouth daily.       No current facility-administered medications on file prior to visit.    No Known Allergies  Past Medical History  Diagnosis Date  . History of skin cancer   . Gout   . Hyperlipidemia   . Hypertension     some white coat component- better at home  . Gastritis     H pylori, partial tx.  EGD 04/2002  . DDD (degenerative disc disease)     with epidural injection    Past Surgical History  Procedure Laterality Date  . Hemorrhoid surgery  1997    Family History  Problem Relation Age of Onset  . Other Mother     brain tumor  . Skin cancer Mother     suspect melanoma  . Heart disease Father     MI  . Colon cancer Sister     Social History   Social History  . Marital Status: Married    Spouse Name: N/A  . Number of Children: 3  . Years of Education: N/A   Occupational History  .  [employer]    Social History Main Topics  . Smoking status: Never Smoker   . Smokeless tobacco: Not on file  . Alcohol Use: 0.0 oz/week    0 Standard drinks or equivalent per week     Comment: 1-2 drinks per week  . Drug Use: No  . Sexual Activity: No   Other Topics Concern  . Not on file   Social History Narrative   From [place]   [facility] [place], [place], Social History, Family History, Medications, and allergies have been reviewed in [facility], and have been updated if relevant.   Review of Systems  Constitutional: Positive for chills. Negative for fever.  Gastrointestinal: Negative for nausea and diarrhea.  Genitourinary: Positive for dysuria, urgency and frequency. Negative for hematuria, flank pain, decreased urine volume, discharge, penile swelling, scrotal swelling, penile pain and testicular pain.  Musculoskeletal: Negative for back pain.  All other systems reviewed and are negative.      Objective:    BP 128/68 mmHg  Pulse 63  Temp(Src) 98 F (36.7 C) (Oral)  Wt 177 lb 4  oz (80.4 kg)  SpO2 95%   Physical Exam  Constitutional: He is oriented to person, place, and time. He appears well-developed and well-nourished. No distress.  HENT:  Head: Normocephalic.  Abdominal: Soft. Bowel sounds are normal. He exhibits no distension and no mass. There is no tenderness. There is no rebound and no guarding.  Neurological: He is alert and oriented to person, place, and time. No cranial nerve deficit.  Skin: Skin is warm and dry. He is not diaphoretic.  Psychiatric: He has a normal mood and affect. His behavior is normal. Judgment and thought content normal.  Nursing note and vitals reviewed.         Assessment & Plan:   Dysuria - Plan: POCT Urinalysis Dipstick (Automated) No Follow-up on file.

## 2016-04-27 NOTE — Addendum Note (Signed)
Addended by: Modena Nunnery on: 04/27/2016 12:19 PM   Modules accepted: Orders, SmartSet

## 2016-04-27 NOTE — Assessment & Plan Note (Signed)
With dysuria and chills. UA neg- ? Prostatitis. Will place on cipro 500 mg twice daily and advise follow up with PCP later this week. If symptoms progress, I advised pt that I would like to order imaging to rule out nephrolithiasis. Labs today- Orders Placed This Encounter  Procedures  . CBC with Differential/Platelet  . PSA  . Comprehensive metabolic panel  . POCT Urinalysis Dipstick (Automated)   The patient indicates understanding of these issues and agrees with the plan.

## 2016-04-27 NOTE — Progress Notes (Signed)
Pre visit review using our clinic review tool, if applicable. No additional management support is needed unless otherwise documented below in the visit note. 

## 2016-04-27 NOTE — Addendum Note (Signed)
Addended by: Daralene Milch C on: 04/27/2016 11:46 AM   Modules accepted: SmartSet

## 2016-04-27 NOTE — Patient Instructions (Signed)
Good to see you. I will call you with your lab results.  Please take cipro as directed- 1 tablet twice daily for 10 days.  Follow up with Dr. Glori Bickers later this week.

## 2016-04-28 ENCOUNTER — Other Ambulatory Visit: Payer: Self-pay | Admitting: Family Medicine

## 2016-04-28 DIAGNOSIS — N410 Acute prostatitis: Secondary | ICD-10-CM

## 2016-04-28 LAB — CBC WITH DIFFERENTIAL/PLATELET
BASOS PCT: 0.4 % (ref 0.0–3.0)
Basophils Absolute: 0 10*3/uL (ref 0.0–0.1)
EOS PCT: 1.2 % (ref 0.0–5.0)
Eosinophils Absolute: 0.1 10*3/uL (ref 0.0–0.7)
HEMATOCRIT: 41 % (ref 39.0–52.0)
HEMOGLOBIN: 13.8 g/dL (ref 13.0–17.0)
LYMPHS PCT: 6.3 % — AB (ref 12.0–46.0)
Lymphs Abs: 0.7 10*3/uL (ref 0.7–4.0)
MCHC: 33.6 g/dL (ref 30.0–36.0)
MCV: 97.2 fl (ref 78.0–100.0)
MONO ABS: 0.6 10*3/uL (ref 0.1–1.0)
MONOS PCT: 5.3 % (ref 3.0–12.0)
Neutro Abs: 9.7 10*3/uL — ABNORMAL HIGH (ref 1.4–7.7)
Neutrophils Relative %: 86.8 % — ABNORMAL HIGH (ref 43.0–77.0)
Platelets: 96 10*3/uL — ABNORMAL LOW (ref 150.0–400.0)
RBC: 4.21 Mil/uL — AB (ref 4.22–5.81)
RDW: 13.3 % (ref 11.5–15.5)
WBC: 11.1 10*3/uL — AB (ref 4.0–10.5)

## 2016-04-29 LAB — URINE CULTURE

## 2016-04-30 DIAGNOSIS — N410 Acute prostatitis: Secondary | ICD-10-CM | POA: Diagnosis not present

## 2016-04-30 DIAGNOSIS — R972 Elevated prostate specific antigen [PSA]: Secondary | ICD-10-CM | POA: Diagnosis not present

## 2016-07-11 DIAGNOSIS — Z1389 Encounter for screening for other disorder: Secondary | ICD-10-CM | POA: Diagnosis not present

## 2016-07-11 DIAGNOSIS — N410 Acute prostatitis: Secondary | ICD-10-CM | POA: Diagnosis not present

## 2016-07-21 ENCOUNTER — Ambulatory Visit (INDEPENDENT_AMBULATORY_CARE_PROVIDER_SITE_OTHER): Payer: PPO | Admitting: Family Medicine

## 2016-07-21 ENCOUNTER — Encounter: Payer: Self-pay | Admitting: Family Medicine

## 2016-07-21 VITALS — BP 122/72 | HR 57 | Temp 97.9°F | Ht 71.0 in | Wt 182.8 lb

## 2016-07-21 DIAGNOSIS — R8279 Other abnormal findings on microbiological examination of urine: Secondary | ICD-10-CM | POA: Diagnosis not present

## 2016-07-21 DIAGNOSIS — R972 Elevated prostate specific antigen [PSA]: Secondary | ICD-10-CM | POA: Diagnosis not present

## 2016-07-21 DIAGNOSIS — Z7184 Encounter for health counseling related to travel: Secondary | ICD-10-CM

## 2016-07-21 DIAGNOSIS — Z7189 Other specified counseling: Secondary | ICD-10-CM | POA: Diagnosis not present

## 2016-07-21 DIAGNOSIS — Z23 Encounter for immunization: Secondary | ICD-10-CM | POA: Diagnosis not present

## 2016-07-21 DIAGNOSIS — N410 Acute prostatitis: Secondary | ICD-10-CM

## 2016-07-21 MED ORDER — CIPROFLOXACIN HCL 500 MG PO TABS: 500.0000 mg | ORAL_TABLET | Freq: Every day | ORAL | 0 refills | Status: DC

## 2016-07-21 NOTE — Progress Notes (Signed)
Pre visit review using our clinic review tool, if applicable. No additional management support is needed unless otherwise documented below in the visit note. 

## 2016-07-21 NOTE — Progress Notes (Signed)
Subjective:    Patient ID: Jeffrey [name], male    DOB: [birth year], 75 y.o.   MRN: [id number]  HPI Here for f/u from urgent care   Was seen on 10/4 and tx for acute prostatitis  Put him on cipro  Had low grade fever and some urinary retention -frequency  He knew the symptoms for prostate infectoin  Still on med  Feeling better   They did call pt back with cx result- he had bacteria (? What kind)   Of note this has been chronic in the past Saw urology in July after a bout-put on cipro 1 pill daily for 30 days  Had 2+ prostate size at the time and elevated psa  Lab Results  Component Value Date   PSA 28.79 (H) 04/27/2016   PSA 1.08 01/12/2016   PSA 1.05 10/30/2014   thought to be due to infection   Has not returned to the urologist yet  Was due for another PSA   Just under 2 mo of no symptoms off abx    No worries about STDs at all   No blood in urine  No hx of renal stones Lab Results  Component Value Date   CREATININE 0.74 04/27/2016   BUN 12 04/27/2016   NA 139 04/27/2016   K 3.8 04/27/2016   CL 104 04/27/2016   CO2 29 04/27/2016    Drinks water - 5-6 glasses per day-can increase that  Nocturia once baseline  No frequency or urgency at all  Is able to empty well   No prostate cancer in the family   Going to [place] and [place] -end of the month  Never been immunized for hep B or hep A    Patient Active Problem List   Diagnosis Date Noted  . Counseling for travel 07/21/2016  . Acute prostatitis 07/21/2016  . Urine culture positive 07/21/2016  . Dysuria 04/27/2016  . Urinary frequency 04/27/2016  . Cerumen impaction 01/14/2016  . Blurred vision 11/05/2014  . Prostate cancer screening 11/05/2014  . Family history of colon cancer 08/01/2013  . Colon cancer screening 08/01/2013  . Routine general medical examination at a health care facility 08/01/2013  . PSA, INCREASED 09/07/2010  . 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 LUMBAR RADICULOPATHY,  RIGHT 11/24/2009  . Thrombocytopenia ([place]) 01/16/2009  . TRANSAMINASES, SERUM, ELEVATED 10/26/2007  . Hypertriglyceridemia 06/08/2007  . GOUT 06/08/2007  . Essential hypertension 06/08/2007  . Hyperglycemia 06/08/2007  . SKIN CANCER, HX OF 06/08/2007   Past Medical History:  Diagnosis Date  . DDD (degenerative disc disease)    with epidural injection  . Gastritis    H pylori, partial tx.  EGD 04/2002  . Gout   . History of skin cancer   . Hyperlipidemia   . Hypertension    some white coat component- better at home   Past Surgical History:  Procedure Laterality Date  . [place]   Social History  Substance Use Topics  . Smoking status: Never Smoker  . Smokeless tobacco: Not on file  . Alcohol use 0.0 oz/week     Comment: 1-2 drinks per week   Family History  Problem Relation Age of Onset  . Other Mother     brain tumor  . Skin cancer Mother     suspect melanoma  . Heart disease Father     MI  . Colon cancer Sister    No Known Allergies Current Outpatient Prescriptions on File Prior to Visit  Medication Sig Dispense Refill  . allopurinol (ZYLOPRIM) 300 MG tablet TAKE 1 TABLET(300 MG) BY MOUTH DAILY 90 tablet 3  . Ascorbic Acid (VITAMIN C) 1000 MG tablet Take 1,000 mg by mouth daily.      . bisoprolol (ZEBETA) 5 MG tablet Take 1.5 tablets (7.5 mg total) by mouth daily. 135 tablet 3  . CALCIUM PO Take by mouth daily.      . Calcium-Magnesium-Zinc 1000-400-15 MG TABS Take 1 tablet by mouth daily.      . ciprofloxacin (CIPRO) 500 MG tablet Take 1 tablet (500 mg total) by mouth 2 (two) times daily. 20 tablet 0  . lisinopril (PRINIVIL,ZESTRIL) 20 MG tablet TAKE 1 TABLET(20 MG) BY MOUTH DAILY 90 tablet 3  . Omega-3 Fatty Acids (FISH OIL PO) Take one capsule by mouth once a day     . VITAMIN E PO Take by mouth daily.       No current facility-administered medications on file prior to visit.     Review of Systems    Review of Systems  Constitutional:  Negative for fever, appetite change, fatigue and unexpected weight change.  Eyes: Negative for pain and visual disturbance.  Respiratory: Negative for cough and shortness of breath.   Cardiovascular: Negative for cp or palpitations    Gastrointestinal: Negative for nausea, diarrhea and constipation.  Genitourinary: Negative for urgency and frequency. Neg for dysuria or hematuria , neg for flank pain  Skin: Negative for pallor or rash   Neurological: Negative for weakness, light-headedness, numbness and headaches.  Hematological: Negative for adenopathy. Does not bruise/bleed easily.  Psychiatric/Behavioral: Negative for dysphoric mood. The patient is not nervous/anxious.      Objective:   Physical Exam  Constitutional: He appears well-developed and well-nourished. No distress.  Well appearing   HENT:  Head: Normocephalic and atraumatic.  Eyes: Conjunctivae and EOM are normal. Pupils are equal, round, and reactive to light.  Neck: Normal range of motion. Neck supple.  Cardiovascular: Normal rate, regular rhythm and normal heart sounds.   Pulmonary/Chest: Effort normal and breath sounds normal.  Abdominal: Soft. Bowel sounds are normal. He exhibits no distension, no abdominal bruit, no pulsatile midline mass and no mass. There is no hepatosplenomegaly. There is no tenderness. There is no rebound, no guarding, no CVA tenderness, no tenderness at McBurney's point and negative Murphy's sign.  No cva tenderness  Mild suprapubic tenderness  Musculoskeletal: He exhibits no edema.  Lymphadenopathy:    He has no cervical adenopathy.  Neurological: He is alert.  Skin: No rash noted. No pallor.  Psychiatric: He has a normal mood and affect.          Assessment & Plan:   Problem List Items Addressed This Visit      Genitourinary   Acute prostatitis    Has had several bouts Recently tx at [facility]- rev notes/pending urine cx Finishing cipro 500 bid  Will continue qd cipro for travel out of  the country in light of hx of recurrence  No c/o today  No symptoms of BPH currently  Urine cx pending        Relevant Orders   PSA (Completed)     Other   Counseling for travel    Pt is traveling to [place] and [place]  Flu shot and hep A vaccine today  May need typhoid vaccine and may need malaria prophylaxis  He will make an appt at his county health dept for further counseling and imms  PSA, INCREASED - Primary    Re check today  Prev elevated by prostatitis  Now coming off of a flare-improved/ re check  Rev urology note today       Relevant Orders   PSA (Completed)   Urine culture positive    Recent /urgent care ux (treated for prostatitis) Sent for this result On cipro  Repeat cx today He has symptomatic relief       Relevant Orders   Urine culture    Other Visit Diagnoses    Encounter for immunization       Relevant Orders   Flu Vaccine QUAD 36+ mos IM (Completed)

## 2016-07-21 NOTE — Patient Instructions (Addendum)
Flu shot and hep A vaccines today  Make an appt. At the travel clinic at your county health dept to discuss other immunizations for the area you are traveling to  Urine culture today and I will send for your urine culture from the urgent care  Repeat PSA today  When you finish your current cipro - continue once daily through your trip

## 2016-07-22 LAB — PSA: PSA: 7.37 ng/mL — AB (ref 0.10–4.00)

## 2016-07-22 LAB — URINE CULTURE: ORGANISM ID, BACTERIA: NO GROWTH

## 2016-07-22 NOTE — Assessment & Plan Note (Signed)
Recent /urgent care ux (treated for prostatitis) Sent for this result On cipro  Repeat cx today He has symptomatic relief

## 2016-07-22 NOTE — Assessment & Plan Note (Signed)
Pt is traveling to [place] and [place]  Flu shot and hep A vaccine today  May need typhoid vaccine and may need malaria prophylaxis  He will make an appt at his county health dept for further counseling and imms

## 2016-07-22 NOTE — Assessment & Plan Note (Signed)
Re check today  Prev elevated by prostatitis  Now coming off of a flare-improved/ re check  Rev urology note today

## 2016-07-22 NOTE — Assessment & Plan Note (Signed)
Has had several bouts Recently tx at [facility]- rev notes/pending urine cx Finishing cipro 500 bid  Will continue qd cipro for travel out of the country in light of hx of recurrence  No c/o today  No symptoms of BPH currently  Urine cx pending

## 2016-07-28 ENCOUNTER — Telehealth: Payer: Self-pay

## 2016-07-28 NOTE — Telephone Encounter (Signed)
pts daughter wants pt to get cholera and Typhoid vaccinations when pt is in [place]. pts daughter is going to cb with the pharmacy in [place];(pt leaving for [place] today at 5 PM) that will give pt cholera,typhoid immunizations and will ask Dr Glori Bickers to send order for vaccinations to that pharmacy. FYI to Dr Glori Bickers.

## 2016-07-28 NOTE — Telephone Encounter (Signed)
This message was moved to the pt's chart

## 2016-07-28 NOTE — Telephone Encounter (Signed)
pts daughter left v/m that pt wants to get typhoid injectable immunization at [facility] [place]. Request order sent to [facility] in [place]. pts daughter request cb when done.

## 2016-07-29 NOTE — Telephone Encounter (Signed)
Spoke with pt's daughter and she just wants to make sure that you send in the Rx for the injection not the oral vaccine, also the pharmacist is giving the vaccines not a clinic

## 2016-08-03 ENCOUNTER — Encounter: Payer: Self-pay | Admitting: *Deleted

## 2016-10-15 DIAGNOSIS — Z01 Encounter for other special examination without complaint, suspected or reported diagnosis: Secondary | ICD-10-CM | POA: Diagnosis not present

## 2016-10-18 DIAGNOSIS — Z01 Encounter for other special examination without complaint, suspected or reported diagnosis: Secondary | ICD-10-CM | POA: Diagnosis not present

## 2016-10-20 DIAGNOSIS — Z01 Encounter for other special examination without complaint, suspected or reported diagnosis: Secondary | ICD-10-CM | POA: Diagnosis not present

## 2016-10-27 DIAGNOSIS — Z01 Encounter for other special examination without complaint, suspected or reported diagnosis: Secondary | ICD-10-CM | POA: Diagnosis not present

## 2016-11-03 ENCOUNTER — Ambulatory Visit (INDEPENDENT_AMBULATORY_CARE_PROVIDER_SITE_OTHER): Payer: PPO | Admitting: Family Medicine

## 2016-11-03 ENCOUNTER — Telehealth: Payer: Self-pay | Admitting: Family Medicine

## 2016-11-03 VITALS — BP 138/82 | HR 56 | Temp 97.6°F | Wt 179.5 lb

## 2016-11-03 DIAGNOSIS — J0110 Acute frontal sinusitis, unspecified: Secondary | ICD-10-CM | POA: Diagnosis not present

## 2016-11-03 MED ORDER — ATORVASTATIN CALCIUM 20 MG PO TABS: 20.0000 mg | ORAL_TABLET | Freq: Every day | ORAL | 3 refills | Status: DC

## 2016-11-03 MED ORDER — AMOXICILLIN-POT CLAVULANATE 875-125 MG PO TABS: 1.0000 | ORAL_TABLET | Freq: Two times a day (BID) | ORAL | 0 refills | Status: AC

## 2016-11-03 MED ORDER — PREDNISONE 20 MG PO TABS: ORAL_TABLET | ORAL | 0 refills | Status: DC

## 2016-11-03 NOTE — Patient Instructions (Signed)
Great to see you.  Please take Augmentin 1 tablet twice daily x 10 days and prednisone.

## 2016-11-03 NOTE — Telephone Encounter (Signed)
Patient came by my office to request Referrals to see a Neurologist and a  Cardiologist. He was visiting [place] and on 10/14/16 he had a stroke. He brought medical records back with him and has images as well. I made copies that he gave me and I will put them in your inbow, however it is all in [place]. I made him an appt with you for this Monday at 4pm and told him you would be able to take care of his Referrals at that time.

## 2016-11-03 NOTE — Progress Notes (Signed)
SUBJECTIVE:  Jeffrey [name] is a 76 y.o. male pt of Dr. Glori Bickers, who complains of coryza, congestion, sneezing and bilateral sinus pain for 14 days. He denies a history of anorexia and chest pain and denies a history of asthma. Patient denies smoke cigarettes.   Complicated history.  Had a stroke while he was in [place] earlier this month.  Was placed on ASA and a statin.  While he was in the hospital for the stroke, they gave him levaquin for a URI.  He feels congestion in his face and chest has persisted.  No fevers.  Current Outpatient Prescriptions on File Prior to Visit  Medication Sig Dispense Refill  . allopurinol (ZYLOPRIM) 300 MG tablet TAKE 1 TABLET(300 MG) BY MOUTH DAILY 90 tablet 3  . Ascorbic Acid (VITAMIN C) 1000 MG tablet Take 1,000 mg by mouth daily.      . bisoprolol (ZEBETA) 5 MG tablet Take 1.5 tablets (7.5 mg total) by mouth daily. 135 tablet 3  . CALCIUM PO Take by mouth daily.      . Calcium-Magnesium-Zinc 1000-400-15 MG TABS Take 1 tablet by mouth daily.      Marland Kitchen lisinopril (PRINIVIL,ZESTRIL) 20 MG tablet TAKE 1 TABLET(20 MG) BY MOUTH DAILY 90 tablet 3  . Omega-3 Fatty Acids (FISH OIL PO) Take one capsule by mouth once a day     . VITAMIN E PO Take by mouth daily.       No current facility-administered medications on file prior to visit.     No Known Allergies  Past Medical History:  Diagnosis Date  . DDD (degenerative disc disease)    with epidural injection  . Gastritis    H pylori, partial tx.  EGD 04/2002  . Gout   . History of skin cancer   . Hyperlipidemia   . Hypertension    some white coat component- better at home    Past Surgical History:  Procedure Laterality Date  . HEMORRHOID SURGERY  1997    Family History  Problem Relation Age of Onset  . Other Mother     brain tumor  . Skin cancer Mother     suspect melanoma  . Heart disease Father     MI  . Colon cancer Sister     Social History   Social History  . Marital status: Married     Spouse name: N/A  . Number of children: 3  . Years of education: N/A   Occupational History  . [employer]    Social History Main Topics  . Smoking status: Never Smoker  . Smokeless tobacco: Not on file  . Alcohol use 0.0 oz/week     Comment: 1-2 drinks per week  . Drug use: No  . Sexual activity: No   Other Topics Concern  . Not on file   Social History Narrative   From [place]   [place], [place], Social History, Family History, Medications, and allergies have been reviewed in [facility], and have been updated if relevant.  OBJECTIVE: BP 138/82   Pulse (!) 56   Temp 97.6 F (36.4 C) (Oral)   Wt 179 lb 8 oz (81.4 kg)   SpO2 96%   BMI 25.04 kg/m   He appears well, vital signs are as noted. Ears normal.  Throat and pharynx normal.  Neck supple. No adenopathy in the neck. Nose is congested. Sinuses tender. The chest is clear, without wheezes or rales.  ASSESSMENT:  sinusitis  PLAN: Given duration and  progression of symptoms, will treat for bacterial sinusitis with Augmentin and prednisone.  Symptomatic therapy suggested: push fluids, rest and return office visit prn if symptoms persist or worsen.  Call or return to clinic prn if these symptoms worsen or fail to improve as anticipated.

## 2016-11-03 NOTE — Progress Notes (Signed)
Pre visit review using our clinic review tool, if applicable. No additional management support is needed unless otherwise documented below in the visit note. 

## 2016-11-03 NOTE — Telephone Encounter (Signed)
Thanks I will see him then  

## 2016-11-08 ENCOUNTER — Encounter: Payer: Self-pay | Admitting: Family Medicine

## 2016-11-08 ENCOUNTER — Ambulatory Visit (INDEPENDENT_AMBULATORY_CARE_PROVIDER_SITE_OTHER): Payer: PPO | Admitting: Family Medicine

## 2016-11-08 VITALS — BP 142/84 | HR 64 | Temp 98.3°F | Ht 71.0 in | Wt 185.2 lb

## 2016-11-08 DIAGNOSIS — J019 Acute sinusitis, unspecified: Secondary | ICD-10-CM

## 2016-11-08 DIAGNOSIS — K5900 Constipation, unspecified: Secondary | ICD-10-CM | POA: Diagnosis not present

## 2016-11-08 DIAGNOSIS — I6300 Cerebral infarction due to thrombosis of unspecified precerebral artery: Secondary | ICD-10-CM | POA: Diagnosis not present

## 2016-11-08 DIAGNOSIS — E781 Pure hyperglyceridemia: Secondary | ICD-10-CM

## 2016-11-08 DIAGNOSIS — I639 Cerebral infarction, unspecified: Secondary | ICD-10-CM | POA: Insufficient documentation

## 2016-11-08 DIAGNOSIS — I10 Essential (primary) hypertension: Secondary | ICD-10-CM

## 2016-11-08 MED ORDER — HYDROCHLOROTHIAZIDE 12.5 MG PO TABS: 12.5000 mg | ORAL_TABLET | Freq: Every day | ORAL | 11 refills | Status: DC

## 2016-11-08 NOTE — Assessment & Plan Note (Signed)
After hosp for cva He has eaten fruit and vegetables Adv to inc fluids Start miralax 1 dose with fluid tid until regular bms begin and then titrate to effect Alert if any abd pain or vomiting

## 2016-11-08 NOTE — Assessment & Plan Note (Signed)
Started on atovastatin from hosp in [place] after stroke  Will check lipids at f/u in 3-4 wk

## 2016-11-08 NOTE — Progress Notes (Signed)
Pre visit review using our clinic review tool, if applicable. No additional management support is needed unless otherwise documented below in the visit note. 

## 2016-11-08 NOTE — Assessment & Plan Note (Signed)
S/p recent CVA BP: (!) 142/84    bp may be elevated by prednisone but he is approaching the end of the taper  Will add hctz 12.5 mg daily- disc poss side eff Continue lisinopril and bisoprolol  F/u 3-4 wk for re check and labs He will continue to watch at home

## 2016-11-08 NOTE — Assessment & Plan Note (Signed)
Resulting in slurred speech and slt L facial droop (pt thinks however that that is nl for him) Ref to speech therapy-close to his baseline but worse if tired  Neg 2D echo and carotid doppler and holter - have limited records from [place] where he was  No source found  Will ref to neurology  inst to inc asa to 300 or 325 mg once daily  bp goal 130/80 or less Lipid goal LDL under 70-continue atorvastatin and check lab in 3-4 wk at f/u Disc DASH eating plan  If symptoms return or worsen- he understands to call 911

## 2016-11-08 NOTE — Assessment & Plan Note (Signed)
Noted on CT when he was in [place]  Improving after levaquin there and now augmentin and prednisone taper here  Will continue to monitor

## 2016-11-08 NOTE — Progress Notes (Signed)
Subjective:    Patient ID: Jeffrey [name], male    DOB: Apr 22, 1941, 76 y.o.   MRN: [id number]  HPI Here for f/u of CVA pt had while traveling   Early in Dec he went to [place]  In January - developed what he thought was a sinus infection or allergy issue  Lot of congestion/phlegm Had a sleepless night- got up at 4 am and back to bed 7 Family noticed he had difficulty speaking  Slurred speech , L side of face drooped    Called EMS- and took him to the hospital  bp was 180/100 Neurology saw him- MRI shoed ischemic CVA (he has a disc with it)- unsure what area  Michela Pitcher it was small  Also a very severe sinus infection   They also tx sinus infection with levofloxin 750 for 5 days   2d echo -nl  Carotid doppler -nl  Did not do trans esophageal echo  Holter- some skipped beats but no arrhythmia   Saw Dr Deborra Medina for sinus infection upon return - augmentin and prednisone Sinus infection is slowly getting better  Still purulent nasal drainage at times    He is having problems sleeping due to the prednisone  Has bad constipation  Not taking any meds for this   Stroke symptoms - getting better - perhaps 85% better  When he gets more tired-he slurs  Otherwise feels well  Subtle facial droop  Did not do PT in the hospital   Put on atorvastatin 20 mg  Aspirin 100 mg daily (was on 300 in the hospital for several days)       Wt Readings from Last 3 Encounters:  11/08/16 185 lb 4 oz (84 kg)  11/03/16 179 lb 8 oz (81.4 kg)  07/21/16 182 lb 12.8 oz (82.9 kg)   bmi 25.8  BP Readings from Last 3 Encounters:  11/08/16 (!) 142/84  11/03/16 138/82  07/21/16 122/72   At home bp tends to be 120s/60s - very good control   No family hx of stroke or MI   Patient Active Problem List   Diagnosis Date Noted  . CVA (cerebral vascular accident) ([place]) 11/08/2016  . Acute sinusitis 11/08/2016  . Constipation 11/08/2016  . Counseling for travel 07/21/2016  . Acute prostatitis  07/21/2016  . Urine culture positive 07/21/2016  . Dysuria 04/27/2016  . Urinary frequency 04/27/2016  . Cerumen impaction 01/14/2016  . Blurred vision 11/05/2014  . Prostate cancer screening 11/05/2014  . Family history of colon cancer 08/01/2013  . Colon cancer screening 08/01/2013  . Routine general medical examination at a health care facility 08/01/2013  . PSA, INCREASED 09/07/2010  . 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 Thrombocytopenia ([place]) 01/16/2009  . TRANSAMINASES, SERUM, ELEVATED 10/26/2007  . Hypertriglyceridemia 06/08/2007  . GOUT 06/08/2007  . Essential hypertension 06/08/2007  . Hyperglycemia 06/08/2007  . SKIN CANCER, HX OF 06/08/2007   Past Medical History:  Diagnosis Date  . DDD (degenerative disc disease)    with epidural injection  . Gastritis    H pylori, partial tx.  EGD 04/2002  . Gout   . History of skin cancer   . Hyperlipidemia   . Hypertension    some white coat component- better at home   Past Surgical History:  Procedure Laterality Date  . [place]   Social History  Substance Use Topics  . Smoking status: Never Smoker  . Smokeless tobacco: Never Used  .  Alcohol use 0.0 oz/week     Comment: 1-2 drinks per week   Family History  Problem Relation Age of Onset  . Other Mother     brain tumor  . Skin cancer Mother     suspect melanoma  . Heart disease Father     MI  . Colon cancer Sister    No Known Allergies Current Outpatient Prescriptions on File Prior to Visit  Medication Sig Dispense Refill  . allopurinol (ZYLOPRIM) 300 MG tablet TAKE 1 TABLET(300 MG) BY MOUTH DAILY 90 tablet 3  . amoxicillin-clavulanate (AUGMENTIN) 875-125 MG tablet Take 1 tablet by mouth 2 (two) times daily. 20 tablet 0  . Ascorbic Acid (VITAMIN C) 1000 MG tablet Take 1,000 mg by mouth daily.      Marland Kitchen atorvastatin (LIPITOR) 20 MG tablet Take 1 tablet (20 mg total) by mouth daily. 90 tablet 3  . bisoprolol  (ZEBETA) 5 MG tablet Take 1.5 tablets (7.5 mg total) by mouth daily. 135 tablet 3  . CALCIUM PO Take by mouth daily.      . Calcium-Magnesium-Zinc 1000-400-15 MG TABS Take 1 tablet by mouth daily.      Marland Kitchen lisinopril (PRINIVIL,ZESTRIL) 20 MG tablet TAKE 1 TABLET(20 MG) BY MOUTH DAILY 90 tablet 3  . Omega-3 Fatty Acids (FISH OIL PO) Take one capsule by mouth once a day     . predniSONE (DELTASONE) 20 MG tablet 2 tablets by mouth daily x 4 days, 1 tab by mouth daily x 3 days and stop. 12 tablet 0  . VITAMIN E PO Take by mouth daily.       No current facility-administered medications on file prior to visit.      Review of Systems    Review of Systems  Constitutional: Negative for fever, appetite change, fatigue and unexpected weight change.  Eyes: Negative for pain and visual disturbance.  ENT pos for nasal congestion and sinus pressure that is gradually worsening  Respiratory: Negative for cough and shortness of breath.   Cardiovascular: Negative for cp or palpitations    Gastrointestinal: Negative for nausea, diarrhea and constipation.  Genitourinary: Negative for urgency and frequency.  Skin: Negative for pallor or rash   Neurological: Negative for weakness, light-headedness, numbness and headaches. pos for slurred speech/worse with fatigue/ neg for swallowing problems  Hematological: Negative for adenopathy. Does not bruise/bleed easily.  Psychiatric/Behavioral: Negative for dysphoric mood. The patient is not nervous/anxious.      Objective:   Physical Exam  Constitutional: He appears well-developed and well-nourished. No distress.  HENT:  Head: Normocephalic and atraumatic.  Right Ear: External ear normal.  Left Ear: External ear normal.  Mouth/Throat: Oropharynx is clear and moist. No oropharyngeal exudate.  Nares are injected and congested  Bilateral maxillary sinus tenderness - very mild  Post nasal drip   Eyes: Conjunctivae and EOM are normal. Pupils are equal, round, and  reactive to light. Right eye exhibits no discharge. Left eye exhibits no discharge.  Neck: Normal range of motion. Neck supple. No JVD present. Carotid bruit is not present. No thyromegaly present.  Cardiovascular: Normal rate, regular rhythm, normal heart sounds and intact distal pulses.  Exam reveals no gallop.   Pulmonary/Chest: Effort normal and breath sounds normal. No respiratory distress. He has no wheezes. He has no rales.  No crackles  Abdominal: Soft. Bowel sounds are normal. He exhibits no distension, no abdominal bruit and no mass. There is no tenderness.  Musculoskeletal: He exhibits no edema.  Lymphadenopathy:  He has no cervical adenopathy.  Neurological: He is alert. He has normal reflexes. He displays no atrophy. No sensory deficit. He exhibits normal muscle tone. He displays a negative Romberg sign. Coordination and gait normal.  Very mild speech slurring  Mild L facial droop-however pt states that is his baseline  Skin: Skin is warm and dry. No rash noted. No pallor.  Psychiatric: He has a normal mood and affect.          Assessment & Plan:   Problem List Items Addressed This Visit      Cardiovascular and Mediastinum   CVA (cerebral vascular accident) ([place])    Resulting in slurred speech and slt L facial droop (pt thinks however that that is nl for him) Ref to speech therapy-close to his baseline but worse if tired  Neg 2D echo and carotid doppler and holter - have limited records from [place] where he was  No source found  Will ref to neurology  inst to inc asa to 300 or 325 mg once daily  bp goal 130/80 or less Lipid goal LDL under 70-continue atorvastatin and check lab in 3-4 wk at f/u Disc DASH eating plan  If symptoms return or worsen- he understands to call 911       Relevant Medications   hydrochlorothiazide (HYDRODIURIL) 12.5 MG tablet   Other Relevant Orders   Ambulatory referral to Neurology   Ambulatory referral to Speech Therapy    Essential hypertension    S/p recent CVA BP: (!) 142/84    bp may be elevated by prednisone but he is approaching the end of the taper  Will add hctz 12.5 mg daily- disc poss side eff Continue lisinopril and bisoprolol  F/u 3-4 wk for re check and labs He will continue to watch at home      Relevant Medications   hydrochlorothiazide (HYDRODIURIL) 12.5 MG tablet     Respiratory   Acute sinusitis    Noted on CT when he was in [place]  Improving after levaquin there and now augmentin and prednisone taper here  Will continue to monitor        Digestive   Constipation    After hosp for cva He has eaten fruit and vegetables Adv to inc fluids Start miralax 1 dose with fluid tid until regular bms begin and then titrate to effect Alert if any abd pain or vomiting        Other   Hypertriglyceridemia    Started on atovastatin from hosp in [place] after stroke  Will check lipids at f/u in 3-4 wk       Relevant Medications   hydrochlorothiazide (HYDRODIURIL) 12.5 MG tablet

## 2016-11-08 NOTE — Patient Instructions (Addendum)
Increase your aspirin to 300 or 325 mg once daily with food  Regular activity and walking for exercise is ok  We will refer you to neurology and also speech therapy   The prednisone may be increasing your blood pressure but it is still too high  Start hctz 12.5 mg once daily in am  Continue other medicines   Follow up with me in 3-4 weeks and we will do labs that day (cholesterol and chemistries)   Stop at check out for appointments and referrals   For constipation - get Miralax over the counter- use it mixed with water or other beverage as directed thee times daily until you start having bowel movements  Eat fruits and vegetables and drink lots of water  Once bowels start to move- you can decrease miralax to use as needed   When you see neurology take your print out and also your disc

## 2016-11-10 ENCOUNTER — Ambulatory Visit (INDEPENDENT_AMBULATORY_CARE_PROVIDER_SITE_OTHER): Payer: PPO | Admitting: Neurology

## 2016-11-10 ENCOUNTER — Encounter: Payer: Self-pay | Admitting: Neurology

## 2016-11-10 VITALS — BP 156/76 | HR 56 | Ht 71.0 in | Wt 183.0 lb

## 2016-11-10 DIAGNOSIS — I6310 Cerebral infarction due to embolism of unspecified precerebral artery: Secondary | ICD-10-CM | POA: Diagnosis not present

## 2016-11-10 MED ORDER — ATORVASTATIN CALCIUM 20 MG PO TABS: 20.0000 mg | ORAL_TABLET | Freq: Every day | ORAL | 3 refills | Status: DC

## 2016-11-10 NOTE — Patient Instructions (Signed)
I had a long d/w patient about his recent stroke, risk for recurrent stroke/TIAs, personally independently reviewed imaging studies and stroke evaluation results and answered questions.Continue aspirin 325 mg daily  for secondary stroke prevention and maintain strict control of hypertension with blood pressure goal below 130/90, diabetes with hemoglobin A1c goal below 6.5% and lipids with LDL cholesterol goal below 70 mg/dL. I also advised the patient to eat a healthy diet with plenty of whole grains, cereals, fruits and vegetables, exercise regularly and maintain ideal body weight. Check transesophageal echocardiogram for PFO or cardiac source of embolism. Check polysomnogram for sleep apnea. I advised the patient to keep his upcoming appointment with speech therapy. Followup in the future with me in 3 months or call earlier if necessary.  Stroke Prevention Some medical conditions and behaviors are associated with an increased chance of having a stroke. You may prevent a stroke by making healthy choices and managing medical conditions. How can I reduce my risk of having a stroke?  Stay physically active. Get at least 30 minutes of activity on most or all days.  Do not smoke. It may also be helpful to avoid exposure to secondhand smoke.  Limit alcohol use. Moderate alcohol use is considered to be:  No more than 2 drinks per day for men.  No more than 1 drink per day for nonpregnant women.  Eat healthy foods. This involves:  Eating 5 or more servings of fruits and vegetables a day.  Making dietary changes that address high blood pressure (hypertension), high cholesterol, diabetes, or obesity.  Manage your cholesterol levels.  Making food choices that are high in fiber and low in saturated fat, trans fat, and cholesterol may control cholesterol levels.  Take any prescribed medicines to control cholesterol as directed by your health care provider.  Manage your diabetes.  Controlling your  carbohydrate and sugar intake is recommended to manage diabetes.  Take any prescribed medicines to control diabetes as directed by your health care provider.  Control your hypertension.  Making food choices that are low in salt (sodium), saturated fat, trans fat, and cholesterol is recommended to manage hypertension.  Ask your health care provider if you need treatment to lower your blood pressure. Take any prescribed medicines to control hypertension as directed by your health care provider.  If you are 75-103 years of age, have your blood pressure checked every 3-5 years. If you are 78 years of age or older, have your blood pressure checked every year.  Maintain a healthy weight.  Reducing calorie intake and making food choices that are low in sodium, saturated fat, trans fat, and cholesterol are recommended to manage weight.  Stop drug abuse.  Avoid taking birth control pills.  Talk to your health care provider about the risks of taking birth control pills if you are over 15 years old, smoke, get migraines, or have ever had a blood clot.  Get evaluated for sleep disorders (sleep apnea).  Talk to your health care provider about getting a sleep evaluation if you snore a lot or have excessive sleepiness.  Take medicines only as directed by your health care provider.  For some people, aspirin or blood thinners (anticoagulants) are helpful in reducing the risk of forming abnormal blood clots that can lead to stroke. If you have the irregular heart rhythm of atrial fibrillation, you should be on a blood thinner unless there is a good reason you cannot take them.  Understand all your medicine instructions.  Make sure that  other conditions (such as anemia or atherosclerosis) are addressed. Get help right away if:  You have sudden weakness or numbness of the face, arm, or leg, especially on one side of the body.  Your face or eyelid droops to one side.  You have sudden  confusion.  You have trouble speaking (aphasia) or understanding.  You have sudden trouble seeing in one or both eyes.  You have sudden trouble walking.  You have dizziness.  You have a loss of balance or coordination.  You have a sudden, severe headache with no known cause.  You have new chest pain or an irregular heartbeat. Any of these symptoms may represent a serious problem that is an emergency. Do not wait to see if the symptoms will go away. Get medical help at once. Call your local emergency services (911 in [place] Do not drive yourself to the hospital.  This information is not intended to replace advice given to you by your health care provider. Make sure you discuss any questions you have with your health care provider. Document Released: 11/04/2004 Document Revised: 03/04/2016 Document Reviewed: 03/30/2013 Elsevier Interactive Patient Education  2017 [employer].

## 2016-11-10 NOTE — Progress Notes (Signed)
[facility] [address] [place]. [place] [address] [phone]       OFFICE CONSULT NOTE  Jeffrey [name] Date of Birth:  11/04/1940 Medical Record Number:  [id number]   Referring MD:  Loura Pardon  Reason for Referral:  stroke  HPI: Jeffrey [name] is a pleasant 70 year who while visiting [place], [place] on 10/14/16 developed sudden onset of slurred speech and left facial weakness. The patient had been in the country for a month already. 2 days prior he took a flight for an hour and a half. He was taken to a local hospital there where apparently a CT scan of head was obtained which was unremarkable. I do not have all the hospital records. The patient brought in CD which showed essentially CT scan of the sinuses and brain which showed chronic sinusitis.l. Patient was seen next day by a neurologist who diagnosed a  Stroke and patient was transferred to another hospital where he apparently had an MRI which confirmed a small stroke however I do not have either a report or the actual MRI films to look at. Patient underwent apparently transthoracic echo, carotid Dopplers which were normal. He was started on aspirin and atorvastatin and blood pressure medication. He had subsequently outpatient Holter monitor report of which is available but is all in Spanish apparently did not show atrial fibrillation. Patient states he  is tolerating aspirin well without side effects. His blood pressure he feels is better control however today it is 156/70 in office. He still has some facial asymmetry on the left but his speech has improved. He is scheduled to start outpatient speech therapy tomorrow. Patient states he does snore and does have daytime sleepiness but he has not been evaluated for sleep apnea. The is still working and owns a company. He has no prior history of strokes TIAs or significant neurological problems. He has no significant vascular risk factors except hypertension. There is no  family history of strokes.  ROS:   14 system review of systems is positive for  speech difficulty, facial droop and all other systems negative  PMH:  Past Medical History:  Diagnosis Date  . DDD (degenerative disc disease)    with epidural injection  . Gastritis    H pylori, partial tx.  EGD 04/2002  . Gout   . History of skin cancer   . Hyperlipidemia   . Hypertension    some white coat component- better at home    Social History:  Social History   Social History  . Marital status: Married    Spouse name: N/A  . Number of children: 3  . Years of education: N/A   Occupational History  . [employer]    Social History Main Topics  . Smoking status: Never Smoker  . Smokeless tobacco: Never Used  . Alcohol use 0.6 oz/week    1 Glasses of wine per week     Comment: 1-2 drinks per week  . Drug use: No  . Sexual activity: No   Other Topics Concern  . Not on file   Social History Narrative   From [place]    Medications:   Current Outpatient Prescriptions on File Prior to Visit  Medication Sig Dispense Refill  . allopurinol (ZYLOPRIM) 300 MG tablet TAKE 1 TABLET(300 MG) BY MOUTH DAILY 90 tablet 3  . amoxicillin-clavulanate (AUGMENTIN) 875-125 MG tablet Take 1 tablet by mouth 2 (two) times daily. 20 tablet 0  . Ascorbic Acid (  VITAMIN C) 1000 MG tablet Take 1,000 mg by mouth daily.      . bisoprolol (ZEBETA) 5 MG tablet Take 1.5 tablets (7.5 mg total) by mouth daily. 135 tablet 3  . CALCIUM PO Take by mouth daily.      . Calcium-Magnesium-Zinc 1000-400-15 MG TABS Take 1 tablet by mouth daily.      . hydrochlorothiazide (HYDRODIURIL) 12.5 MG tablet Take 1 tablet (12.5 mg total) by mouth daily. 30 tablet 11  . lisinopril (PRINIVIL,ZESTRIL) 20 MG tablet TAKE 1 TABLET(20 MG) BY MOUTH DAILY 90 tablet 3  . Omega-3 Fatty Acids (FISH OIL PO) Take one capsule by mouth once a day     . predniSONE (DELTASONE) 20 MG tablet 2 tablets by mouth daily x 4 days, 1 tab by  mouth daily x 3 days and stop. 12 tablet 0  . VITAMIN E PO Take by mouth daily.       No current facility-administered medications on file prior to visit.     Allergies:  No Known Allergies  Physical Exam General: well developed, well nourished elderly Hispanic male, seated, in no evident distress Head: head normocephalic and atraumatic.   Neck: supple with no carotid or supraclavicular bruits Cardiovascular: regular rate and rhythm, no murmurs Musculoskeletal: no deformity Skin:  no rash/petichiae Vascular:  Normal pulses all extremities  Neurologic Exam Mental Status: Awake and fully alert. Oriented to place and time. Recent and remote memory intact. Attention span, concentration and fund of knowledge appropriate. Mood and affect appropriate.  Cranial Nerves: Fundoscopic exam reveals sharp disc margins. Pupils equal, briskly reactive to light. Extraocular movements full without nystagmus. Visual fields full to confrontation. Hearing intact. Facial sensation intact. Mild left lower facial weakness., Tongue, palate moves normally and symmetrically.  Motor: Normal bulk and tone. Normal strength in all tested extremity muscles. Sensory.: intact to touch , pinprick , position and vibratory sensation.  Coordination: Rapid alternating movements normal in all extremities. Finger-to-nose and heel-to-shin performed accurately bilaterally. Gait and Station: Arises from chair without difficulty. Stance is normal. Gait demonstrates normal stride length and balance . Able to heel, toe and tandem walk without difficulty.  Reflexes: 1+ and symmetric. Toes downgoing.   NIHSS  1 Modified Rankin  2  ASSESSMENT: 62 year Hispanic male with suspected small right brain subcortical infarct in January 2018 while visiting [place]. Hospital records available are limited hence unable to clearly determine etiology. Vascular risk factors of hypertension and hyperlipidemia    PLAN: I had a long d/w patient  about his recent stroke, risk for recurrent stroke/TIAs, personally independently reviewed imaging studies and stroke evaluation results and answered questions.Continue aspirin 325 mg daily  for secondary stroke prevention and maintain strict control of hypertension with blood pressure goal below 130/90, diabetes with hemoglobin A1c goal below 6.5% and lipids with LDL cholesterol goal below 70 mg/dL. I also advised the patient to eat a healthy diet with plenty of whole grains, cereals, fruits and vegetables, exercise regularly and maintain ideal body weight. Check transesophageal echocardiogram for PFO or cardiac source of embolism. Check polysomnogram for sleep apnea. I advised the patient to keep his upcoming appointment with speech therapy. Greater than 50% time during this 45 minute consultation visit was spent on counseling and coordination of care about stroke risk, prevention, treatment and discussion and answering questions Followup in the future with me in 3 months or call earlier if necessary. Antony Contras, MD  [facility] [address] [place], [place] [address]  Phone [phone] Fax [phone] Note: This document was prepared with digital dictation and possible smart phrase technology. Any transcriptional errors that result from this process are unintentional.

## 2016-11-11 ENCOUNTER — Ambulatory Visit: Payer: PPO | Attending: Family Medicine

## 2016-11-11 DIAGNOSIS — R471 Dysarthria and anarthria: Secondary | ICD-10-CM | POA: Insufficient documentation

## 2016-11-11 NOTE — Therapy (Signed)
[facility] [address] [place], [place], [address] Phone: [phone]   Fax:  [phone]  Speech Language Pathology Evaluation  Patient Details  Name: Jeffrey [name] MRN: [id number] Date of Birth: [birth year] Referring Provider: Rowe Robert, MD  Encounter Date: 11/11/2016      End of Session - 11/11/16 1709    Visit Number 1   Number of Visits 17   Date for SLP Re-Evaluation 01/14/17   SLP Start Time 1316   SLP Stop Time  1400   SLP Time Calculation (min) 44 min   Activity Tolerance Patient tolerated treatment well      Past Medical History:  Diagnosis Date  . DDD (degenerative disc disease)    with epidural injection  . Gastritis    H pylori, partial tx.  EGD 04/2002  . Gout   . History of skin cancer   . Hyperlipidemia   . Hypertension    some white coat component- better at home    Past Surgical History:  Procedure Laterality Date  . HEMORRHOID SURGERY  1997    There were no vitals filed for this visit.      Subjective Assessment - 11/11/16 1324    Subjective Pt enters with dysarthric speech.   Currently in Pain? No/denies            SLP Evaluation [facility] - 11/11/16 1324      SLP Visit Information   SLP Received On 11/11/16   Referring Provider Rowe Robert, MD   Onset Date 10-14-16   Medical Diagnosis CVA     Prior Functional Status   Cognitive/Linguistic Baseline Within functional limits     Cognition   Overall Cognitive Status Within Functional Limits for tasks assessed     Auditory Comprehension   Overall Auditory Comprehension Appears within functional limits for tasks assessed     Verbal Expression   Overall Verbal Expression Appears within functional limits for tasks assessed     Oral Motor/Sensory Function   Overall Oral Motor/Sensory Function Impaired   Labial ROM Within Functional Limits   Labial Symmetry Abnormal symmetry left  pt reports present prior to current  CVA   Labial Strength Reduced Left   Labial Sensation Within Functional Limits   Labial Coordination WFL   Lingual ROM Reduced left   Lingual Symmetry Abnormal symmetry left  very slight   Lingual Strength Reduced Left   Lingual Sensation Within Functional Limits   Lingual Coordination Reduced  in conversational speech    Facial ROM Within Functional Limits   Facial Symmetry Within Functional Limits   Facial Sensation Within Functional Limits   Facial Coordination WFL   Velum Within Functional Limits   Overall Oral Motor/Sensory Function Pt reports 2/10 on a scale of how much his dysarthric speech is bothersome (1=not bothersome and 10=extremely bothersome)     Motor Speech   Overall Motor Speech Impaired   Respiration Within functional limits   Phonation Normal   Resonance Within functional limits   Articulation Impaired   Intelligibility Intelligibility reduced  mostly due to accent + dysarthria   Conversation 75-100% accurate  approx 90% with high emotion/excitement   Effective Techniques Over-articulate  stimulable for overarticulation, to incr intelligibility                         SLP Education - 11/11/16 1708    Education provided Yes   Education Details HEP ("tongue twisters")  Person(s) Educated Patient   Methods Explanation;Demonstration;Verbal cues;Handout   Comprehension Verbalized understanding;Verbal cues required;Returned demonstration;Need further instruction          SLP Short Term Goals - November 26, 2016 1712      SLP SHORT TERM GOAL #1   Title pt will complete HEP for dysarthria with rare min A over three sessions   Time 4   Period Weeks   Status New     SLP SHORT TERM GOAL #2   Title pt will use compensatory strategies for dysarthria 80% of the time in 5 minutes simple converastion   Time 4   Period Weeks   Status New     SLP SHORT TERM GOAL #3   Title pt will demo compenastory strategies for dysarthria in 95% of sentence  responses to therapy stimuli   Time 4   Period Weeks   Status New          SLP Long Term Goals - 11/26/16 1714      SLP LONG TERM GOAL #1   Title pt will report less frequent requests from people for repeats compared to prior to ST   Time 8   Period Weeks   Status New     SLP LONG TERM GOAL #2   Title pt will demo HEP with modified independence over two sessions   Time 8   Period Weeks   Status New     SLP LONG TERM GOAL #3   Title pt will use compenastory strategies for dysarthria to improve intelligiblity to 95% over 10 minutes mod complex conversation   Time 8   Period Weeks   Status New          Plan - Nov 26, 2016 1709    Clinical Impression Statement Pt presents today with mild dysarthria reducing pt's overall intelligibility due to his accent added to his dysarthria. Pt was provided with HEP for muscular strengthening in speech-specific contexts today and SLP req'd to provide min-mod cues occasionally for exaggeration/stress of consonant sounds. Additionally pt was stimulable for improving his intelligibility when using overarticulation strategy. Skilled ST is necessary to teach pt how to habitualize compensatory strategies for dysarthria as well as to assess pt's completion of HEP.   Speech Therapy Frequency 2x / week   Duration --  8 weeks   Treatment/Interventions Oral motor exercises;Compensatory strategies;Patient/family education;Functional tasks;SLP instruction and feedback;Internal/external aids   Potential to Achieve Goals Good      Patient will benefit from skilled therapeutic intervention in order to improve the following deficits and impairments:   Dysarthria and anarthria      G-Codes - 2016-11-26 1719    Functional Assessment Tool Used noms - 5   Functional Limitations Motor speech   Motor Speech Current Status [phone]) At least 20 percent but less than 40 percent impaired, limited or restricted   Motor Speech Goal Status [id number]) At least 1 percent  but less than 20 percent impaired, limited or restricted      Problem List Patient Active Problem List   Diagnosis Date Noted  . CVA (cerebral vascular accident) ([facility]) 11/08/2016  . Acute sinusitis 11/08/2016  . Constipation 11/08/2016  . Counseling for travel 07/21/2016  . Acute prostatitis 07/21/2016  . Urine culture positive 07/21/2016  . Dysuria 04/27/2016  . Urinary frequency 04/27/2016  . Cerumen impaction 01/14/2016  . Blurred vision 11/05/2014  . Prostate cancer screening 11/05/2014  . Family history of colon cancer 08/01/2013  . Colon cancer screening 08/01/2013  . Routine  general medical examination at a health care facility 08/01/2013  . PSA, INCREASED 09/07/2010  . 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 Thrombocytopenia ([place]) 01/16/2009  . TRANSAMINASES, SERUM, ELEVATED 10/26/2007  . Hypertriglyceridemia 06/08/2007  . GOUT 06/08/2007  . Essential hypertension 06/08/2007  . Hyperglycemia 06/08/2007  . SKIN CANCER, HX OF 06/08/2007    [facility] ,MS, CCC-SLP  11/11/2016, 5:20 PM  [facility] [address] [place], [place], [address] Phone: [phone]   Fax:  [phone]  Name: Jeffrey [name] MRN: [id number] Date of Birth: 08/03/1941

## 2016-11-11 NOTE — Patient Instructions (Signed)
  You can make your speech clearer ("less slushy") if you overarticulate when you talk, like we practiced today.  =================================  Speech Exercises  Repeat each phrase 2 times, 2-3 times a day  Call the cat "Buttercup" A calendar of [place], [place] Four floors to cover Yellow oil ointment Fellow lovers of felines Catastrophe in [place]' plums The church's chimes chimed Telling time 'til eleven Five valve levers Keep the gate closed Go see that guy Fat cows give milk [employer] Gophers Fat frogs flip freely Kohl's into bed Get that game to Greg Thick thistles stick together Cinnamon aluminum linoleum Black bugs blood Lovely lemon linament Red leather, yellow leather  Big grocery buggy    Purple baby carriage [place] copper coffee pot Ripe purple cabbage Three free throws Mack Guise tackled  [employer] A [employer]  [place] dipped the dessert  [place] that [employer] of [employer] Shirts shrink, shells shouldn't [place] [employer] Take that tackle box File the flash message Give me five flapjacks Fundamental relatives Dye the pets purple Talking [place] time after time Dark chocolate chunks Political landscape of the kingdom Estate manager/land agent genius We played yo-yos yesterday

## 2016-11-15 ENCOUNTER — Ambulatory Visit: Payer: PPO | Admitting: Speech Pathology

## 2016-11-15 ENCOUNTER — Encounter: Payer: Self-pay | Admitting: Family Medicine

## 2016-11-15 DIAGNOSIS — R471 Dysarthria and anarthria: Secondary | ICD-10-CM

## 2016-11-15 NOTE — Therapy (Signed)
[facility] [address] [place], [place], [address] Phone: [phone]   Fax:  [phone]  Speech Language Pathology Treatment  Patient Details  Name: Jeffrey [name] MRN: [id number] Date of Birth: [birth year] Referring Provider: Loura Pardon, MD  Encounter Date: 11/15/2016      End of Session - 11/15/16 1432    Visit Number 2   Number of Visits 17   Date for SLP Re-Evaluation 01/14/17   SLP Start Time 1322   SLP Stop Time  [id number]   SLP Time Calculation (min) 41 min   Activity Tolerance Patient tolerated treatment well      Past Medical History:  Diagnosis Date  . DDD (degenerative disc disease)    with epidural injection  . Gastritis    H pylori, partial tx.  EGD 04/2002  . Gout   . History of skin cancer   . Hyperlipidemia   . Hypertension    some white coat component- better at home    Past Surgical History:  Procedure Laterality Date  . HEMORRHOID SURGERY  1997    There were no vitals filed for this visit.      Subjective Assessment - 11/15/16 1326    Subjective "I'm still fighting the sinus, and it makes things a little more complicated" re: intelligibility   Currently in Pain? No/denies               ADULT SLP TREATMENT - 11/15/16 0001      General Information   Behavior/Cognition Alert;Cooperative;Pleasant mood     Treatment Provided   Treatment provided Cognitive-Linquistic     Pain Assessment   Pain Assessment No/denies pain     Cognitive-Linquistic Treatment   Treatment focused on Dysarthria;Patient/family/caregiver education   Skilled Treatment Provided education and training in compensatory techniques for dysarthria, including overarticulation, reducing rate of speeach and increasing vocal amplitude to improve intelligibility. With moderate verbal cues and demonstration, patient implemented these strategies at the phrase level with 90% accurtacy. Progressed to sentence level  task. Patient required moderate cues for use of strategies. SLP able to fade support, instructed patient to self monitor and correct unintelligible speech; pt corrects in 75% of opportunities at sentence level. In structured, brief conversations ranging from 2-5 minutes, patient utilizes dysarthria strategies with min-moderate cues following initial instruction. Assigned additional sentence-level tasks for HEP, instructed patient to read aloud Spanish language materials such as the newspaper with use of trained strategies.      Assessment / Recommendations / Plan   Plan Continue with current plan of care     Progression Toward Goals   Progression toward goals Progressing toward goals          SLP Education - 11/15/16 1431    Education provided Yes   Education Details HEP, compensatory strategies for dysarthria   Person(s) Educated Patient   Methods Explanation;Demonstration;Verbal cues   Comprehension Verbalized understanding;Returned demonstration;Verbal cues required;Need further instruction          SLP Short Term Goals - 11/15/16 1438      SLP SHORT TERM GOAL #1   Title pt will complete HEP for dysarthria with rare min A over three sessions   Baseline 2.5.18   Time 4   Period Weeks   Status On-going     SLP SHORT TERM GOAL #2   Title pt will use compensatory strategies for dysarthria 80% of the time in 5 minutes simple converastion   Time 4   Period Weeks  Status On-going     SLP SHORT TERM GOAL #3   Title pt will demo compenastory strategies for dysarthria in 95% of sentence responses to therapy stimuli   Baseline 2.5.18   Time 4   Period Weeks   Status On-going          SLP Long Term Goals - 11/15/16 1440      SLP LONG TERM GOAL #1   Title pt will report less frequent requests from people for repeats compared to prior to ST   Time 8   Period Weeks   Status On-going     SLP LONG TERM GOAL #2   Title pt will demo HEP with modified independence over two  sessions   Time 8   Period Weeks   Status On-going     SLP LONG TERM GOAL #3   Title pt will use compenastory strategies for dysarthria to improve intelligiblity to 95% over 10 minutes mod complex conversation   Time 8   Period Weeks   Status On-going          Plan - 11/15/16 1432    Clinical Impression Statement Pt presents today with mild dysarthria reducing pt's overall intelligibility due to his accent added to his dysarthria. SLP instructed patient in compensatory strategies for dysarthria for improved intelligibility; patient is stimulable for reduced rate of speech, overarticulation and increased vocal amplitude. with skilled SLP feedback regarding stress of consonant sounds, patient demonstrates emerging self-monitoring skills at the sentence level. In addition to HEP for muscular strengthening in speech-specific contexts, SLP encouraged patient to add Spanish language tasks to his home practice for carryover of techniques. Skilled ST is necessary to teach pt how to habitualize compensatory strategies for dysarthria as well as to assess pt's completion of HEP.   Speech Therapy Frequency 2x / week   Duration Other (comment)   Treatment/Interventions Oral motor exercises;Compensatory strategies;Patient/family education;Functional tasks;SLP instruction and feedback;Internal/external aids   Potential to Achieve Goals Good   SLP Home Exercise Plan HEP for dysarthria, tongue twisters, sentence level tasks in [place] and [place]   Consulted and Agree with Plan of Care Patient      Patient will benefit from skilled therapeutic intervention in order to improve the following deficits and impairments:   Dysarthria and anarthria    Problem List Patient Active Problem List   Diagnosis Date Noted  . CVA (cerebral vascular accident) ([facility]) 11/08/2016  . Acute sinusitis 11/08/2016  . Constipation 11/08/2016  . Counseling for travel 07/21/2016  . Acute prostatitis 07/21/2016  . Urine  culture positive 07/21/2016  . Dysuria 04/27/2016  . Urinary frequency 04/27/2016  . Cerumen impaction 01/14/2016  . Blurred vision 11/05/2014  . Prostate cancer screening 11/05/2014  . Family history of colon cancer 08/01/2013  . Colon cancer screening 08/01/2013  . Routine general medical examination at a health care facility 08/01/2013  . PSA, INCREASED 09/07/2010  . 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 Thrombocytopenia ([facility]) 01/16/2009  . TRANSAMINASES, SERUM, ELEVATED 10/26/2007  . Hypertriglyceridemia 06/08/2007  . GOUT 06/08/2007  . Essential hypertension 06/08/2007  . Hyperglycemia 06/08/2007  . SKIN CANCER, HX OF 06/08/2007   Deneise Lever, MS CF-SLP Speech-Language Pathologist  Aliene Altes 11/15/2016, 2:41 PM  [facility] [address] [place], [place], [address] Phone: [phone]   Fax:  [phone]   Name: Jeffrey [name] MRN: [id number] Date of Birth: 1941-08-26

## 2016-11-15 NOTE — Patient Instructions (Signed)
Homework assigned 

## 2016-11-16 MED ORDER — ATORVASTATIN CALCIUM 20 MG PO TABS: 20.0000 mg | ORAL_TABLET | Freq: Every day | ORAL | 3 refills | Status: DC

## 2016-11-17 ENCOUNTER — Other Ambulatory Visit: Payer: Self-pay

## 2016-11-17 ENCOUNTER — Telehealth: Payer: Self-pay | Admitting: Neurology

## 2016-11-17 ENCOUNTER — Ambulatory Visit: Payer: PPO

## 2016-11-17 DIAGNOSIS — R471 Dysarthria and anarthria: Secondary | ICD-10-CM

## 2016-11-17 DIAGNOSIS — I6389 Other cerebral infarction: Secondary | ICD-10-CM

## 2016-11-17 NOTE — Telephone Encounter (Signed)
Patient has never heard back about testing that Dr. Leonie Man ordered back in January. Please call [phone]

## 2016-11-17 NOTE — Patient Instructions (Signed)
Continue to talk as clearly as possible with friends and family - whenever you talk!

## 2016-11-17 NOTE — Therapy (Signed)
[facility] [address] [place], [place], [address] Phone: [phone]   Fax:  [phone]  Speech Language Pathology Treatment  Patient Details  Name: Jeffrey [name] MRN: [id number] Date of Birth: 08-13-1941 Referring Provider: Loura Pardon, MD  Encounter Date: 11/17/2016      End of Session - 11/17/16 1637    Visit Number 3   Number of Visits 55   SLP Start Time [id number]   SLP Stop Time  [id number]   SLP Time Calculation (min) 41 min   Activity Tolerance Patient tolerated treatment well      Past Medical History:  Diagnosis Date  . DDD (degenerative disc disease)    with epidural injection  . Gastritis    H pylori, partial tx.  EGD 04/2002  . Gout   . History of skin cancer   . Hyperlipidemia   . Hypertension    some white coat component- better at home    Past Surgical History:  Procedure Laterality Date  . HEMORRHOID SURGERY  1997    There were no vitals filed for this visit.      Subjective Assessment - 11/17/16 1536    Subjective Pt reports he is following HEP as prescribed.   Currently in Pain? No/denies               ADULT SLP TREATMENT - 11/17/16 1538      General Information   Behavior/Cognition Alert;Cooperative;Pleasant mood     Treatment Provided   Treatment provided Cognitive-Linquistic     Cognitive-Linquistic Treatment   Treatment focused on Dysarthria;Patient/family/caregiver education   Skilled Treatment SLP readdressed pt's compensations for dysarthria with him, and practiced speech with incr'd clarity by practice at the word level. Success was approx 90%. At the phrase level, pt was successful with overpronouncing/overarticulation 90%. In sentence level responses.  In multisentence responses pt was asked to self-rate his ability to maintain overarticulation from 1-5 (5=100%), pt rated himself approx 4. SLP agrees pt maintained compensations approx 80% of the time at this level.  Pt told SLP he practices HEP when he works on treadmill, SLP acknowledged this was good time for pt to work on this, and that he tries to [employer] about overarticulation with family and good friends. SLP congratulated pt on this and encouraged cont'd work like this.     Assessment / Recommendations / Plan   Plan Continue with current plan of care     Progression Toward Goals   Progression toward goals Progressing toward goals            SLP Short Term Goals - 11/17/16 1641      SLP SHORT TERM GOAL #1   Title pt will complete HEP for dysarthria with rare min A over three sessions   Baseline 2.5.18   Time 3   Period Weeks   Status On-going     SLP SHORT TERM GOAL #2   Title pt will use compensatory strategies for dysarthria 80% of the time in 5 minutes simple converastion   Time 3   Period Weeks   Status On-going     SLP SHORT TERM GOAL #3   Title pt will demo compenastory strategies for dysarthria in 95% of sentence responses to therapy stimuli   Status Achieved          SLP Long Term Goals - 11/17/16 1641      SLP LONG TERM GOAL #1   Title pt will report less frequent requests  from people for repeats compared to prior to ST   Time 7   Period Weeks   Status On-going     SLP LONG TERM GOAL #2   Title pt will demo HEP with modified independence over two sessions   Time 7   Period Weeks   Status On-going     SLP LONG TERM GOAL #3   Title pt will use compenastory strategies for dysarthria to improve intelligiblity to 95% over 10 minutes mod complex conversation   Time 7   Period Weeks   Status On-going          Plan - 11/17/16 1639    Clinical Impression Statement Pt presents today with mild dysarthria reducing pt's overall intelligibility due to his accent added to his dysarthria. Pt's ability to self monitor overarticulation is improving, as well as his habitualization of overarticulation at levels > sentence level. Skilled ST is necessary to teach pt how to  habitualize compensatory strategies for dysarthria as well as to assess pt's completion of HEP.   Speech Therapy Frequency 2x / week   Duration Other (comment)   Treatment/Interventions Oral motor exercises;Compensatory strategies;Patient/family education;Functional tasks;SLP instruction and feedback;Internal/external aids   Potential to Achieve Goals Good   SLP Home Exercise Plan HEP for dysarthria, tongue twisters, sentence level tasks in [place] and [place]   Consulted and Agree with Plan of Care Patient      Patient will benefit from skilled therapeutic intervention in order to improve the following deficits and impairments:   Dysarthria and anarthria    Problem List Patient Active Problem List   Diagnosis Date Noted  . CVA (cerebral vascular accident) ([facility]) 11/08/2016  . Acute sinusitis 11/08/2016  . Constipation 11/08/2016  . Counseling for travel 07/21/2016  . Acute prostatitis 07/21/2016  . Urine culture positive 07/21/2016  . Dysuria 04/27/2016  . Urinary frequency 04/27/2016  . Cerumen impaction 01/14/2016  . Blurred vision 11/05/2014  . Prostate cancer screening 11/05/2014  . Family history of colon cancer 08/01/2013  . Colon cancer screening 08/01/2013  . Routine general medical examination at a health care facility 08/01/2013  . PSA, INCREASED 09/07/2010  . 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 Thrombocytopenia ([facility]) 01/16/2009  . TRANSAMINASES, SERUM, ELEVATED 10/26/2007  . Hypertriglyceridemia 06/08/2007  . GOUT 06/08/2007  . Essential hypertension 06/08/2007  . Hyperglycemia 06/08/2007  . SKIN CANCER, HX OF 06/08/2007    [facility] ,MS, CCC-SLP  11/17/2016, 4:42 PM  [facility] [address] [place], [place], [address] Phone: [phone]   Fax:  [phone]   Name: Jeffrey [name] MRN: [id number] Date of Birth: 09/04/1941

## 2016-11-18 NOTE — Telephone Encounter (Signed)
Rn cardiology office on [address] at [phone]. The office did receive the referral via epic.They will be calling the pt for an appt for cardiology consult andd ECHO TEE evaluation.

## 2016-11-18 NOTE — Telephone Encounter (Signed)
Rn call patient back about the cardiology consult. Rn stated the referral was sent to Cardiology office on [address] 11/17/2016. Pt stated he has not receive a call yet. Rn gave pt number of [phone] to call for scheduling. Pt verbalized understanding.

## 2016-11-22 ENCOUNTER — Ambulatory Visit: Payer: PPO | Admitting: Speech Pathology

## 2016-11-22 DIAGNOSIS — R471 Dysarthria and anarthria: Secondary | ICD-10-CM | POA: Diagnosis not present

## 2016-11-22 NOTE — Therapy (Signed)
[facility] [address] [place], [place], [address] Phone: [phone]   Fax:  [phone]  Speech Language Pathology Treatment  Patient Details  Name: Jeffrey [name] MRN: [id number] Date of Birth: Jun 27, 1941 Referring Provider: Loura Pardon, MD  Encounter Date: 11/22/2016      End of Session - 11/22/16 1559    Visit Number 4   Number of Visits 17   Date for SLP Re-Evaluation 01/14/17   SLP Start Time 30   SLP Stop Time  1444   SLP Time Calculation (min) 43 min   Activity Tolerance Patient tolerated treatment well      Past Medical History:  Diagnosis Date  . DDD (degenerative disc disease)    with epidural injection  . Gastritis    H pylori, partial tx.  EGD 04/2002  . Gout   . History of skin cancer   . Hyperlipidemia   . Hypertension    some white coat component- better at home    Past Surgical History:  Procedure Laterality Date  . HEMORRHOID SURGERY  1997    There were no vitals filed for this visit.      Subjective Assessment - 11/22/16 1406    Subjective "I have a little bit of problem with the long sentences." re: home exercises   Currently in Pain? No/denies               ADULT SLP TREATMENT - 11/22/16 0001      General Information   Behavior/Cognition Alert;Cooperative;Pleasant mood     Treatment Provided   Treatment provided Cognitive-Linquistic     Pain Assessment   Pain Assessment No/denies pain     Cognitive-Linquistic Treatment   Treatment focused on Dysarthria;Patient/family/caregiver education   Skilled Treatment Patient identified effective strategies for dysarthria including slowed rate, overarticulation and increased vocal amplitude. Instructed patient to utilize these techniques in sentence level reading task; he completes with 95% accuracy following initial instruction. Engaged patient in simple open-ended question task to encourage carryover of strategies to  spontaneous conversation. Patient read sentence level questions with 95% accuracy for use of dysarthria strategies, and provided a phrase or sentence level spontaneous response with 80% accuracy. With min cues, he identifies errors and improves intelligibility with slowed rate, overarticulation. In paragraph reading task, patient utilizes strategies with 90% accuracy, requires min A to use strategies to improve intelligibility. Instructed pt to continue use of strategies in brief conversation. Patient utilized strategies with 80% accuracy during 9 minute conversation, and independently identified and self-corrected 2 instances of reduced intelligibility. Patient reports Spanish reading has been "easier," but he is still having trouble with alveolar /r/ trill. Identified some appropriate tongue twisters in Spanish containing this target for patient to add to home exercise program.      Assessment / Recommendations / [place] with current plan of care     Progression Toward Goals   Progression toward goals Progressing toward goals          SLP Education - 11/22/16 1559    Education provided Yes   Education Details HEP, compensatory strategies for dysarthria   Person(s) Educated Patient   Methods Explanation;Demonstration;Verbal cues   Comprehension Verbalized understanding;Returned demonstration;Verbal cues required          SLP Short Term Goals - 11/22/16 1602      SLP SHORT TERM GOAL #1   Title pt will complete HEP for dysarthria with rare min A over three sessions  Baseline 2.5.18, 2.12.18   Time 3   Period Weeks   Status On-going     SLP SHORT TERM GOAL #2   Title pt will use compensatory strategies for dysarthria 80% of the time in 5 minutes simple converastion   Baseline 2.12.18   Time 3   Period Weeks   Status Achieved     SLP SHORT TERM GOAL #3   Title pt will demo compenastory strategies for dysarthria in 95% of sentence responses to therapy stimuli    Status Achieved          SLP Long Term Goals - 11/22/16 1604      SLP LONG TERM GOAL #1   Title pt will report less frequent requests from people for repeats compared to prior to ST   Time 7   Period Weeks   Status On-going     SLP LONG TERM GOAL #2   Title pt will demo HEP with modified independence over two sessions   Time 7   Period Weeks   Status On-going     SLP LONG TERM GOAL #3   Title pt will use compenastory strategies for dysarthria to improve intelligiblity to 95% over 10 minutes mod complex conversation   Time 7   Period Weeks   Status On-going          Plan - 11/22/16 1600    Clinical Impression Statement Pt presents today with mild dysarthria reducing pt's overall intelligibility due to his accent added to his dysarthria. Pt's ability to self monitor overarticulation is improving, as well as his habitualization of overarticulation at levels > sentence level. With SLP instruction and feedback, patient is improving use of strategies at conversation level. Skilled ST is necessary to teach pt how to habitualize compensatory strategies for dysarthria as well as to assess pt's completion of HEP.   Speech Therapy Frequency 2x / week   Duration Other (comment)   Treatment/Interventions Oral motor exercises;Compensatory strategies;Patient/family education;Functional tasks;SLP instruction and feedback;Internal/external aids   Potential to Achieve Goals Good   SLP Home Exercise Plan HEP for dysarthria, tongue twisters, sentence level tasks in [place] and [place]   Consulted and Agree with Plan of Care Patient      Patient will benefit from skilled therapeutic intervention in order to improve the following deficits and impairments:   Dysarthria and anarthria    Problem List Patient Active Problem List   Diagnosis Date Noted  . CVA (cerebral vascular accident) ([facility]) 11/08/2016  . Acute sinusitis 11/08/2016  . Constipation 11/08/2016  . Counseling for travel  07/21/2016  . Acute prostatitis 07/21/2016  . Urine culture positive 07/21/2016  . Dysuria 04/27/2016  . Urinary frequency 04/27/2016  . Cerumen impaction 01/14/2016  . Blurred vision 11/05/2014  . Prostate cancer screening 11/05/2014  . Family history of colon cancer 08/01/2013  . Colon cancer screening 08/01/2013  . Routine general medical examination at a health care facility 08/01/2013  . PSA, INCREASED 09/07/2010  . 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 Thrombocytopenia ([facility]) 01/16/2009  . TRANSAMINASES, SERUM, ELEVATED 10/26/2007  . Hypertriglyceridemia 06/08/2007  . GOUT 06/08/2007  . Essential hypertension 06/08/2007  . Hyperglycemia 06/08/2007  . SKIN CANCER, HX OF 06/08/2007   Deneise Lever, MS CF-SLP Speech-Language Pathologist  Aliene Altes 11/22/2016, 4:05 PM  [facility] [address] [place], [place], [address] Phone: [phone]   Fax:  [phone]   Name: Jeffrey [name] MRN:  [id number] Date of Birth: Sep 11, 1941

## 2016-11-24 ENCOUNTER — Ambulatory Visit (INDEPENDENT_AMBULATORY_CARE_PROVIDER_SITE_OTHER): Payer: PPO | Admitting: Internal Medicine

## 2016-11-24 ENCOUNTER — Ambulatory Visit: Payer: PPO

## 2016-11-24 ENCOUNTER — Other Ambulatory Visit
Admission: RE | Admit: 2016-11-24 | Discharge: 2016-11-24 | Disposition: A | Discharge status: Home / Self Care | Payer: PPO | Source: Ambulatory Visit | Attending: Internal Medicine | Admitting: Internal Medicine

## 2016-11-24 ENCOUNTER — Encounter: Payer: Self-pay | Admitting: Internal Medicine

## 2016-11-24 VITALS — BP 148/89 | HR 51 | Ht 72.0 in | Wt 180.5 lb

## 2016-11-24 DIAGNOSIS — I639 Cerebral infarction, unspecified: Secondary | ICD-10-CM | POA: Diagnosis not present

## 2016-11-24 DIAGNOSIS — I10 Essential (primary) hypertension: Secondary | ICD-10-CM | POA: Insufficient documentation

## 2016-11-24 DIAGNOSIS — E785 Hyperlipidemia, unspecified: Secondary | ICD-10-CM | POA: Diagnosis not present

## 2016-11-24 DIAGNOSIS — R471 Dysarthria and anarthria: Secondary | ICD-10-CM | POA: Diagnosis not present

## 2016-11-24 LAB — BASIC METABOLIC PANEL
Anion gap: 5 (ref 5–15)
BUN: 12 mg/dL (ref 6–20)
CALCIUM: 8.8 mg/dL — AB (ref 8.9–10.3)
CO2: 31 mmol/L (ref 22–32)
Chloride: 102 mmol/L (ref 101–111)
Creatinine, Ser: 0.87 mg/dL (ref 0.61–1.24)
GFR calc Af Amer: >60 mL/min (ref >60)
Glucose, Bld: 104 mg/dL — ABNORMAL HIGH (ref 65–99)
Potassium: 3.7 mmol/L (ref 3.5–5.1)
SODIUM: 138 mmol/L (ref 135–145)

## 2016-11-24 MED ORDER — HYDROCHLOROTHIAZIDE 25 MG PO TABS: 25.0000 mg | ORAL_TABLET | Freq: Every day | ORAL | 3 refills | Status: DC

## 2016-11-24 MED ORDER — BISOPROLOL FUMARATE 5 MG PO TABS: 5.0000 mg | ORAL_TABLET | Freq: Every day | ORAL | 3 refills | Status: DC

## 2016-11-24 NOTE — Progress Notes (Signed)
 New Outpatient Visit Date: 11/24/2016  Referring Provider: Pramod Sethi, MD [facility]  Chief Complaint: Stroke  HPI:  Mr. Antigua is a 75 y.o. year-old male with history of hypertension, hyperlipidemia, and stroke in 10/2015, who has been referred by Dr. Sethi for evaluation of cryptogenic stroke. Mr. Pharr was visiting [place] in early January. At the time, he had a sinus infection and had difficulty sleeping. He was up in the early morning hours of 1/418 and around 7:30 AM began noticing difficulty speaking. He did not seek medical attention until later in the afternoon after this symptom peristed. Upon evaluation by an emergency physician, the patient was diagnosed with an acute stroke. He was initially hospitalized in the town where he was staying and was subsequently transferred to another facility to undergo MRI. This confirmed a small acute stroke. His only deficit at the time was speech difficulty. This has gradually improved though is not back to his baseline. He denies other deficits.  Mr. Shevchenko denies a history of prior stroke and other cardiovascular disease. He has not had any chest pain, shortness of breath, palpitations, lightheadedness, orthopnea, PND, or edema. He continues to deal with sinus problems, having recently completed courses of prednisone and antibiotics.  Over the last 2 weeks, Mr. Bacot has been monitoring his blood pressure, heart rate, and blood sugar at home. Blood pressures have ranged from 117-161/55-84. Heart rate is typically in the 50s or low 60s. Blood sugar has been somewhat elevated over the last week, albeit the setting of recent corticosteroid use. Due to elevated blood sugar at his recent PCP visit, hydrochlorothiazide was added to Mr. Punch's blood pressure regimen.  --------------------------------------------------------------------------------------------------  Cardiovascular History & Procedures: Cardiovascular  Problems:  Cryptogenic stroke  Risk Factors:  Hypertension, hyperlipidemia, male gender, and age greater than 55  Cath/PCI:  None  CV Surgery:  None  EP Procedures and Devices:  24-hour Holter monitor (10/20/16, [place]): Predominant rhythm was sinus with an average rate of 63 bpm (range 46-93 bpm). Rare supraventricular and ventricular ectopy was identified. There was one episode of nonsustained ventricular tachycardia lasting 4 beats. No sustained arrhythmias, including atrial fibrillation/flutter) are evident.  Non-Invasive Evaluation(s):  Transthoracic echocardiogram (10/2016; [place]): Reportedly normal, though no images or reports is available for review.  Recent CV Pertinent Labs: Lab Results  Component Value Date   CHOL 148 01/12/2016   HDL 51.50 01/12/2016   LDLCALC 65 01/12/2016   LDLDIRECT 55.0 02/10/2015   TRIG 157.0 (H) 01/12/2016   CHOLHDL 3 01/12/2016   K 3.8 04/27/2016   K 3.9 05/15/2009   BUN 12 04/27/2016   BUN 12 05/15/2009   CREATININE 0.74 04/27/2016   CREATININE 0.8 05/15/2009    --------------------------------------------------------------------------------------------------  Past Medical History:  Diagnosis Date  . DDD (degenerative disc disease)    with epidural injection  . Gastritis    H pylori, partial tx.  EGD 04/2002  . Gout   . History of skin cancer   . Hyperlipidemia   . Hypertension    some white coat component- better at home  . Stroke (HCC)     Past Surgical History:  Procedure Laterality Date  . HEMORRHOID SURGERY  1997    Outpatient Encounter Prescriptions as of 11/24/2016  Medication Sig  . allopurinol (ZYLOPRIM) 300 MG tablet TAKE 1 TABLET(300 MG) BY MOUTH DAILY  . Ascorbic Acid (VITAMIN C) 1000 MG tablet Take 1,000 mg by mouth daily.    . aspirin 325 MG tablet Take 325 mg   by mouth daily.  . atorvastatin (LIPITOR) 20 MG tablet Take 1 tablet (20 mg total) by mouth daily.  . bisoprolol (ZEBETA) 5 MG tablet Take  1.5 tablets (7.5 mg total) by mouth daily.  . CALCIUM PO Take by mouth daily.    . Calcium-Magnesium-Zinc 1000-400-15 MG TABS Take 1 tablet by mouth daily.    . hydrochlorothiazide (HYDRODIURIL) 12.5 MG tablet Take 1 tablet (12.5 mg total) by mouth daily.  . lisinopril (PRINIVIL,ZESTRIL) 20 MG tablet TAKE 1 TABLET(20 MG) BY MOUTH DAILY  . Omega-3 Fatty Acids (FISH OIL PO) Take one capsule by mouth once a day   . VITAMIN E PO Take by mouth daily.    . [DISCONTINUED] predniSONE (DELTASONE) 20 MG tablet 2 tablets by mouth daily x 4 days, 1 tab by mouth daily x 3 days and stop. (Patient not taking: Reported on 11/24/2016)   No facility-administered encounter medications on file as of 11/24/2016.     Allergies: Patient has no known allergies.  Social History   Social History  . Marital status: Married    Spouse name: N/A  . Number of children: 3  . Years of education: N/A   Occupational History  . [employer]    Social History Main Topics  . Smoking status: Former Smoker    Packs/day: 1.00    Years: 8.00    Types: Cigarettes    Quit date: 1973  . Smokeless tobacco: Never Used  . Alcohol use 4.2 oz/week    7 Glasses of wine per week  . Drug use: No  . Sexual activity: No   Other Topics Concern  . Not on file   Social History Narrative   From [place]    Family History  Problem Relation Age of Onset  . Other Mother     brain tumor  . Skin cancer Mother     suspect melanoma  . Heart attack Father 77  . Colon cancer Sister     Review of Systems: A 12-system review of systems was performed and was negative except as noted in the HPI.  --------------------------------------------------------------------------------------------------  Physical Exam: BP (!) 148/89 (BP Location: Left Arm, Patient Position: Sitting, Cuff Size: Normal)   Pulse (!) 51   Ht 6' (1.829 m)   Wt 180 lb 8 oz (81.9 kg)   BMI 24.48 kg/m   General:  Well-developed, well-nourished  man seated comfortably in the exam room. HEENT: No conjunctival pallor or scleral icterus.  Moist mucous membranes.  OP clear. Neck: Supple without lymphadenopathy, thyromegaly, JVD, or HJR.  No carotid bruit. Lungs: Normal work of breathing.  Clear to auscultation bilaterally without wheezes or crackles. Heart: Bradycardic and regular without murmurs, rubs, or gallops.  Non-displaced PMI. Abd: Bowel sounds present.  Soft, NT/ND without hepatosplenomegaly Ext: No lower extremity edema.  Radial, PT, and DP pulses are 2+ bilaterally Skin: warm and dry without rash Neuro: CNIII-XII intact.  Slight speech disturbance noted with subtle lisp. Strength and fine-touch sensation intact in upper and lower extremities bilaterally. Psych: Normal mood and affect.  EKG:  Sinus bradycardia (rate 51 bpm) with no significant abnormalities. No prior tracing available for comparison.  Lab Results  Component Value Date   WBC 11.1 (H) 04/27/2016   HGB 13.8 04/27/2016   HCT 41.0 04/27/2016   MCV 97.2 04/27/2016   PLT 96.0 (L) 04/27/2016    Lab Results  Component Value Date   NA 139 04/27/2016   K 3.8 04/27/2016   CL 104   04/27/2016   CO2 29 04/27/2016   BUN 12 04/27/2016   CREATININE 0.74 04/27/2016   GLUCOSE 150 (H) 04/27/2016   ALT 21 04/27/2016    Lab Results  Component Value Date   CHOL 148 01/12/2016   HDL 51.50 01/12/2016   LDLCALC 65 01/12/2016   LDLDIRECT 55.0 02/10/2015   TRIG 157.0 (H) 01/12/2016   CHOLHDL 3 01/12/2016    --------------------------------------------------------------------------------------------------  ASSESSMENT AND PLAN: Cryptogenic stroke Per Mr. Nowling's report and Dr. Sethi's note, a clear cause of the patient's acute CVA in 10/2016 has not been identified. His risk factors include hypertension, hyperlipidemia, and age. We have discussed potential mechanisms for stroke, including paroxysmal arrhythmias such as atrial fibrillation as well as paradoxical emboli  from right to left intracardiac shunt. As we do not have records regarding echocardiogram performed in [place], it would be reasonable to repeat a transthoracic echocardiogram to exclude obvious structural abnormality predisposing to stroke. I will touch base with Dr. Sethi to see if he is in agreement with this. If transthoracic echo is unrevealing, we will proceed with transesophageal echocardiogram and implantable loop recorder placement (if TEE is negative). In the meantime, Mr. Baray should continue with aspirin, atorvastatin, and blood pressure control.  Hypertension Pressure is suboptimally controlled today. Review of his home readings is notable for frequent upper normal to mildly elevated blood pressure. Heart rate is also fairly low today, consistent with Holter monitor in [place] and home checks. We have agreed to decrease bisoprolol to 5 mg daily and increase hydrochlorothiazide to 25 mg daily. We will check a basic metabolic panel today to ensure that renal function and electrolytes are stable, given recent addition of HCTZ.  Hyperlipidemia Patient to continue statin therapy. We will defer ongoing management to Dr. Tower.  Follow-up: Return to clinic in 3 months.  Zavian Slowey, MD 11/24/2016 3:27 PM  

## 2016-11-24 NOTE — Patient Instructions (Signed)
Talk with your wife and focus on using compensations 100% of the time.

## 2016-11-24 NOTE — Therapy (Signed)
[facility] [address] [place], [place], [address] Phone: [phone]   Fax:  [phone]  Speech Language Pathology Treatment  Patient Details  Name: Jeffrey [name] MRN: [id number] Date of Birth: 05-20-1941 Referring Provider: Loura Pardon, MD  Encounter Date: 11/24/2016      End of Session - 11/24/16 1639    Visit Number 5   Number of Visits 17   Date for SLP Re-Evaluation 01/14/17   SLP Start Time 1532   SLP Stop Time  1612   SLP Time Calculation (min) 40 min   Activity Tolerance Patient tolerated treatment well      Past Medical History:  Diagnosis Date  . DDD (degenerative disc disease)    with epidural injection  . Gastritis    H pylori, partial tx.  EGD 04/2002  . Gout   . History of skin cancer   . Hyperlipidemia   . Hypertension    some white coat component- better at home  . Stroke [facility])     Past Surgical History:  Procedure Laterality Date  . HEMORRHOID SURGERY  1997    There were no vitals filed for this visit.      Subjective Assessment - 11/24/16 1534    Subjective Pt reports trill /r/ sentences are getting more clear in homework.   Currently in Pain? No/denies               ADULT SLP TREATMENT - 11/24/16 1536      General Information   Behavior/Cognition Alert;Cooperative;Pleasant mood     Treatment Provided   Treatment provided Cognitive-Linquistic     Cognitive-Linquistic Treatment   Treatment focused on Dysarthria   Skilled Treatment Pt was engaged in session with sentence responses after SLP reiterated speech clarity compensatory measures. Success using measures in 12 minutes of structured responses was 95%. Multiple sentence responses utilizing compensations were completed with 95% success and outdoors pt utilized compensations with 85-90% success. Intelligibility outdoors was 100%     Assessment / Recommendations / Plan   Plan Continue with current plan of care      Progression Toward Goals   Progression toward goals Progressing toward goals            SLP Short Term Goals - 11/24/16 1637      SLP SHORT TERM GOAL #1   Title pt will complete HEP for dysarthria with rare min A over three sessions   Baseline 2.5.18, 2.12.18   Time 2   Period Weeks   Status On-going     SLP SHORT TERM GOAL #2   Title pt will use compensatory strategies for dysarthria 80% of the time in 5 minutes simple converastion   Status Achieved     SLP SHORT TERM GOAL #3   Title pt will demo compenastory strategies for dysarthria in 95% of sentence responses to therapy stimuli   Status Achieved          SLP Long Term Goals - 11/24/16 1637      SLP LONG TERM GOAL #1   Title pt will report less frequent requests from people for repeats compared to prior to ST   Time 6   Period Weeks   Status On-going     SLP LONG TERM GOAL #2   Title pt will demo HEP with modified independence over two sessions   Time 6   Period Weeks   Status On-going     SLP LONG TERM GOAL #3  Title pt will use compenastory strategies for dysarthria to improve intelligiblity to 95% over 10 minutes mod complex conversation   Time 6   Period Weeks   Status On-going          Plan - 11/24/16 1632    Clinical Impression Statement Pt presents today with mild dysarthria largely mitigated with pt's use of speech intelligibility strategies in structured tasks, outside of therapy room, and outdoors in conversation for 7 minutes. Pt's ability to self monitor is good to excellent today, and habitualizing strategies to normalize pt's articulation are progressing in conversation with distractors. Skilled ST is necessary to ensure consistency in habitualizing compensatory strategies for dysarthria across speaking situations. Consideration for reduction to x1/week next week is warranted at this time.   Speech Therapy Frequency 2x / week   Duration Other (comment)  8 weeks    Treatment/Interventions Oral motor exercises;Compensatory strategies;Patient/family education;Functional tasks;SLP instruction and feedback;Internal/external aids   Potential to Achieve Goals Good   Consulted and Agree with Plan of Care Patient      Patient will benefit from skilled therapeutic intervention in order to improve the following deficits and impairments:   Dysarthria and anarthria    Problem List Patient Active Problem List   Diagnosis Date Noted  . Cryptogenic stroke ([place]) 11/08/2016  . Acute sinusitis 11/08/2016  . Constipation 11/08/2016  . Counseling for travel 07/21/2016  . Acute prostatitis 07/21/2016  . Urine culture positive 07/21/2016  . Dysuria 04/27/2016  . Urinary frequency 04/27/2016  . Cerumen impaction 01/14/2016  . Blurred vision 11/05/2014  . Prostate cancer screening 11/05/2014  . Family history of colon cancer 08/01/2013  . Colon cancer screening 08/01/2013  . Routine general medical examination at a health care facility 08/01/2013  . PSA, INCREASED 09/07/2010  . 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 Thrombocytopenia ([place]) 01/16/2009  . TRANSAMINASES, SERUM, ELEVATED 10/26/2007  . Hyperlipidemia 06/08/2007  . GOUT 06/08/2007  . Essential hypertension 06/08/2007  . Hyperglycemia 06/08/2007  . SKIN CANCER, HX OF 06/08/2007    [facility] ,MS, CCC-SLP  11/24/2016, 4:39 PM  [facility] [address] [place], [place], [address] Phone: [phone]   Fax:  [phone]   Name: Jeffrey [name] MRN: [id number] Date of Birth: 1941/05/19

## 2016-11-24 NOTE — Patient Instructions (Signed)
Medication Instructions:  Your physician has recommended you make the following change in your medication:  1- INCREASE    HCTZ (Hydrocholorothiazide) to 25 mg by mouth once a day. 2- CHANGE  Bisoprolol to 5 mg by mouth once a day.   Labwork: Your physician recommends that you return for lab work in: TODAY (BMP). - Please go to the [facility]. You will check in at the front desk to the right as you walk into the atrium. Valet Parking is offered if needed.    Testing/Procedures: none  Follow-Up: Your physician recommends that you schedule a follow-up appointment in: 3 MONTHS WITH DR END.  If you need a refill on your cardiac medications before your next appointment, please call your pharmacy.

## 2016-11-25 ENCOUNTER — Telehealth: Payer: Self-pay | Admitting: *Deleted

## 2016-11-25 DIAGNOSIS — I10 Essential (primary) hypertension: Secondary | ICD-10-CM

## 2016-11-25 DIAGNOSIS — I639 Cerebral infarction, unspecified: Secondary | ICD-10-CM

## 2016-11-25 NOTE — Telephone Encounter (Signed)
Nelva Bush, MD  Vanessa Ralphs, RN        Hi Anderson Malta,   I heard back from Dr. Leonie Man regarding Mr. Degaetano. Can we set him up for a transthoracic echo in the next week or so? Based on the results, we will then decide about proceeding with TEE +/- loop recorder. Thanks.   Chris    Patient needs echo scheduled. Left message for patient to call back on home and cell number.

## 2016-11-25 NOTE — Telephone Encounter (Signed)
No answer. Left message to call back.   Scheduled patient for echo on 12/02/16 at 4 pm in our office.  Need to make sure patient can come this date and time. If not, patient may need to have echo at the hospital as next available echo in our office is in March.

## 2016-11-26 NOTE — Telephone Encounter (Signed)
Pt called back, I confirmed that 2/22 at 4 was convenient for him for echocardiogram. Pt verbalized this would work for his schedule

## 2016-11-29 ENCOUNTER — Telehealth: Payer: Self-pay

## 2016-11-29 ENCOUNTER — Ambulatory Visit: Payer: PPO | Admitting: Speech Pathology

## 2016-11-29 DIAGNOSIS — R471 Dysarthria and anarthria: Secondary | ICD-10-CM | POA: Diagnosis not present

## 2016-11-29 NOTE — Telephone Encounter (Signed)
I called Jeffrey [name] to advise him that Dr. Rexene Alberts will be out of the office on 12/01/2016 and we will need to r/s his appt.  I left a message on both cell and work number asking him to call me back. If Jeffrey [name] calls back, please reschedule him with Dr. Rexene Alberts for his sleep consult.

## 2016-11-29 NOTE — Therapy (Signed)
[facility] [address] [place], [place], [address] Phone: [phone]   Fax:  [phone]  Speech Language Pathology Treatment  Patient Details  Name: Jeffrey [name] MRN: [id number] Date of Birth: 08-10-1941 Referring Provider: Loura Pardon, MD  Encounter Date: 11/29/2016      End of Session - 11/29/16 1729    Visit Number 6   Number of Visits 17   Date for SLP Re-Evaluation 01/14/17   SLP Start Time 1455   SLP Stop Time  1530   SLP Time Calculation (min) 35 min   Activity Tolerance Patient tolerated treatment well      Past Medical History:  Diagnosis Date  . DDD (degenerative disc disease)    with epidural injection  . Gastritis    H pylori, partial tx.  EGD 04/2002  . Gout   . History of skin cancer   . Hyperlipidemia   . Hypertension    some white coat component- better at home  . Stroke [facility])     Past Surgical History:  Procedure Laterality Date  . HEMORRHOID SURGERY  1997    There were no vitals filed for this visit.      Subjective Assessment - 11/29/16 1458    Subjective "I didn't make as much progress as I was hoping to"   Currently in Pain? No/denies               ADULT SLP TREATMENT - 11/29/16 0001      General Information   Behavior/Cognition Alert;Cooperative;Pleasant mood     Treatment Provided   Treatment provided Cognitive-Linquistic     Pain Assessment   Pain Assessment No/denies pain     Cognitive-Linquistic Treatment   Treatment focused on Dysarthria   Skilled Treatment Patient stated he went to the beach over the weekend, ordered food at a restaurant without difficulty and without requests for repetition. Instructed patient to use strategies of slowed rate, overarticulation for multi-paragraph level reading task. Patient initially with 75% intelligibility. Provided demonstration and instruction for emphasis of stop consonants, particularly in word-final position,  to improve intelligibility. Patient verbalizes understanding, implements with 90% accuracy with initial min-mod A with SLP fading to rare cues. Provided education regarding home exercise for Spanish language for use of News in Slow Spanish to assist with modeling speaking rate. Instructed patient for paragraph level reading task to identify consonants to stress to improve intelligibility.      Assessment / Recommendations / Plan   Plan Continue with current plan of care     Progression Toward Goals   Progression toward goals Progressing toward goals          SLP Education - 11/29/16 1729    Education provided Yes   Education Details HEP, compensatory strategies for dysarthria   Person(s) Educated Patient   Methods Explanation;Demonstration;Verbal cues   Comprehension Returned demonstration;Verbalized understanding          SLP Short Term Goals - 11/29/16 1743      SLP SHORT TERM GOAL #1   Title pt will complete HEP for dysarthria with rare min A over three sessions   Baseline 2.5.18, 2.12.18, 2.19.18   Time 2   Period Weeks   Status Achieved     SLP SHORT TERM GOAL #2   Title pt will use compensatory strategies for dysarthria 80% of the time in 5 minutes simple converastion   Baseline 2.12.18, 2.19.18   Time 2   Period Weeks   Status  Achieved     SLP SHORT TERM GOAL #3   Title pt will demo compenastory strategies for dysarthria in 95% of sentence responses to therapy stimuli   Baseline 2.5.18   Status Achieved          SLP Long Term Goals - 11/29/16 1744      SLP LONG TERM GOAL #1   Title pt will report less frequent requests from people for repeats compared to prior to ST   Time 5   Period Weeks   Status On-going     SLP LONG TERM GOAL #2   Title pt will demo HEP with modified independence over two sessions   Time 5   Period Weeks   Status On-going     SLP LONG TERM GOAL #3   Title pt will use compenastory strategies for dysarthria to improve  intelligiblity to 95% over 10 minutes mod complex conversation   Time 5   Period Weeks   Status On-going          Plan - 11/29/16 1742    Speech Therapy Frequency --   Duration --   Treatment/Interventions --   Potential to Achieve Goals Good   SLP Home Exercise Plan --   Consulted and Agree with Plan of Care --      Patient will benefit from skilled therapeutic intervention in order to improve the following deficits and impairments:   Dysarthria and anarthria    Problem List Patient Active Problem List   Diagnosis Date Noted  . Cryptogenic stroke ([place]) 11/08/2016  . Acute sinusitis 11/08/2016  . Constipation 11/08/2016  . Counseling for travel 07/21/2016  . Acute prostatitis 07/21/2016  . Urine culture positive 07/21/2016  . Dysuria 04/27/2016  . Urinary frequency 04/27/2016  . Cerumen impaction 01/14/2016  . Blurred vision 11/05/2014  . Prostate cancer screening 11/05/2014  . Family history of colon cancer 08/01/2013  . Colon cancer screening 08/01/2013  . Routine general medical examination at a health care facility 08/01/2013  . PSA, INCREASED 09/07/2010  . 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 Thrombocytopenia ([place]) 01/16/2009  . TRANSAMINASES, SERUM, ELEVATED 10/26/2007  . Hyperlipidemia 06/08/2007  . GOUT 06/08/2007  . Essential hypertension 06/08/2007  . Hyperglycemia 06/08/2007  . SKIN CANCER, HX OF 06/08/2007   Deneise Lever, MS CF-SLP Speech-Language Pathologist   Aliene Altes 11/29/2016, 5:45 PM  [facility] [address] [place], [place], [address] Phone: [phone]   Fax:  [phone]   Name: ANTINIO BURTENSHAW MRN: [id number] Date of Birth: July 30, 1941

## 2016-12-01 ENCOUNTER — Institutional Professional Consult (permissible substitution): Payer: PPO | Admitting: Neurology

## 2016-12-01 ENCOUNTER — Ambulatory Visit: Payer: PPO

## 2016-12-01 DIAGNOSIS — R471 Dysarthria and anarthria: Secondary | ICD-10-CM | POA: Diagnosis not present

## 2016-12-01 NOTE — Therapy (Signed)
[facility] [address] [place], [place], [address] Phone: [phone]   Fax:  [phone]  Speech Language Pathology Treatment  Patient Details  Name: Jeffrey [name] MRN: [id number] Date of Birth: 1941/08/09 Referring Provider: Loura Pardon, MD  Encounter Date: 12/01/2016      End of Session - 12/01/16 1356    Visit Number 7   Number of Visits 17   Date for SLP Re-Evaluation 01/14/17   SLP Start Time 1314   SLP Stop Time  1354   SLP Time Calculation (min) 40 min   Activity Tolerance Patient tolerated treatment well      Past Medical History:  Diagnosis Date  . DDD (degenerative disc disease)    with epidural injection  . Gastritis    H pylori, partial tx.  EGD 04/2002  . Gout   . History of skin cancer   . Hyperlipidemia   . Hypertension    some white coat component- better at home  . Stroke [facility])     Past Surgical History:  Procedure Laterality Date  . HEMORRHOID SURGERY  1997    There were no vitals filed for this visit.      Subjective Assessment - 12/01/16 1318    Subjective Pt rates his speech at approx 85% of pre-morbid status.   Currently in Pain? No/denies               ADULT SLP TREATMENT - 12/01/16 1319      General Information   Behavior/Cognition Alert;Cooperative;Pleasant mood     Treatment Provided   Treatment provided Cognitive-Linquistic     Cognitive-Linquistic Treatment   Treatment focused on Dysarthria   Skilled Treatment Pt reports practicing with his wife mostly, in [place], since SLP saw pt last. Wife has markedly decr'd her requests for pt to repeat his utterances in the last ~2 weeks. He continues to work with the phrases and sentencnes of HEP but not as frequently as initially. SLP confirmed that this was ok, that practice in conversation is more important at this time and pt could perform HEP with frequency as he desired. Conversation outside [place] room today in  gym resulted in SLP assessment of pt intelligibility at 95%+ but not quite 100% on first listen by SLP. In conversation outdoors  pt maintained 98%+ intelligibility over 10 minutes of mod copmlex conversation     Assessment / Recommendations / Plan   Plan --  likely d/c in next 1-2 sessions; pt to decr to x1/week     Progression Toward Goals   Progression toward goals Progressing toward goals            SLP Short Term Goals - 12/01/16 1358      SLP SHORT TERM GOAL #1   Title pt will complete HEP for dysarthria with rare min A over three sessions   Status Achieved     SLP SHORT TERM GOAL #2   Title pt will use compensatory strategies for dysarthria 80% of the time in 5 minutes simple converastion   Status Achieved     SLP SHORT TERM GOAL #3   Title pt will demo compenastory strategies for dysarthria in 95% of sentence responses to therapy stimuli   Status Achieved          SLP Long Term Goals - 12/01/16 1358      SLP LONG TERM GOAL #1   Title pt will report less frequent requests from people for repeats compared to  prior to ST   Status Achieved     SLP LONG TERM GOAL #2   Title pt will demo HEP with modified independence over two sessions   Time 4   Period Weeks   Status On-going     SLP LONG TERM GOAL #3   Title pt will use compenastory strategies for dysarthria to improve intelligiblity to 95% over 10 minutes mod complex conversation over two sessions   Baseline 12-01-16   Time 4   Period Weeks   Status On-going          Plan - 12/01/16 1357    Clinical Impression Statement Pt presents today with intermittent mild dysarthria, again largely mitigated with pt's use of speech intelligibility strategies in conversation and in exchanges outside of therapy room/outdoors. Skilled ST is necessary to ensure consistency in habitualizing compensatory strategies for dysarthria across speaking situations. Recommend decreasing frequency to 1x per week at next session. Pt  will likely be d/c'd in 1-2 more visits.   Speech Therapy Frequency 2x / week   Duration Other (comment)   Treatment/Interventions Oral motor exercises;Compensatory strategies;Patient/family education;Functional tasks;SLP instruction and feedback;Internal/external aids   Potential to Achieve Goals Good   SLP Home Exercise Plan HEP for dysarthria, tongue twisters, sentence level tasks in [place] and [place]   Consulted and Agree with Plan of Care Patient      Patient will benefit from skilled therapeutic intervention in order to improve the following deficits and impairments:   Dysarthria and anarthria    Problem List Patient Active Problem List   Diagnosis Date Noted  . Cryptogenic stroke ([place]) 11/08/2016  . Acute sinusitis 11/08/2016  . Constipation 11/08/2016  . Counseling for travel 07/21/2016  . Acute prostatitis 07/21/2016  . Urine culture positive 07/21/2016  . Dysuria 04/27/2016  . Urinary frequency 04/27/2016  . Cerumen impaction 01/14/2016  . Blurred vision 11/05/2014  . Prostate cancer screening 11/05/2014  . Family history of colon cancer 08/01/2013  . Colon cancer screening 08/01/2013  . Routine general medical examination at a health care facility 08/01/2013  . PSA, INCREASED 09/07/2010  . 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 Thrombocytopenia ([place]) 01/16/2009  . TRANSAMINASES, SERUM, ELEVATED 10/26/2007  . Hyperlipidemia 06/08/2007  . GOUT 06/08/2007  . Essential hypertension 06/08/2007  . Hyperglycemia 06/08/2007  . SKIN CANCER, HX OF 06/08/2007    [facility] ,MS, CCC-SLP  12/01/2016, 2:01 PM  [facility] [address] [place], [place], [address] Phone: [phone]   Fax:  [phone]   Name: Jeffrey [name] MRN: [id number] Date of Birth: [birth year]

## 2016-12-02 ENCOUNTER — Ambulatory Visit (INDEPENDENT_AMBULATORY_CARE_PROVIDER_SITE_OTHER): Payer: PPO

## 2016-12-02 ENCOUNTER — Other Ambulatory Visit: Payer: Self-pay

## 2016-12-02 DIAGNOSIS — I10 Essential (primary) hypertension: Secondary | ICD-10-CM

## 2016-12-02 DIAGNOSIS — I639 Cerebral infarction, unspecified: Secondary | ICD-10-CM | POA: Diagnosis not present

## 2016-12-06 ENCOUNTER — Encounter: Payer: PPO | Admitting: Speech Pathology

## 2016-12-08 ENCOUNTER — Ambulatory Visit: Payer: PPO

## 2016-12-08 ENCOUNTER — Telehealth: Payer: Self-pay | Admitting: Internal Medicine

## 2016-12-08 DIAGNOSIS — R471 Dysarthria and anarthria: Secondary | ICD-10-CM

## 2016-12-08 DIAGNOSIS — Z01818 Encounter for other preprocedural examination: Secondary | ICD-10-CM

## 2016-12-08 DIAGNOSIS — I639 Cerebral infarction, unspecified: Secondary | ICD-10-CM

## 2016-12-08 DIAGNOSIS — I10 Essential (primary) hypertension: Secondary | ICD-10-CM

## 2016-12-08 NOTE — Telephone Encounter (Signed)
I spoke with Mr. Tinker regarding the results of his echo. We have agreed to proceed with TEE +/- loop recorder. We will schedule this in the next 1-2 weeks.  Nelva Bush, MD [facility] Pager: [phone]

## 2016-12-08 NOTE — Therapy (Signed)
[facility] [address] [place], [place], [address] Phone: [phone]   Fax:  [phone]  Speech Language Pathology Treatment  Patient Details  Name: Jeffrey [name] MRN: [id number] Date of Birth: 07-01-1941 Referring Provider: Loura Pardon, MD  Encounter Date: 12/08/2016      End of Session - 12/08/16 1608    Visit Number 8   Number of Visits 17   Date for SLP Re-Evaluation 01/14/17   SLP Start Time 1448   SLP Stop Time  [id number]   SLP Time Calculation (min) 37 min      Past Medical History:  Diagnosis Date  . DDD (degenerative disc disease)    with epidural injection  . Gastritis    H pylori, partial tx.  EGD 04/2002  . Gout   . History of skin cancer   . Hyperlipidemia   . Hypertension    some white coat component- better at home  . Stroke [facility])     Past Surgical History:  Procedure Laterality Date  . HEMORRHOID SURGERY  1997    There were no vitals filed for this visit.      Subjective Assessment - 12/08/16 1458    Subjective Pt continues to rate his speech at approx 85% of pre-morbid status.   Currently in Pain? No/denies               ADULT SLP TREATMENT - 12/08/16 1459      General Information   Behavior/Cognition Alert;Cooperative;Pleasant mood     Treatment Provided   Treatment provided Cognitive-Linquistic     Cognitive-Linquistic Treatment   Treatment focused on Dysarthria   Skilled Treatment Pt completed HEP with excellent success. He reports using Spanish 70%, and English 30%, so SLP suggested 70% Spanish HEP, 30% English HEP. Overarticulation in conversation completed with excellent success outside [place] room. Outdoors pt maintained intelligible speech 98% of the time. Pt remarked his sister told him that he sounded "no different than before" on the phone last night.     Assessment / Recommendations / Plan   Plan Continue with current plan of care  suspect next visit d/c      Progression Toward Goals   Progression toward goals Progressing toward goals            SLP Short Term Goals - 12/01/16 1358      SLP SHORT TERM GOAL #1   Title pt will complete HEP for dysarthria with rare min A over three sessions   Status Achieved     SLP SHORT TERM GOAL #2   Title pt will use compensatory strategies for dysarthria 80% of the time in 5 minutes simple converastion   Status Achieved     SLP SHORT TERM GOAL #3   Title pt will demo compenastory strategies for dysarthria in 95% of sentence responses to therapy stimuli   Status Achieved          SLP Long Term Goals - 12/08/16 1456      SLP LONG TERM GOAL #1   Title pt will report less frequent requests from people for repeats compared to prior to [place] #2   Title pt will demo HEP with modified independence over two sessions   Baseline 12-08-16   Time 3   Period Weeks   Status On-going     SLP LONG TERM GOAL #3   Title pt will use compenastory strategies  for dysarthria to improve intelligiblity to 95% over 10 minutes mod complex conversation outdoors, over three sessions   Baseline 12-01-16, 12-08-16   Time 3   Period Weeks   Status Revised          Plan - 12/08/16 1456    Clinical Impression Statement Pt with intermittent mild dysarthria today, and with pt's use of speech intelligibility strategies in conversation in exchanges in and outside of therapy room pt maintained 100% intelligibilty. Outdoors was 98% intelligibility. Strongly suspect next session d/c. Skilled ST is necessary to ensure consistency in habitualizing compensatory strategies for dysarthria across speaking situations.    Speech Therapy Frequency 2x / week   Duration Other (comment)   Treatment/Interventions Oral motor exercises;Compensatory strategies;Patient/family education;Functional tasks;SLP instruction and feedback;Internal/external aids   Potential to Achieve Goals Good   SLP Home  Exercise Plan HEP for dysarthria, tongue twisters, sentence level tasks in [place] and [place]   Consulted and Agree with Plan of Care Patient      Patient will benefit from skilled therapeutic intervention in order to improve the following deficits and impairments:   Dysarthria and anarthria    Problem List Patient Active Problem List   Diagnosis Date Noted  . Cryptogenic stroke ([place]) 11/08/2016  . Acute sinusitis 11/08/2016  . Constipation 11/08/2016  . Counseling for travel 07/21/2016  . Acute prostatitis 07/21/2016  . Urine culture positive 07/21/2016  . Dysuria 04/27/2016  . Urinary frequency 04/27/2016  . Cerumen impaction 01/14/2016  . Blurred vision 11/05/2014  . Prostate cancer screening 11/05/2014  . Family history of colon cancer 08/01/2013  . Colon cancer screening 08/01/2013  . Routine general medical examination at a health care facility 08/01/2013  . PSA, INCREASED 09/07/2010  . 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 Thrombocytopenia ([place]) 01/16/2009  . TRANSAMINASES, SERUM, ELEVATED 10/26/2007  . Hyperlipidemia 06/08/2007  . GOUT 06/08/2007  . Essential hypertension 06/08/2007  . Hyperglycemia 06/08/2007  . SKIN CANCER, HX OF 06/08/2007    [facility] ,MS, CCC-SLP  12/08/2016, 4:11 PM  [facility] [address] [place], [place], [address] Phone: [phone]   Fax:  [phone]   Name: Jeffrey [name] MRN: [id number] Date of Birth: 04/20/1941

## 2016-12-09 ENCOUNTER — Encounter: Payer: Self-pay | Admitting: *Deleted

## 2016-12-09 NOTE — Telephone Encounter (Signed)
Patient agreed to have TEE/Loop recorder on 12/14/16 at 0730, arrive at 06:30 am.  Instructions given to patient to get labs at [facility] today or tomorrow, do not eat or drink after midnight the day of procedure. Patient said he was currently driving and asked for release any additional instructions to his MyChart.  Instructions released to MyChart per patient request.

## 2016-12-09 NOTE — Addendum Note (Signed)
Addended by: Vanessa Ralphs on: 12/09/2016 01:21 PM   Modules accepted: Orders

## 2016-12-10 ENCOUNTER — Other Ambulatory Visit
Admission: RE | Admit: 2016-12-10 | Discharge: 2016-12-10 | Disposition: A | Discharge status: Home / Self Care | Payer: PPO | Source: Ambulatory Visit | Attending: Internal Medicine | Admitting: Internal Medicine

## 2016-12-10 DIAGNOSIS — I10 Essential (primary) hypertension: Secondary | ICD-10-CM | POA: Diagnosis not present

## 2016-12-10 DIAGNOSIS — I639 Cerebral infarction, unspecified: Secondary | ICD-10-CM | POA: Diagnosis not present

## 2016-12-10 DIAGNOSIS — Z01818 Encounter for other preprocedural examination: Secondary | ICD-10-CM | POA: Diagnosis not present

## 2016-12-10 LAB — CBC WITH DIFFERENTIAL/PLATELET
BASOS ABS: 0 10*3/uL (ref 0–0.1)
BASOS PCT: 1 %
EOS ABS: 0.3 10*3/uL (ref 0–0.7)
Eosinophils Relative: 4 %
HCT: 41.5 % (ref 40.0–52.0)
HEMOGLOBIN: 14.5 g/dL (ref 13.0–18.0)
Lymphocytes Relative: 17 %
Lymphs Abs: 1.1 10*3/uL (ref 1.0–3.6)
MCH: 32.8 pg (ref 26.0–34.0)
MCHC: 35 g/dL (ref 32.0–36.0)
MCV: 93.7 fL (ref 80.0–100.0)
MONOS PCT: 7 %
Monocytes Absolute: 0.4 10*3/uL (ref 0.2–1.0)
NEUTROS PCT: 71 %
Neutro Abs: 4.5 10*3/uL (ref 1.4–6.5)
Platelets: 116 10*3/uL — ABNORMAL LOW (ref 150–440)
RBC: 4.44 MIL/uL (ref 4.40–5.90)
RDW: 13.2 % (ref 11.5–14.5)
WBC: 6.3 10*3/uL (ref 3.8–10.6)

## 2016-12-10 LAB — BASIC METABOLIC PANEL
ANION GAP: 8 (ref 5–15)
BUN: 11 mg/dL (ref 6–20)
CALCIUM: 8.9 mg/dL (ref 8.9–10.3)
CO2: 28 mmol/L (ref 22–32)
CREATININE: 0.77 mg/dL (ref 0.61–1.24)
Chloride: 103 mmol/L (ref 101–111)
GFR calc non Af Amer: >60 mL/min (ref >60)
Glucose, Bld: 128 mg/dL — ABNORMAL HIGH (ref 65–99)
Potassium: 3.6 mmol/L (ref 3.5–5.1)
Sodium: 139 mmol/L (ref 135–145)

## 2016-12-10 LAB — PROTIME-INR
INR: 1.08
PROTHROMBIN TIME: 14 s (ref 11.4–15.2)

## 2016-12-13 ENCOUNTER — Encounter: Payer: PPO | Admitting: Speech Pathology

## 2016-12-13 ENCOUNTER — Telehealth: Payer: Self-pay | Admitting: *Deleted

## 2016-12-13 NOTE — Telephone Encounter (Signed)
Called to notify patient that Loop insertion with Dr Caryl Comes would need to be rescheduled due to unforseen circumstances. Dr End wanted me to offer to patient he could go ahead and have the TEE and the Loop insertion at a later date or reschedule both. Patient opted to reschedule both procedures for a later date so they may be done at the same time. Advised patient I will discuss scheduling with Dr Olin Pia nurse and get back with him later on this week with tentative date.

## 2016-12-14 ENCOUNTER — Ambulatory Visit: Admit: 2016-12-14 | Payer: PPO | Admitting: Internal Medicine

## 2016-12-14 SURGERY — LOOP RECORDER INSERTION
Anesthesia: Moderate Sedation

## 2016-12-14 SURGERY — ECHOCARDIOGRAM, TRANSESOPHAGEAL
Anesthesia: Moderate Sedation

## 2016-12-15 NOTE — Telephone Encounter (Signed)
TEE with loop insertion rescheduled for March 13th at 0730.   Patient notified and verbalized understanding to arrive at 06:30 am at [facility] on 12/21/16. Reviewed pre-procedural instructions as previously sent to his MyChart. Patient aware he does not have to get any additional labs.

## 2016-12-20 ENCOUNTER — Encounter: Payer: Self-pay | Admitting: Family Medicine

## 2016-12-20 ENCOUNTER — Ambulatory Visit: Payer: PPO

## 2016-12-20 ENCOUNTER — Telehealth: Payer: Self-pay | Admitting: *Deleted

## 2016-12-20 NOTE — Telephone Encounter (Signed)
S/w patient about procedure for TEE/Loop insertion for tomorrow. He does not want to cancel and wants to proceed. He wants to have regardless of the weather. He verbalized understanding of preoperative instructions and arrival at [facility] on 12/21/16.

## 2016-12-21 ENCOUNTER — Encounter: Payer: Self-pay | Admitting: Anesthesiology

## 2016-12-21 ENCOUNTER — Encounter: Payer: Self-pay | Admitting: *Deleted

## 2016-12-21 ENCOUNTER — Ambulatory Visit
Admission: RE | Admit: 2016-12-21 | Discharge: 2016-12-21 | Disposition: A | Discharge status: Home / Self Care | Payer: PPO | Source: Ambulatory Visit | Attending: Internal Medicine | Admitting: Internal Medicine

## 2016-12-21 ENCOUNTER — Ambulatory Visit (INDEPENDENT_AMBULATORY_CARE_PROVIDER_SITE_OTHER): Payer: PPO | Admitting: Neurology

## 2016-12-21 ENCOUNTER — Encounter
Admission: RE | Disposition: A | Discharge status: Home / Self Care | Payer: Self-pay | Source: Ambulatory Visit | Attending: Internal Medicine

## 2016-12-21 ENCOUNTER — Other Ambulatory Visit: Payer: Self-pay | Admitting: Internal Medicine

## 2016-12-21 ENCOUNTER — Encounter: Payer: Self-pay | Admitting: Neurology

## 2016-12-21 ENCOUNTER — Ambulatory Visit (HOSPITAL_BASED_OUTPATIENT_CLINIC_OR_DEPARTMENT_OTHER)
Admission: RE | Admit: 2016-12-21 | Discharge: 2016-12-21 | Disposition: A | Discharge status: Home / Self Care | Payer: PPO | Source: Ambulatory Visit | Attending: Internal Medicine | Admitting: Internal Medicine

## 2016-12-21 VITALS — BP 130/72 | HR 70 | Resp 16 | Ht 72.0 in | Wt 181.0 lb

## 2016-12-21 DIAGNOSIS — R0681 Apnea, not elsewhere classified: Secondary | ICD-10-CM | POA: Diagnosis not present

## 2016-12-21 DIAGNOSIS — Z8673 Personal history of transient ischemic attack (TIA), and cerebral infarction without residual deficits: Secondary | ICD-10-CM

## 2016-12-21 DIAGNOSIS — R351 Nocturia: Secondary | ICD-10-CM | POA: Diagnosis not present

## 2016-12-21 DIAGNOSIS — E785 Hyperlipidemia, unspecified: Secondary | ICD-10-CM | POA: Diagnosis not present

## 2016-12-21 DIAGNOSIS — R0683 Snoring: Secondary | ICD-10-CM | POA: Diagnosis not present

## 2016-12-21 DIAGNOSIS — I10 Essential (primary) hypertension: Secondary | ICD-10-CM | POA: Diagnosis not present

## 2016-12-21 DIAGNOSIS — M109 Gout, unspecified: Secondary | ICD-10-CM | POA: Insufficient documentation

## 2016-12-21 DIAGNOSIS — Z79899 Other long term (current) drug therapy: Secondary | ICD-10-CM | POA: Insufficient documentation

## 2016-12-21 DIAGNOSIS — Z87891 Personal history of nicotine dependence: Secondary | ICD-10-CM | POA: Diagnosis not present

## 2016-12-21 DIAGNOSIS — I639 Cerebral infarction, unspecified: Secondary | ICD-10-CM

## 2016-12-21 DIAGNOSIS — I080 Rheumatic disorders of both mitral and aortic valves: Secondary | ICD-10-CM | POA: Diagnosis not present

## 2016-12-21 DIAGNOSIS — Z7982 Long term (current) use of aspirin: Secondary | ICD-10-CM | POA: Diagnosis not present

## 2016-12-21 DIAGNOSIS — I351 Nonrheumatic aortic (valve) insufficiency: Secondary | ICD-10-CM

## 2016-12-21 HISTORY — PX: TEE WITHOUT CARDIOVERSION: SHX5443

## 2016-12-21 HISTORY — PX: LOOP RECORDER INSERTION: EP1214

## 2016-12-21 SURGERY — ECHOCARDIOGRAM, TRANSESOPHAGEAL
Anesthesia: General

## 2016-12-21 SURGERY — LOOP RECORDER INSERTION
Anesthesia: LOCAL

## 2016-12-21 MED ORDER — LIDOCAINE-EPINEPHRINE (PF) 2 %-1:200000 IJ SOLN
INTRAMUSCULAR | Status: AC
  Filled 2016-12-21: qty 20

## 2016-12-21 MED ORDER — MIDAZOLAM HCL 5 MG/5ML IJ SOLN
INTRAMUSCULAR | Status: AC
  Filled 2016-12-21: qty 5

## 2016-12-21 MED ORDER — SODIUM CHLORIDE FLUSH 0.9 % IV SOLN
INTRAVENOUS | Status: AC
  Filled 2016-12-21: qty 10

## 2016-12-21 MED ORDER — SODIUM CHLORIDE 0.9 % IV SOLN
INTRAVENOUS | Status: DC
  Administered 2016-12-21: 07:00:00 via INTRAVENOUS

## 2016-12-21 MED ORDER — FENTANYL CITRATE (PF) 100 MCG/2ML IJ SOLN
INTRAMUSCULAR | Status: AC | PRN
  Administered 2016-12-21 (×2): 25 ug via INTRAVENOUS

## 2016-12-21 MED ORDER — MIDAZOLAM HCL 2 MG/2ML IJ SOLN
INTRAMUSCULAR | Status: AC | PRN
  Administered 2016-12-21: 2 mg via INTRAVENOUS
  Administered 2016-12-21 (×2): 1 mg via INTRAVENOUS

## 2016-12-21 MED ORDER — LIDOCAINE VISCOUS 2 % MT SOLN
OROMUCOSAL | Status: AC
  Filled 2016-12-21: qty 15

## 2016-12-21 MED ORDER — BUTAMBEN-TETRACAINE-BENZOCAINE 2-2-14 % EX AERO
INHALATION_SPRAY | CUTANEOUS | Status: AC
  Filled 2016-12-21: qty 20

## 2016-12-21 MED ORDER — FENTANYL CITRATE (PF) 100 MCG/2ML IJ SOLN
INTRAMUSCULAR | Status: AC
  Filled 2016-12-21: qty 2

## 2016-12-21 SURGICAL SUPPLY — 2 items
LOOP REVEAL LINQSYS (Prosthesis & Implant Heart) ×2 IMPLANT
PACK LOOP INSERTION (CUSTOM PROCEDURE TRAY) ×2 IMPLANT

## 2016-12-21 NOTE — H&P (View-Only) (Signed)
New Outpatient Visit Date: 11/24/2016  Referring Provider: Antony Contras, MD [facility]  Chief Complaint: Stroke  HPI:  Jeffrey [name] is a 76 y.o. year-old male with history of hypertension, hyperlipidemia, and stroke in 10/2015, who has been referred by Dr. Leonie Man for evaluation of cryptogenic stroke. Jeffrey [name] was visiting [place] in early January. At the time, he had a sinus infection and had difficulty sleeping. He was up in the early morning hours of 1/418 and around 7:30 AM began noticing difficulty speaking. He did not seek medical attention until later in the afternoon after this symptom peristed. Upon evaluation by an emergency physician, the patient was diagnosed with an acute stroke. He was initially hospitalized in the town where he was staying and was subsequently transferred to another facility to undergo MRI. This confirmed a small acute stroke. His only deficit at the time was speech difficulty. This has gradually improved though is not back to his baseline. He denies other deficits.  Jeffrey [name] denies a history of prior stroke and other cardiovascular disease. He has not had any chest pain, shortness of breath, palpitations, lightheadedness, orthopnea, PND, or edema. He continues to deal with sinus problems, having recently completed courses of prednisone and antibiotics.  Over the last 2 weeks, Jeffrey [name] has been monitoring his blood pressure, heart rate, and blood sugar at home. Blood pressures have ranged from 117-161/55-84. Heart rate is typically in the 50s or low 60s. Blood sugar has been somewhat elevated over the last week, albeit the setting of recent corticosteroid use. Due to elevated blood sugar at his recent PCP visit, hydrochlorothiazide was added to Jeffrey [name]'s blood pressure regimen.  --------------------------------------------------------------------------------------------------  Cardiovascular History & Procedures: Cardiovascular  Problems:  Cryptogenic stroke  Risk Factors:  Hypertension, hyperlipidemia, male gender, and age greater than 53  Cath/PCI:  None  CV Surgery:  None  EP Procedures and Devices:  24-hour Holter monitor (10/20/16, [place]): Predominant rhythm was sinus with an average rate of 63 bpm (range 46-93 bpm). Rare supraventricular and ventricular ectopy was identified. There was one episode of nonsustained ventricular tachycardia lasting 4 beats. No sustained arrhythmias, including atrial fibrillation/flutter) are evident.  Non-Invasive Evaluation(s):  Transthoracic echocardiogram (10/2016; [place]): Reportedly normal, though no images or reports is available for review.  Recent CV Pertinent Labs: Lab Results  Component Value Date   CHOL 148 01/12/2016   HDL 51.50 01/12/2016   LDLCALC 65 01/12/2016   LDLDIRECT 55.0 02/10/2015   TRIG 157.0 (H) 01/12/2016   CHOLHDL 3 01/12/2016   K 3.8 04/27/2016   K 3.9 05/15/2009   BUN 12 04/27/2016   BUN 12 05/15/2009   CREATININE 0.74 04/27/2016   CREATININE 0.8 05/15/2009    --------------------------------------------------------------------------------------------------  Past Medical History:  Diagnosis Date  . DDD (degenerative disc disease)    with epidural injection  . Gastritis    H pylori, partial tx.  EGD 04/2002  . Gout   . History of skin cancer   . Hyperlipidemia   . Hypertension    some white coat component- better at home  . Stroke [facility])     Past Surgical History:  Procedure Laterality Date  . [place]    Outpatient Encounter Prescriptions as of 11/24/2016  Medication Sig  . allopurinol (ZYLOPRIM) 300 MG tablet TAKE 1 TABLET(300 MG) BY MOUTH DAILY  . Ascorbic Acid (VITAMIN C) 1000 MG tablet Take 1,000 mg by mouth daily.    Marland Kitchen aspirin 325 MG tablet Take 325 mg  by mouth daily.  Marland Kitchen atorvastatin (LIPITOR) 20 MG tablet Take 1 tablet (20 mg total) by mouth daily.  . bisoprolol (ZEBETA) 5 MG tablet Take  1.5 tablets (7.5 mg total) by mouth daily.  Marland Kitchen CALCIUM PO Take by mouth daily.    . Calcium-Magnesium-Zinc 1000-400-15 MG TABS Take 1 tablet by mouth daily.    . hydrochlorothiazide (HYDRODIURIL) 12.5 MG tablet Take 1 tablet (12.5 mg total) by mouth daily.  Marland Kitchen lisinopril (PRINIVIL,ZESTRIL) 20 MG tablet TAKE 1 TABLET(20 MG) BY MOUTH DAILY  . Omega-3 Fatty Acids (FISH OIL PO) Take one capsule by mouth once a day   . VITAMIN E PO Take by mouth daily.    . [DISCONTINUED] predniSONE (DELTASONE) 20 MG tablet 2 tablets by mouth daily x 4 days, 1 tab by mouth daily x 3 days and stop. (Patient not taking: Reported on 11/24/2016)   No facility-administered encounter medications on file as of 11/24/2016.     Allergies: Patient has no known allergies.  Social History   Social History  . Marital status: Married    Spouse name: N/A  . Number of children: 3  . Years of education: N/A   Occupational History  . [employer]    Social History Main Topics  . Smoking status: Former Smoker    Packs/day: 1.00    Years: 8.00    Types: Cigarettes    Quit date: 1973  . Smokeless tobacco: Never Used  . Alcohol use 4.2 oz/week    7 Glasses of wine per week  . Drug use: No  . Sexual activity: No   Other Topics Concern  . Not on file   Social History Narrative   From [place]    Family History  Problem Relation Age of Onset  . Other Mother     brain tumor  . Skin cancer Mother     suspect melanoma  . Heart attack Father 24  . Colon cancer Sister     Review of Systems: A 12-system review of systems was performed and was negative except as noted in the HPI.  --------------------------------------------------------------------------------------------------  Physical Exam: BP (!) 148/89 (BP Location: Left Arm, Patient Position: Sitting, Cuff Size: Normal)   Pulse (!) 51   Ht 6' (1.829 m)   Wt 180 lb 8 oz (81.9 kg)   BMI 24.48 kg/m   General:  Well-developed, well-nourished  man seated comfortably in the exam room. HEENT: No conjunctival pallor or scleral icterus.  Moist mucous membranes.  OP clear. Neck: Supple without lymphadenopathy, thyromegaly, JVD, or HJR.  No carotid bruit. Lungs: Normal work of breathing.  Clear to auscultation bilaterally without wheezes or crackles. Heart: Bradycardic and regular without murmurs, rubs, or gallops.  Non-displaced PMI. Abd: Bowel sounds present.  Soft, NT/ND without hepatosplenomegaly Ext: No lower extremity edema.  Radial, PT, and DP pulses are 2+ bilaterally Skin: warm and dry without rash Neuro: CNIII-XII intact.  Slight speech disturbance noted with subtle lisp. Strength and fine-touch sensation intact in upper and lower extremities bilaterally. Psych: Normal mood and affect.  EKG:  Sinus bradycardia (rate 51 bpm) with no significant abnormalities. No prior tracing available for comparison.  Lab Results  Component Value Date   WBC 11.1 (H) 04/27/2016   HGB 13.8 04/27/2016   HCT 41.0 04/27/2016   MCV 97.2 04/27/2016   PLT 96.0 (L) 04/27/2016    Lab Results  Component Value Date   NA 139 04/27/2016   K 3.8 04/27/2016   CL 104  04/27/2016   CO2 29 04/27/2016   BUN 12 04/27/2016   CREATININE 0.74 04/27/2016   GLUCOSE 150 (H) 04/27/2016   ALT 21 04/27/2016    Lab Results  Component Value Date   CHOL 148 01/12/2016   HDL 51.50 01/12/2016   LDLCALC 65 01/12/2016   LDLDIRECT 55.0 02/10/2015   TRIG 157.0 (H) 01/12/2016   CHOLHDL 3 01/12/2016    --------------------------------------------------------------------------------------------------  ASSESSMENT AND PLAN: Cryptogenic stroke Per Jeffrey [name]'s report and Dr. Clydene Fake note, a clear cause of the patient's acute CVA in 10/2016 has not been identified. His risk factors include hypertension, hyperlipidemia, and age. We have discussed potential mechanisms for stroke, including paroxysmal arrhythmias such as atrial fibrillation as well as paradoxical emboli  from right to left intracardiac shunt. As we do not have records regarding echocardiogram performed in [place], it would be reasonable to repeat a transthoracic echocardiogram to exclude obvious structural abnormality predisposing to stroke. I will touch base with Dr. Leonie Man to see if he is in agreement with this. If transthoracic echo is unrevealing, we will proceed with transesophageal echocardiogram and implantable loop recorder placement (if TEE is negative). In the meantime, Jeffrey [name] should continue with aspirin, atorvastatin, and blood pressure control.  Hypertension Pressure is suboptimally controlled today. Review of his home readings is notable for frequent upper normal to mildly elevated blood pressure. Heart rate is also fairly low today, consistent with Holter monitor in [place] and home checks. We have agreed to decrease bisoprolol to 5 mg daily and increase hydrochlorothiazide to 25 mg daily. We will check a basic metabolic panel today to ensure that renal function and electrolytes are stable, given recent addition of HCTZ.  Hyperlipidemia Patient to continue statin therapy. We will defer ongoing management to Dr. Glori Bickers.  Follow-up: Return to clinic in 3 months.  Nelva Bush, MD 11/24/2016 3:27 PM

## 2016-12-21 NOTE — Progress Notes (Signed)
Subjective:    Patient ID: Jeffrey [name] is a 76 y.o. male.  HPI     Star Age, MD, PhD [facility] [address] P.O. [place], [place] [address]  Dear Mamie Nick,   I saw your patient, Jeffrey [name], upon your kind request in my clinic today for initial consultation of his sleep disorder, in particular, concern for underlying obstructive sleep apnea. The patient is unaccompanied today. As you know, Jeffrey [name] is a 76 year old right-handed gentleman with an underlying medical history of hypertension, hyperlipidemia, gout, gastritis, skin cancer, degenerative disc disease, stroke in January 2018, [place], who reports snoring and witnessed apneas per wife's report. I reviewed your office note from 11/10/2016. In January, he had an episode of slurring of speech, went to ER in [place], had neg. CTH per his report, transferred to another facility and was diagnosed via MRI brain with "small stroke".  He is in outpatient ST at neuro rehab, feeling better with his speech.  His Epworth sleepiness score is 4 out of 24, his fatigue score is 18 out of 63. He quit smoking in 1972. He lives at home with his wife. He has 3 grown children. He drinks alcohol, 1-2 per day, he drinks caffeine, one cup of coffee per day, decaf tea and no sodas. He denies illicit drug use. He usually goes to bed around 10:30 to 11 PM. Wakeup time is between 7:30 and 8. He works part time, he owns his own company. He denies restless leg symptoms or leg twitching at night. He does not watch TV in bed. He likes to read before falling asleep. He denies morning headaches, he has nocturia about twice per average night. He has a cousin with sleep apnea. Of note, he had a TEE today, he also had a loop recorder implanted today. Weight has been stable. He had a tonsillectomy as a child.  His Past Medical History Is Significant For: Past Medical History:  Diagnosis Date  . DDD  (degenerative disc disease)    with epidural injection  . Gastritis    H pylori, partial tx.  EGD 04/2002  . Gout   . History of skin cancer   . Hyperlipidemia   . Hypertension    some white coat component- better at home  . Stroke [facility])     His Past Surgical History Is Significant For: Past Surgical History:  Procedure Laterality Date  . [place]  . LOOP RECORDER INSERTION N/A 12/21/2016   Procedure: Loop Recorder Insertion;  Surgeon: Deboraha Sprang, MD;  Location: [facility];  Service: Cardiovascular;  Laterality: N/A;    His Family History Is Significant For: Family History  Problem Relation Age of Onset  . Other Mother     brain tumor  . Skin cancer Mother     suspect melanoma  . Heart attack Father 81  . Colon cancer Sister     His Social History Is Significant For: Social History   Social History  . Marital status: Married    Spouse name: N/A  . Number of children: 3  . Years of education: HS   Occupational History  . [employer]    Social History Main Topics  . Smoking status: Former Smoker    Packs/day: 1.00    Years: 8.00    Types: Cigarettes    Quit date: 1973  . Smokeless tobacco: Never Used  . Alcohol use 4.2  oz/week    7 Glasses of wine per week  . Drug use: No  . Sexual activity: No   Other Topics Concern  . None   Social History Narrative   From [place]   Drinks 1 caffeine drink a day     His Allergies Are:  No Known Allergies:   His Current Medications Are:  Outpatient Encounter Prescriptions as of 12/21/2016  Medication Sig  . allopurinol (ZYLOPRIM) 300 MG tablet TAKE 1 TABLET(300 MG) BY MOUTH DAILY (Patient taking differently: TAKE 1 TABLET(300 MG) BY MOUTH DAILY AT NIGHT)  . Ascorbic Acid (VITAMIN C) 1000 MG tablet Take 1,000 mg by mouth daily.    Marland Kitchen aspirin EC 81 MG tablet Take 162 mg by mouth 2 (two) times daily.  Marland Kitchen atorvastatin (LIPITOR) 20 MG tablet Take 1 tablet (20 mg total) by mouth  daily.  . bisoprolol (ZEBETA) 5 MG tablet Take 1 tablet (5 mg total) by mouth daily.  Marland Kitchen CALCIUM PO Take 1 tablet by mouth daily.   . Calcium-Magnesium-Zinc 1000-400-15 MG TABS Take 1 tablet by mouth daily.    . hydrochlorothiazide (HYDRODIURIL) 12.5 MG tablet Take 12.5 mg by mouth 2 (two) times daily.  Marland Kitchen lisinopril (PRINIVIL,ZESTRIL) 20 MG tablet TAKE 1 TABLET(20 MG) BY MOUTH DAILY (Patient taking differently: TAKE 1 TABLET(20 MG) BY MOUTH DAILY AT NIGHT)  . Omega-3 Fatty Acids (FISH OIL PO) Take 1 capsule by mouth daily.   Marland Kitchen VITAMIN E PO Take 1 capsule by mouth daily.   . [DISCONTINUED] aspirin 325 MG tablet Take 650 mg by mouth 2 (two) times daily.   . [DISCONTINUED] hydrochlorothiazide (HYDRODIURIL) 25 MG tablet Take 1 tablet (25 mg total) by mouth daily. (Patient not taking: Reported on 12/09/2016)  . [DISCONTINUED] butamben-tetracaine-benzocaine (CETACAINE) 11-12-12 % spray   . [DISCONTINUED] fentaNYL (SUBLIMAZE) 100 MCG/2ML injection   . [DISCONTINUED] lidocaine (XYLOCAINE) 2 % viscous mouth solution   . [DISCONTINUED] midazolam (VERSED) 5 MG/5ML injection   . [DISCONTINUED] sodium chloride flush 0.9 % injection    No facility-administered encounter medications on file as of 12/21/2016.   :  Review of Systems:  Out of a complete 14 point review of systems, all are reviewed and negative with the exception of these symptoms as listed below:  Review of Systems  Neurological:       Patient has never had a sleep study. Snores and witnessed apnea, denies taking naps.    Epworth Sleepiness Scale 0= would never doze 1= slight chance of dozing 2= moderate chance of dozing 3= high chance of dozing  Sitting and reading:1 Watching TV:2 Sitting inactive in a public place (ex. Theater or meeting):0 As a passenger in a car for an hour without a break:0 Lying down to rest in the afternoon:1 Sitting and talking to someone:0 Sitting quietly after lunch (no alcohol):0 In a car, while stopped in  traffic:0 Total:4   Objective:  Neurologic Exam  Physical Exam Physical Examination:   Vitals:   12/21/16 1331  BP: 130/72  Pulse: 70  Resp: 16    General Examination: The patient is a very pleasant 76 y.o. male in no acute distress. He appears well-developed and well-nourished and very well groomed.   HEENT: Normocephalic, atraumatic, pupils are equal, round and reactive to light and accommodation. Funduscopic exam is normal with sharp disc margins noted. Extraocular tracking is good without limitation to gaze excursion or nystagmus noted. Normal smooth pursuit is noted. Hearing is grossly intact. Tympanic membranes are clear bilaterally. Face is  symmetric with normal facial animation and normal facial sensation. Speech is clear with no dysarthria noted. There is no hypophonia. There is no lip, neck/head, jaw or voice tremor. Neck is supple with full range of passive and active motion. There are no carotid bruits on auscultation. Oropharynx exam reveals: mild mouth dryness, adequate dental hygiene and mild airway crowding, due to redundant soft palate and smaller airway entry. Mallampati is class II. Tongue protrudes centrally and palate elevates symmetrically. Tonsils are absent. Neck size is 15.5 inches. He has a Moderate overbite. Nasal inspection reveals no significant nasal mucosal bogginess or redness but significant septal deviation to the R.   Chest: Clear to auscultation without wheezing, rhonchi or crackles noted.  Heart: S1+S2+0, regular and normal without murmurs, rubs or gallops noted.   Abdomen: Soft, non-tender and non-distended with normal bowel sounds appreciated on auscultation.  Extremities: There is no pitting edema in the distal lower extremities bilaterally. Pedal pulses are intact.  Skin: Warm and dry without trophic changes noted.  Musculoskeletal: exam reveals no obvious joint deformities, tenderness or joint swelling or erythema.   Neurologically:  Mental  status: The patient is awake, alert and oriented in all 4 spheres. His immediate and remote memory, attention, language skills and fund of knowledge are appropriate. There is no evidence of aphasia, agnosia, apraxia or anomia. Speech is clear with normal prosody and enunciation. Thought process is linear. Mood is normal and affect is normal.  Cranial nerves II - XII are as described above under HEENT exam. In addition: shoulder shrug is normal with equal shoulder height noted. Motor exam: Normal bulk, strength and tone is noted. There is no drift, tremor or rebound. Romberg is negative. Reflexes are 2+ throughout. Fine motor skills and coordination: intact with normal finger taps, normal hand movements, normal rapid alternating patting, normal foot taps and normal foot agility.  Cerebellar testing: No dysmetria or intention tremor on finger to nose testing. Heel to shin is unremarkable bilaterally. There is no truncal or gait ataxia.  Sensory exam: intact to light touch, pinprick, vibration, and temperature and in the upper and lower extremities.  Gait, station and balance: He stands easily. No veering to one side is noted. No leaning to one side is noted. Posture is age-appropriate and stance is narrow based. Gait shows normal stride length and normal pace. No problems turning are noted. Tandem walk is difficult for him in the beginning.   Assessment and plan:   In summary, Jeffrey [name] is a very pleasant 76 y.o.-year old male with an underlying medical history of hypertension, hyperlipidemia, gout, gastritis, skin cancer, degenerative disc disease, stroke in January 2018, [place] country, whose history and physical exam are concerning for obstructive sleep apnea (OSA), In that he reports snoring, witnessed apneic pauses while asleep, nocturia and in light of his history of stroke within the last 3 months, sleep apnea should be excluded and treated if found. I had a long chat with the patient  about my findings and the diagnosis of OSA, its prognosis and treatment options. We talked about medical treatments, surgical interventions and non-pharmacological approaches. I explained in particular the risks and ramifications of untreated moderate to severe OSA, especially with respect to developing cardiovascular disease down the Road, including congestive heart failure, difficult to treat hypertension, cardiac arrhythmias, or stroke. Even type 2 diabetes has, in part, been linked to untreated OSA. Symptoms of untreated OSA include daytime sleepiness, memory problems, mood irritability and mood disorder such  as depression and anxiety, lack of energy, as well as recurrent headaches, especially morning headaches. We talked about trying to maintain a healthy lifestyle in general, as well as the importance of weight control. I encouraged the patient to eat healthy, exercise daily and keep well hydrated, to keep a scheduled bedtime and wake time routine, to not skip any meals and eat healthy snacks in between meals. I advised the patient not to drive when feeling sleepy. I recommended the following at this time: sleep study with potential positive airway pressure titration. (We will score hypopneas at 4%).   I explained the sleep test procedure to the patient and also outlined possible surgical and non-surgical treatment options of OSA, including the use of a custom-made dental device (which would require a referral to a specialist dentist or oral surgeon), upper airway surgical options, such as pillar implants, radiofrequency surgery, tongue base surgery, and UPPP (which would involve a referral to an ENT surgeon). Rarely, jaw surgery such as mandibular advancement may be considered.  I also explained the CPAP treatment option to the patient, who indicated that he would be willing to try CPAP if the need arises. I explained the importance of being compliant with PAP treatment, not only for insurance purposes  but primarily to improve His symptoms, and for the patient's long term health benefit, including to reduce His cardiovascular risks. I answered all his questions today and the patient was in agreement. I would like to see him back after the sleep study is completed and encouraged him to call with any interim questions, concerns, problems or updates.   Thank you very much for allowing me to participate in the care of this nice patient. If I can be of any further assistance to you please do not hesitate to talk to me.

## 2016-12-21 NOTE — Progress Notes (Signed)
Pt procedures TEE/LOOP recorder placement both done at this time.Both Dr Caryl Comes and Dr End have seen this patient with questions answered regarding both procedures and with wife. Loop Recorder instructions given to wife and patient per Dannial Monarch. Orders given per Dr ENd for patient to be discharged at this time. Discharge instructions given to patient and wife with questions answered.

## 2016-12-21 NOTE — Patient Instructions (Signed)

## 2016-12-21 NOTE — CV Procedure (Signed)
    Transesophageal Echocardiogram Note  Jeffrey [name] [id number] [birth year]  Procedure: Transesophageal Echocardiogram Indications: 76 y/o man with recent stroke without clear etiology and transthoracic echocardiogram demonstrating mild MR, moderate AI, and borderline dilated aortic root.  Procedure Details Consent: Obtained Time Out: Verified patient identification, verified procedure, site/side was marked, verified correct patient position, special equipment/implants available, Radiology Safety Procedures followed,  medications/allergies/relevent history reviewed, required imaging and test results available.  Performed  Medications:  During this procedure the patient is administered a total of Versed 4 mg and Fentanyl 50 mcg  to achieve and maintain moderate conscious sedation.  The patient's heart rate, blood pressure, and oxygen saturation are monitored continuously during the procedure. The period of conscious sedation is 20 minutes, of which I was present face-to-face 100% of this time.  Left Ventrical:  Normal size and contraction  Mitral Valve: Normal appearance with mild regurgitation  Aortic Valve: Trileaflet with normal appearance. Mild regurgitation.  Tricuspid Valve: Normal appearance. Trivial regurgitation.  Pulmonic Valve: Normal appearance. Mild regurgitation.  Left Atrium/ Left atrial appendage: No thrombus.  Atrial septum: Intact without ASD/PFO; negative bubble study.  Aorta: Mildly dilated ascending aorta. Mild plaquing of descending aorta.   Complications: No apparent complications Patient did tolerate procedure well.   Nelva Bush, MD 12/21/2016, 7:55 AM

## 2016-12-21 NOTE — Progress Notes (Signed)
*  PRELIMINARY RESULTS* Echocardiogram Echocardiogram Transesophageal has been performed.  Sherrie Sport 12/21/2016, 8:06 AM

## 2016-12-21 NOTE — Discharge Instructions (Signed)
Transesophageal Echocardiogram Transesophageal echocardiography (TEE) is a picture test of your heart using sound waves. The pictures taken can give very detailed pictures of your heart. This can help your doctor see if there are problems with your heart. TEE can check:  If your heart has blood clots in it.  How well your heart valves are working.  If you have an infection on the inside of your heart.  Some of the major arteries of your heart.  If your heart valve is working after a Office manager.  Your heart before a procedure that uses a shock to your heart to get the rhythm back to normal. What happens before the procedure?  Do not eat or drink for 6 hours before the procedure or as told by your doctor.  Make plans to have someone drive you home after the procedure. Do not drive yourself home.  An IV tube will be put in your arm. What happens during the procedure?  You will be given a medicine to help you relax (sedative). It will be given through the IV tube.  A numbing medicine will be sprayed or gargled in the back of your throat to help numb it.  The tip of the probe is placed into the back of your mouth. You will be asked to swallow. This helps to pass the probe into your esophagus.  Once the tip of the probe is in the right place, your doctor can take pictures of your heart.  You may feel pressure at the back of your throat. What happens after the procedure?  You will be taken to a recovery area so the sedative can wear off.  Your throat may be sore and scratchy. This will go away slowly over time.  You will go home when you are fully awake and able to swallow liquids.  You should have someone stay with you for the next 24 hours.  Do not drive or operate machinery for the next 24 hours. This information is not intended to replace advice given to you by your health care provider. Make sure you discuss any questions you have with your health care provider. Document  Released: 07/25/2009 Document Revised: 03/04/2016 Document Reviewed: 03/29/2013 Elsevier Interactive Patient Education  2017 [place] Placement, Care After Refer to this sheet in the next few weeks. These instructions provide you with information about caring for yourself after your procedure. Your health care provider may also give you more specific instructions. Your treatment has been planned according to current medical practices, but problems sometimes occur. Call your health care provider if you have any problems or questions after your procedure. What can I expect after the procedure? After the procedure, it is common to have:  Soreness or pain near the cut from surgery (incision).  Some swelling or bruising near the incision. Follow these instructions at home: Medicines   Take over-the-counter and prescription medicines only as told by your health care provider.  If you were prescribed an antibiotic medicine, take it as told by your health care provider. Do not stop taking the antibiotic even if you start to feel better. Bathing   Do not take baths, swim, or use a hot tub until your health care provider approves. Ask your health care provider if you can take showers. You may only be allowed to take sponge baths for bathing. Incision care   Follow instructions from your health care provider about how to take care of your incision. Make sure you:  Wash your hands with soap and water before you change your bandage (dressing). If soap and water are not available, use hand sanitizer.  Change your dressing as told by your health care provider.  Keep your dressing dry.  Leave stitches (sutures), skin glue, or adhesive strips in place. These skin closures may need to stay in place for 2 weeks or longer. If adhesive strip edges start to loosen and curl up, you may trim the loose edges. Do not remove adhesive strips completely unless your health care  provider tells you to do that.  Check your incision area every day for signs of infection. Check for:  More redness, swelling, or pain.  Fluid or blood.  Warmth.  Pus or a bad smell. Driving   If you received a sedative, do not drive for 24 hours after the procedure.  If you did not receive a sedative, ask your health care provider when it is safe to drive. Activity   Return to your normal activities as told by your health care provider. Ask your health care provider what activities are safe for you.  Until your health care provider says it is safe:  Do not lift anything that is heavier than 10 lb (4.5 kg).  Do not do activities that involve lifting your arms over your head. General instructions    Follow instructions from your health care provider about how and when to use your implantable loop recorder.  Do not go through a metal detection gate, and do not let someone hold a metal detector over your chest. Show your ID card.  Do not have an MRI unless you check with your health care provider first.  Do not use any tobacco products, such as cigarettes, chewing tobacco, and e-cigarettes. Tobacco can delay healing. If you need help quitting, ask your health care provider.  Keep all follow-up visits as told by your health care provider. This is important. Contact a health care provider if:  You have more redness, swelling, or pain around your incision.  You have more fluid or blood coming from your incision.  Your incision feels warm to the touch.  You have pus or a bad smell coming from your incision.  You have a fever.  You have pain that is not relieved by your pain medicine.  You have triggered your device because of fainting (syncope) or because of a heartbeat that feels like it is racing, slow, fluttering, or skipping (palpitations). Get help right away if:  You have chest pain.  You have difficulty breathing. This information is not intended to replace  advice given to you by your health care provider. Make sure you discuss any questions you have with your health care provider. Document Released: 09/08/2015 Document Revised: 03/04/2016 Document Reviewed: 07/02/2015 Elsevier Interactive Patient Education  2017 [employer].

## 2016-12-21 NOTE — Interval H&P Note (Signed)
History and Physical Interval Note:  12/21/2016 7:07 AM  Jeffrey [name]  has presented today for transesophageal echocardiogram, with the diagnosis of cryptogenic stroke. The various methods of treatment have been discussed with the patient and family. After consideration of risks, benefits and other options for treatment, the patient has consented to  Procedure(s): TRANSESOPHAGEAL ECHOCARDIOGRAM (TEE) (N/A) as a surgical intervention .  The patient's history has been reviewed, patient examined, no change in status, stable for surgery.  I have reviewed the patient's chart and labs.  Questions were answered to the patient's satisfaction.    [name]

## 2016-12-22 ENCOUNTER — Encounter: Payer: Self-pay | Admitting: Internal Medicine

## 2016-12-22 ENCOUNTER — Telehealth: Payer: Self-pay | Admitting: Internal Medicine

## 2016-12-22 NOTE — Telephone Encounter (Signed)
New Message   Per pt he had a loop recorder placed, and said he is experiencing some bleeding. Requesting a call back

## 2016-12-22 NOTE — Telephone Encounter (Signed)
[name] reports that he had a LINQ implanted yesterday morning and yesterday afternoon about 4:30 he noticed bleeding from insertion site. He applied pressure and likely stopped bleeding but he is concerned about the dressing being saturated with blood. I asked him to remove the outer dressing and assess if the incision is actively bleeding but to leave steri-strips in place. He prefers to do this when we get off the phone. I instructed him to apply pressure directly over incision if he noticed bleeding and to call back if he had trouble stopping bleeding. He verbalizes understanding and has the direct # to [facility]. He is leaving for [place] this Friday and won't return until 01/10/17. I scheduled him for wound check 01/13/17 with [facility].

## 2017-01-02 ENCOUNTER — Telehealth: Payer: Self-pay | Admitting: Family Medicine

## 2017-01-02 DIAGNOSIS — Z0000 Encounter for general adult medical examination without abnormal findings: Secondary | ICD-10-CM

## 2017-01-02 DIAGNOSIS — R739 Hyperglycemia, unspecified: Secondary | ICD-10-CM

## 2017-01-02 DIAGNOSIS — M109 Gout, unspecified: Secondary | ICD-10-CM

## 2017-01-02 DIAGNOSIS — R972 Elevated prostate specific antigen [PSA]: Secondary | ICD-10-CM

## 2017-01-02 NOTE — Telephone Encounter (Signed)
-----   Message from Ellamae Sia sent at 12/31/2016 11:20 AM EDT ----- Regarding: Lab orders for Monday April 2, 18  AWV lab orders, please.

## 2017-01-10 ENCOUNTER — Other Ambulatory Visit (INDEPENDENT_AMBULATORY_CARE_PROVIDER_SITE_OTHER): Payer: PPO

## 2017-01-10 ENCOUNTER — Other Ambulatory Visit: Payer: PPO

## 2017-01-10 ENCOUNTER — Ambulatory Visit: Payer: PPO

## 2017-01-10 DIAGNOSIS — R739 Hyperglycemia, unspecified: Secondary | ICD-10-CM | POA: Diagnosis not present

## 2017-01-10 DIAGNOSIS — Z0000 Encounter for general adult medical examination without abnormal findings: Secondary | ICD-10-CM | POA: Diagnosis not present

## 2017-01-10 LAB — LIPID PANEL
Cholesterol: 101 mg/dL (ref 0–200)
HDL: 38 mg/dL — AB (ref >39.00)
LDL Cholesterol: 25 mg/dL (ref 0–99)
NonHDL: 62.7
TRIGLYCERIDES: 189 mg/dL — AB (ref 0.0–149.0)
Total CHOL/HDL Ratio: 3
VLDL: 37.8 mg/dL (ref 0.0–40.0)

## 2017-01-10 LAB — CBC WITH DIFFERENTIAL/PLATELET
BASOS ABS: 0.1 10*3/uL (ref 0.0–0.1)
BASOS PCT: 1.1 % (ref 0.0–3.0)
EOS ABS: 0.5 10*3/uL (ref 0.0–0.7)
Eosinophils Relative: 8 % — ABNORMAL HIGH (ref 0.0–5.0)
HCT: 42.2 % (ref 39.0–52.0)
Hemoglobin: 14.3 g/dL (ref 13.0–17.0)
LYMPHS PCT: 29 % (ref 12.0–46.0)
Lymphs Abs: 1.9 10*3/uL (ref 0.7–4.0)
MCHC: 33.9 g/dL (ref 30.0–36.0)
MCV: 94.9 fl (ref 78.0–100.0)
Monocytes Absolute: 0.6 10*3/uL (ref 0.1–1.0)
Monocytes Relative: 9.5 % (ref 3.0–12.0)
Neutro Abs: 3.4 10*3/uL (ref 1.4–7.7)
Neutrophils Relative %: 52.4 % (ref 43.0–77.0)
Platelets: 152 10*3/uL (ref 150.0–400.0)
RBC: 4.44 Mil/uL (ref 4.22–5.81)
RDW: 13.2 % (ref 11.5–15.5)
WBC: 6.4 10*3/uL (ref 4.0–10.5)

## 2017-01-10 LAB — COMPREHENSIVE METABOLIC PANEL
ALBUMIN: 4.1 g/dL (ref 3.5–5.2)
ALT: 33 U/L (ref 0–53)
AST: 28 U/L (ref 0–37)
Alkaline Phosphatase: 49 U/L (ref 39–117)
BUN: 18 mg/dL (ref 6–23)
CALCIUM: 9 mg/dL (ref 8.4–10.5)
CHLORIDE: 101 meq/L (ref 96–112)
CO2: 32 meq/L (ref 19–32)
Creatinine, Ser: 0.8 mg/dL (ref 0.40–1.50)
GFR: 100.52 mL/min (ref >60.00)
Glucose, Bld: 132 mg/dL — ABNORMAL HIGH (ref 70–99)
POTASSIUM: 3.8 meq/L (ref 3.5–5.1)
Sodium: 141 mEq/L (ref 135–145)
Total Bilirubin: 0.4 mg/dL (ref 0.2–1.2)
Total Protein: 6.2 g/dL (ref 6.0–8.3)

## 2017-01-10 LAB — HEMOGLOBIN A1C: Hgb A1c MFr Bld: 6.3 % (ref 4.6–6.5)

## 2017-01-10 LAB — TSH: TSH: 2.36 u[IU]/mL (ref 0.35–4.50)

## 2017-01-13 ENCOUNTER — Ambulatory Visit: Payer: PPO

## 2017-01-17 ENCOUNTER — Ambulatory Visit (INDEPENDENT_AMBULATORY_CARE_PROVIDER_SITE_OTHER): Payer: PPO | Admitting: Family Medicine

## 2017-01-17 ENCOUNTER — Encounter: Payer: Self-pay | Admitting: Family Medicine

## 2017-01-17 VITALS — BP 130/80 | HR 54 | Temp 97.9°F | Ht 71.5 in | Wt 182.0 lb

## 2017-01-17 DIAGNOSIS — Z0000 Encounter for general adult medical examination without abnormal findings: Secondary | ICD-10-CM | POA: Diagnosis not present

## 2017-01-17 DIAGNOSIS — I10 Essential (primary) hypertension: Secondary | ICD-10-CM | POA: Diagnosis not present

## 2017-01-17 DIAGNOSIS — E7800 Pure hypercholesterolemia, unspecified: Secondary | ICD-10-CM | POA: Diagnosis not present

## 2017-01-17 DIAGNOSIS — R739 Hyperglycemia, unspecified: Secondary | ICD-10-CM | POA: Diagnosis not present

## 2017-01-17 DIAGNOSIS — I639 Cerebral infarction, unspecified: Secondary | ICD-10-CM

## 2017-01-17 DIAGNOSIS — D696 Thrombocytopenia, unspecified: Secondary | ICD-10-CM | POA: Diagnosis not present

## 2017-01-17 DIAGNOSIS — R972 Elevated prostate specific antigen [PSA]: Secondary | ICD-10-CM | POA: Diagnosis not present

## 2017-01-17 LAB — PSA, MEDICARE: PSA: 1.07 ng/ml (ref 0.10–4.00)

## 2017-01-17 MED ORDER — ALLOPURINOL 300 MG PO TABS: ORAL_TABLET | ORAL | 3 refills | Status: DC

## 2017-01-17 MED ORDER — HYDROCHLOROTHIAZIDE 12.5 MG PO TABS: 12.5000 mg | ORAL_TABLET | Freq: Two times a day (BID) | ORAL | 11 refills | Status: DC

## 2017-01-17 NOTE — Assessment & Plan Note (Signed)
Lab Results  Component Value Date   HGBA1C 6.3 01/10/2017   disc imp of low glycemic diet and wt loss to prevent DM2 Will watch this  Unsure if statin contributes slightly

## 2017-01-17 NOTE — Assessment & Plan Note (Signed)
Disc goals for lipids and reasons to control them Rev labs with pt Rev low sat fat diet in detail Well controlled in the setting of stroke hx  HDL is low - expect imp with exercise Trig up with hyperglycemia-disc this

## 2017-01-17 NOTE — Patient Instructions (Addendum)
If you are interested in a shingles/zoster vaccine (we are waiting to get the new shingles vaccine that comes in 2 parts) - call your insurance to check on coverage,( you should not get it within 1 month of other vaccines) , then call us for a prescription  for it to take to a pharmacy that gives the shot , or make a nurse visit to get it here depending on your coverage  You blood sugar is in the borderline/ pre diabetic range  Try to get most of your carbs from fruits and vegetables  Less from bread/ white potatoes and rice and pasta  Also steer clear from sweets / sweet drinks and juices   We will watch this over time   We will check your PSA today   The brand of blood pressure meter I like is called OMRON -for the arm  Always check when relaxed and arm at heart level

## 2017-01-17 NOTE — Assessment & Plan Note (Signed)
TEE nl  Under care of neurology On asa  bp controlled  Has loop recorder  Sleep study planned No cause found thus far

## 2017-01-17 NOTE — Progress Notes (Signed)
Pre visit review using our clinic review tool, if applicable. No additional management support is needed unless otherwise documented below in the visit note. 

## 2017-01-17 NOTE — Assessment & Plan Note (Signed)
bp in fair control at this time  BP Readings from Last 1 Encounters:  01/17/17 130/80   No changes needed Disc lifstyle change with low sodium diet and exercise  Labs reviewed

## 2017-01-17 NOTE — Assessment & Plan Note (Signed)
Nl platelet ct of 152 this check

## 2017-01-17 NOTE — Progress Notes (Signed)
Subjective:    Patient ID: Jeffrey [name], male    DOB: Apr 11, 1941, 76 y.o.   MRN: [id number]  HPI Here for health maintenance exam and to review chronic medical problems   Wt Readings from Last 3 Encounters:  01/17/17 182 lb (82.6 kg)  12/21/16 181 lb (82.1 kg)  12/21/16 181 lb (82.1 kg)  stable overall  He likes to walk and play tennis  bmi 25.0  Has AMW scheduled tomorrow  Colonoscopy 2/15- tubular adenoma -he thinks it is due this year - in 67 (31 y)  Sister had colon cancer   utd imms  Zoster vaccine- he is interested in new one if covered    Prostate status Lab Results  Component Value Date   PSA 7.37 (H) 07/21/2016   PSA 28.79 (H) 04/27/2016   PSA 1.08 01/12/2016   Has seen urology and also had prostatitis  No problems -no stream issues  He wants to get psa today while he is here- has not seen the urologist     bp is stable today  No cp or palpitations or headaches or edema  No side effects to medicines  BP Readings from Last 3 Encounters:  01/17/17 130/80  12/21/16 130/72  12/21/16 133/90    At home his bp is low - example 110/40  Today it was 160s/70s on with his cuff-not comparable to our reading   Hx of CVA On lipitor and ASA TEE was normal  Loop recorder-in now  Has a sleep study planned  Doing well with function and speech   Hx of thrombocytopenia Lab Results  Component Value Date   WBC 6.4 01/10/2017   HGB 14.3 01/10/2017   HCT 42.2 01/10/2017   MCV 94.9 01/10/2017   PLT 152.0 01/10/2017   nl today  Hx of hyperlipidemia Lab Results  Component Value Date   CHOL 101 01/10/2017   CHOL 148 01/12/2016   CHOL 163 02/10/2015   Lab Results  Component Value Date   HDL 38.00 (L) 01/10/2017   HDL 51.50 01/12/2016   HDL 47.80 02/10/2015   Lab Results  Component Value Date   LDLCALC 25 01/10/2017   LDLCALC 65 01/12/2016   LDLCALC 83 12/16/2010   Lab Results  Component Value Date   TRIG 189.0 (H) 01/10/2017   TRIG 157.0 (H)  01/12/2016   TRIG 354.0 (H) 02/10/2015   Lab Results  Component Value Date   CHOLHDL 3 01/10/2017   CHOLHDL 3 01/12/2016   CHOLHDL 3 02/10/2015   Lab Results  Component Value Date   LDLDIRECT 55.0 02/10/2015   LDLDIRECT 64.0 10/30/2014   LDLDIRECT 86.0 07/11/2013   Statin and diet   Hx of hyperglycemia Lab Results  Component Value Date   HGBA1C 6.3 01/10/2017   This is up - was in the 5s before  Wonders if it is from his cholesterol medicine No family hx of diabetes Diet is very good- vegetables / not a lot of meat  Eats chicken and eggs , fish  Nuts and nut butters and dairy for protein  He does eat some bread  Not a lot of other carbs  Not a lot of fruit   Hx of low platelets in the past Lab Results  Component Value Date   WBC 6.4 01/10/2017   HGB 14.3 01/10/2017   HCT 42.2 01/10/2017   MCV 94.9 01/10/2017   PLT 152.0 01/10/2017   Normal this check    Chemistry  Component Value Date/Time   NA 141 01/10/2017 0801   NA 142 05/15/2009 0819   K 3.8 01/10/2017 0801   K 3.9 05/15/2009 0819   CL 101 01/10/2017 0801   CL 103 05/15/2009 0819   CO2 32 01/10/2017 0801   CO2 28 05/15/2009 0819   BUN 18 01/10/2017 0801   BUN 12 05/15/2009 0819   CREATININE 0.80 01/10/2017 0801   CREATININE 0.8 05/15/2009 0819      Component Value Date/Time   CALCIUM 9.0 01/10/2017 0801   CALCIUM 8.7 05/15/2009 0819   ALKPHOS 49 01/10/2017 0801   ALKPHOS 40 05/15/2009 0819   AST 28 01/10/2017 0801   AST 44 (H) 05/15/2009 0819   ALT 33 01/10/2017 0801   ALT 53 (H) 05/15/2009 0819   BILITOT 0.4 01/10/2017 0801   BILITOT 0.50 05/15/2009 0819      Review of Systems Review of Systems  Constitutional: Negative for fever, appetite change, fatigue and unexpected weight change.  Eyes: Negative for pain and visual disturbance.  ENT pos for some congestion s/p uri - occ sinus pain  Respiratory: Negative for cough and shortness of breath.   Cardiovascular: Negative for cp or  palpitations    Gastrointestinal: Negative for nausea, diarrhea and constipation.  Genitourinary: Negative for urgency and frequency.  Skin: Negative for pallor or rash   Neurological: Negative for weakness, light-headedness, numbness and headaches. pos for speech slowing after a stroke that is much improved  Hematological: Negative for adenopathy. Does not bruise/bleed easily.  Psychiatric/Behavioral: Negative for dysphoric mood. The patient is not nervous/anxious.         Objective:   Physical Exam  Constitutional: He appears well-developed and well-nourished. No distress.  Well appearing   HENT:  Head: Normocephalic and atraumatic.  Right Ear: External ear normal.  Left Ear: External ear normal.  Nose: Nose normal.  Mouth/Throat: Oropharynx is clear and moist.  Eyes: Conjunctivae and EOM are normal. Pupils are equal, round, and reactive to light. Right eye exhibits no discharge. Left eye exhibits no discharge. No scleral icterus.  Neck: Normal range of motion. Neck supple. No JVD present. Carotid bruit is not present. No thyromegaly present.  Cardiovascular: Normal rate, regular rhythm, normal heart sounds and intact distal pulses.  Exam reveals no gallop.   Pulmonary/Chest: Effort normal and breath sounds normal. No respiratory distress. He has no wheezes. He exhibits no tenderness.  Abdominal: Soft. Bowel sounds are normal. He exhibits no distension, no abdominal bruit and no mass. There is no tenderness.  Musculoskeletal: He exhibits tenderness. He exhibits no edema.  Lymphadenopathy:    He has no cervical adenopathy.  Neurological: He is alert. He has normal reflexes. No cranial nerve deficit. He exhibits normal muscle tone. Coordination normal.  Speech continues to improve  Skin: Skin is warm and dry. No rash noted. No erythema. No pallor.  Lentigines and solar aging diffusely  Tanned   Psychiatric: He has a normal mood and affect.          Assessment & Plan:    Problem List Items Addressed This Visit      Cardiovascular and Mediastinum   Cryptogenic stroke ([place])    TEE nl  Under care of neurology On asa  bp controlled  Has loop recorder  Sleep study planned No cause found thus far       Essential hypertension - Primary    bp in fair control at this time  BP Readings from Last 1 Encounters:  01/17/17  130/80   No changes needed Disc lifstyle change with low sodium diet and exercise  Labs reviewed         Other   Hyperglycemia    Lab Results  Component Value Date   HGBA1C 6.3 01/10/2017   disc imp of low glycemic diet and wt loss to prevent DM2 Will watch this  Unsure if statin contributes slightly       Hyperlipidemia    Disc goals for lipids and reasons to control them Rev labs with pt Rev low sat fat diet in detail Well controlled in the setting of stroke hx  HDL is low - expect imp with exercise Trig up with hyperglycemia-disc this       PSA, INCREASED    Re check today No symptoms  Has not seen his urologist in the past year      Relevant Orders   PSA, Medicare (Completed)   Routine general medical examination at a health care facility    Reviewed health habits including diet and exercise and skin cancer prevention Reviewed appropriate screening tests for age  Also reviewed health mt list, fam hx and immunization status , as well as social and family history   See HPI Labs reviewed  PSA ordered today since he is not seeing a urologist  AMW visit planned for tomorrow  Due for colonoscopy in February  He will look into coverage for zoster vaccine        Thrombocytopenia ([place])    Nl platelet ct of 152 this check

## 2017-01-17 NOTE — Assessment & Plan Note (Addendum)
Re check today No symptoms  Has not seen his urologist in the past year

## 2017-01-17 NOTE — Assessment & Plan Note (Signed)
Reviewed health habits including diet and exercise and skin cancer prevention Reviewed appropriate screening tests for age  Also reviewed health mt list, fam hx and immunization status , as well as social and family history   See HPI Labs reviewed  PSA ordered today since he is not seeing a urologist  AMW visit planned for tomorrow  Due for colonoscopy in February  He will look into coverage for zoster vaccine

## 2017-01-18 ENCOUNTER — Ambulatory Visit (INDEPENDENT_AMBULATORY_CARE_PROVIDER_SITE_OTHER): Payer: PPO

## 2017-01-18 VITALS — BP 128/76 | HR 58 | Temp 98.1°F | Ht 72.0 in | Wt 183.5 lb

## 2017-01-18 DIAGNOSIS — Z0000 Encounter for general adult medical examination without abnormal findings: Secondary | ICD-10-CM

## 2017-01-18 NOTE — Progress Notes (Signed)
Subjective:   Jeffrey [name] is a 76 y.o. male who presents for Medicare Annual/Subsequent preventive examination.  Review of Systems:  N/A Cardiac Risk Factors include: advanced age (>37men, >34 women);male gender;dyslipidemia;hypertension     Objective:    Vitals: BP 128/76 (BP Location: Right Arm, Patient Position: Sitting, Cuff Size: Normal)   Pulse (!) 58   Temp 98.1 F (36.7 C) (Oral)   Ht 6' (1.829 m)   Wt 183 lb 8 oz (83.2 kg)   SpO2 95%   BMI 24.89 kg/m   Body mass index is 24.89 kg/m.  Tobacco History  Smoking Status  . Former Smoker  . Packs/day: 1.00  . Years: 8.00  . Types: Cigarettes  . Quit date: 1973  Smokeless Tobacco  . Never Used     Counseling given: No   Past Medical History:  Diagnosis Date  . DDD (degenerative disc disease)    with epidural injection  . Gastritis    H pylori, partial tx.  EGD 04/2002  . Gout   . History of skin cancer   . Hyperlipidemia   . Hypertension    some white coat component- better at home  . Stroke [facility])    Past Surgical History:  Procedure Laterality Date  . [place]  . LOOP RECORDER INSERTION N/A 12/21/2016   Procedure: Loop Recorder Insertion;  Surgeon: Deboraha Sprang, MD;  Location: [facility];  Service: Cardiovascular;  Laterality: N/A;  . TEE WITHOUT CARDIOVERSION N/A 12/21/2016   Procedure: TRANSESOPHAGEAL ECHOCARDIOGRAM (TEE);  Surgeon: Nelva Bush, MD;  Location: [facility];  Service: Cardiovascular;  Laterality: N/A;   Family History  Problem Relation Age of Onset  . Other Mother     brain tumor  . Skin cancer Mother     suspect melanoma  . Heart attack Father 54  . Colon cancer Sister    History  Sexual Activity  . Sexual activity: No    Outpatient Encounter Prescriptions as of 01/18/2017  Medication Sig  . allopurinol (ZYLOPRIM) 300 MG tablet TAKE 1 TABLET(300 MG) BY MOUTH DAILY AT NIGHT  . Ascorbic Acid (VITAMIN C) 1000 MG tablet Take 1,000 mg by mouth  daily.    Marland Kitchen aspirin EC 81 MG tablet Take 162 mg by mouth 2 (two) times daily.  Marland Kitchen atorvastatin (LIPITOR) 20 MG tablet Take 1 tablet (20 mg total) by mouth daily.  . bisoprolol (ZEBETA) 5 MG tablet Take 1 tablet (5 mg total) by mouth daily.  Marland Kitchen CALCIUM PO Take 1 tablet by mouth daily.   . Calcium-Magnesium-Zinc 1000-400-15 MG TABS Take 1 tablet by mouth daily.    . hydrochlorothiazide (HYDRODIURIL) 12.5 MG tablet Take 1 tablet (12.5 mg total) by mouth 2 (two) times daily.  Marland Kitchen lisinopril (PRINIVIL,ZESTRIL) 20 MG tablet TAKE 1 TABLET(20 MG) BY MOUTH DAILY (Patient taking differently: TAKE 1 TABLET(20 MG) BY MOUTH DAILY AT NIGHT)  . Omega-3 Fatty Acids (FISH OIL PO) Take 1 capsule by mouth daily.   Marland Kitchen VITAMIN E PO Take 1 capsule by mouth daily.    No facility-administered encounter medications on file as of 01/18/2017.     Activities of Daily Living In your present state of health, do you have any difficulty performing the following activities: 01/18/2017 01/21/2016  Hearing? N N  Vision? N N  Difficulty concentrating or making decisions? N N  Walking or climbing stairs? N N  Dressing or bathing? N N  Doing errands, shopping? N N  Preparing  Food and eating ? N N  Using the Toilet? N N  In the past six months, have you accidently leaked urine? N N  Do you have problems with loss of bowel control? N N  Managing your Medications? N N  Managing your Finances? N N  Housekeeping or managing your Housekeeping? N N  Some recent data might be hidden    Patient Care Team: Abner Greenspan, MD as PCP - General   Assessment:    Vision Screening Comments: Vision exam in Summer 2017  Exercise Activities and Dietary recommendations Current Exercise Habits: Home exercise routine, Type of exercise: walking;Other - see comments (running, play tennis, stationary bike), Time (Minutes): 60, Frequency (Times/Week): 7, Weekly Exercise (Minutes/Week): 420, Intensity: Moderate, Exercise limited by: None  identified  Goals    . Have 3 meals a day          Starting 01/18/2017, I will continue to make a healthy protein choice for breakfast in order to avoid skipping meals.       Fall Risk Fall Risk  01/18/2017 11/10/2016 01/21/2016 01/14/2016 08/01/2013  Falls in the past year? No No No No No   Depression Screen PHQ 2/9 Scores 01/18/2017 01/21/2016 01/14/2016 08/01/2013  PHQ - 2 Score 0 0 0 0    Cognitive Function MMSE - Mini Mental State Exam 01/18/2017 01/21/2016  Orientation to time 5 5  Orientation to Place 5 5  Registration 3 3  Attention/ Calculation 0 0  Recall 3 3  Language- name 2 objects 0 0  Language- repeat 1 1  Language- follow 3 step command 3 3  Language- read & follow direction 0 0  Write a sentence 0 0  Copy design 0 0  Total score 20 20     PLEASE NOTE: A Mini-Cog screen was completed. Maximum score is 20. A value of 0 denotes this part of [place]stein MMSE was not completed or the patient failed this part of the Mini-Cog screening.   Mini-Cog Screening Orientation to Time - Max 5 pts Orientation to Place - Max 5 pts Registration - Max 3 pts Recall - Max 3 pts Language Repeat - Max 1 pts Language Follow 3 Step Command - Max 3 pts     Immunization History  Administered Date(s) Administered  . Hepatitis A, Adult 07/21/2016  . Influenza Split 08/25/2011  . Influenza,inj,Quad PF,36+ Mos 08/01/2013, 11/05/2014, 07/21/2016  . Pneumococcal Conjugate-13 11/05/2014  . Pneumococcal Polysaccharide-23 08/25/2011  . Td 04/01/2003, 08/01/2013   Screening Tests Health Maintenance  Topic Date Due  . INFLUENZA VACCINE  05/11/2017  . TETANUS/TDAP  08/02/2023  . COLONOSCOPY  11/29/2023  . PNA vac Low Risk Adult  Completed      Plan:     I have personally reviewed and addressed the Medicare Annual Wellness questionnaire and have noted the following in the patient's chart:  A. Medical and social history B. Use of alcohol, tobacco or illicit drugs  C. Current medications  and supplements D. Functional ability and status E.  Nutritional status F.  Physical activity G. Advance directives H. List of other physicians I.  Hospitalizations, surgeries, and ER visits in previous 12 months J.  [place] to include hearing, vision, cognitive, depression L. Referrals and appointments - none  In addition, I have reviewed and discussed with patient certain preventive protocols, quality metrics, and best practice recommendations. A written personalized care plan for preventive services as well as general preventive health recommendations were provided to patient.  See attached scanned questionnaire for additional information.   Signed,   Lindell Noe, MHA, BS, LPN Health Coach'

## 2017-01-18 NOTE — Progress Notes (Signed)
PCP notes:   Health maintenance:  No gaps identified.   Abnormal screenings:   None  Patient concerns:   None  Nurse concerns:  None  Next PCP appt:   01/23/18 @ 0930  I reviewed health advisor's note, was available for consultation, and agree with documentation and plan. Loura Pardon MD

## 2017-01-18 NOTE — Patient Instructions (Signed)
Jeffrey [name] , Thank you for taking time to come for your Medicare Wellness Visit. I appreciate your ongoing commitment to your health goals. Please review the following plan we discussed and let me know if I can assist you in the future.   These are the goals we discussed: Goals    . Have 3 meals a day          Starting 01/18/2017, I will continue to make a healthy protein choice for breakfast in order to avoid skipping meals.        This is a list of the screening recommended for you and due dates:  Health Maintenance  Topic Date Due  . Flu Shot  05/11/2017  . Tetanus Vaccine  08/02/2023  . Colon Cancer Screening  11/29/2023  . Pneumonia vaccines  Completed   Preventive Care for Adults  A healthy lifestyle and preventive care can promote health and wellness. Preventive health guidelines for adults include the following key practices.  . A routine yearly physical is a good way to check with your health care provider about your health and preventive screening. It is a chance to share any concerns and updates on your health and to receive a thorough exam.  . Visit your dentist for a routine exam and preventive care every 6 months. Brush your teeth twice a day and floss once a day. Good oral hygiene prevents tooth decay and gum disease.  . The frequency of eye exams is based on your age, health, family medical history, use  of contact lenses, and other factors. Follow your health care provider's ecommendations for frequency of eye exams.  . Eat a healthy diet. Foods like vegetables, fruits, whole grains, low-fat dairy products, and lean protein foods contain the nutrients you need without too many calories. Decrease your intake of foods high in solid fats, added sugars, and salt. Eat the right amount of calories for you. Get information about a proper diet from your health care provider, if necessary.  . Regular physical exercise is one of the most important things you can do for your  health. Most adults should get at least 150 minutes of moderate-intensity exercise (any activity that increases your heart rate and causes you to sweat) each week. In addition, most adults need muscle-strengthening exercises on 2 or more days a week.  Silver Sneakers may be a benefit available to you. To determine eligibility, you may visit the website: www.silversneakers.com or contact program at [phone] Mon-Fri between 8AM-8PM.   . Maintain a healthy weight. The body mass index (BMI) is a screening tool to identify possible weight problems. It provides an estimate of body fat based on height and weight. Your health care provider can find your BMI and can help you achieve or maintain a healthy weight.   For adults 20 years and older: ? A BMI below 18.5 is considered underweight. ? A BMI of 18.5 to 24.9 is normal. ? A BMI of 25 to 29.9 is considered overweight. ? A BMI of 30 and above is considered obese.   . Maintain normal blood lipids and cholesterol levels by exercising and minimizing your intake of saturated fat. Eat a balanced diet with plenty of fruit and vegetables. Blood tests for lipids and cholesterol should begin at age 45 and be repeated every 5 years. If your lipid or cholesterol levels are high, you are over 50, or you are at high risk for heart disease, you may need your cholesterol levels checked more frequently.  Ongoing high lipid and cholesterol levels should be treated with medicines if diet and exercise are not working.  . If you smoke, find out from your health care provider how to quit. If you do not use tobacco, please do not start.  . If you choose to drink alcohol, please do not consume more than 2 drinks per day. One drink is considered to be 12 ounces (355 mL) of beer, 5 ounces (148 mL) of wine, or 1.5 ounces (44 mL) of liquor.  . If you are 57-56 years old, ask your health care provider if you should take aspirin to prevent strokes.  . Use sunscreen. Apply  sunscreen liberally and repeatedly throughout the day. You should seek shade when your shadow is shorter than you. Protect yourself by wearing long sleeves, pants, a wide-brimmed hat, and sunglasses year round, whenever you are outdoors.  . Once a month, do a whole body skin exam, using a mirror to look at the skin on your back. Tell your health care provider of new moles, moles that have irregular borders, moles that are larger than a pencil eraser, or moles that have changed in shape or color.

## 2017-01-18 NOTE — Progress Notes (Signed)
Pre visit review using our clinic review tool, if applicable. No additional management support is needed unless otherwise documented below in the visit note. 

## 2017-01-20 ENCOUNTER — Ambulatory Visit (INDEPENDENT_AMBULATORY_CARE_PROVIDER_SITE_OTHER): Payer: PPO | Admitting: *Deleted

## 2017-01-20 DIAGNOSIS — I639 Cerebral infarction, unspecified: Secondary | ICD-10-CM | POA: Diagnosis not present

## 2017-01-21 ENCOUNTER — Ambulatory Visit: Payer: PPO

## 2017-01-24 ENCOUNTER — Ambulatory Visit (INDEPENDENT_AMBULATORY_CARE_PROVIDER_SITE_OTHER): Payer: Self-pay | Admitting: *Deleted

## 2017-01-24 DIAGNOSIS — I639 Cerebral infarction, unspecified: Secondary | ICD-10-CM

## 2017-01-24 LAB — CUP PACEART INCLINIC DEVICE CHECK
Implantable Pulse Generator Implant Date: 20180313
MDC IDC SESS DTM: 20180416121927

## 2017-01-24 NOTE — Progress Notes (Signed)
Wound check appointment. Steri-strips previously removed by patient. Wound without redness or edema. Incision edges approximated, wound well healed. Normal device function. Battery status: good. R-waves 0.1mV. No symptom or tachy episodes. Pause and brady detection off. 1 AF episode--false, ST w/PACs, artifact, and undersensing; ECG reviewed by SK, no recommended changes at this time. Patient educated about wound care and Carelink monitor. Monthly summary reports and ROV with SK PRN.

## 2017-01-25 NOTE — Progress Notes (Signed)
Carelink Summary Report / Loop Recorder 

## 2017-01-31 ENCOUNTER — Ambulatory Visit (INDEPENDENT_AMBULATORY_CARE_PROVIDER_SITE_OTHER): Payer: PPO | Admitting: Neurology

## 2017-01-31 DIAGNOSIS — G4761 Periodic limb movement disorder: Secondary | ICD-10-CM

## 2017-01-31 DIAGNOSIS — G4720 Circadian rhythm sleep disorder, unspecified type: Secondary | ICD-10-CM

## 2017-01-31 DIAGNOSIS — G4730 Sleep apnea, unspecified: Secondary | ICD-10-CM | POA: Diagnosis not present

## 2017-01-31 LAB — CUP PACEART REMOTE DEVICE CHECK
Implantable Pulse Generator Implant Date: 20180313
MDC IDC SESS DTM: 20180412225342

## 2017-02-03 ENCOUNTER — Telehealth: Payer: Self-pay

## 2017-02-03 NOTE — Progress Notes (Signed)
Patient referred by Dr. Leonie Man, seen by me on 12/21/16, diagnostic PSG on 01/31/17.   Please call and notify the patient that the recent sleep study did not show any significant obstructive sleep apnea. 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 He did have a hard time going to sleep and staying asleep.  Please remind patient to try to maintain good sleep hygiene, which means: Keep a regular sleep and wake schedule, try not to exercise or have a meal within 2 hours of your bedtime, try to keep your bedroom conducive for sleep, that is, cool and dark, without light distractors such as an illuminated alarm clock, and refrain from watching TV right before sleep or in the middle of the night and do not keep the TV or radio on during the night. Also, try not to use or play on electronic devices at bedtime, such as your cell phone, tablet PC or laptop. If you like to read at bedtime on an electronic device, try to dim the background light as much as possible. Do not eat in the middle of the night.   He can FU with Dr. Leonie Man and his other providers, ie PCP. Once you have spoken to patient, you can close this encounter.   Thanks,  Star Age, MD, PhD [facility])

## 2017-02-03 NOTE — Telephone Encounter (Signed)
-----   Message from Star Age, MD sent at 02/03/2017  8:08 AM EDT ----- Patient referred by Dr. Leonie Man, seen by me on 12/21/16, diagnostic PSG on 01/31/17.   Please call and notify the patient that the recent sleep study did not show any significant obstructive sleep apnea. 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 He did have a hard time going to sleep and staying asleep.  Please remind patient to try to maintain good sleep hygiene, which means: Keep a regular sleep and wake schedule, try not to exercise or have a meal within 2 hours of your bedtime, try to keep your bedroom conducive for sleep, that is, cool and dark, without light distractors such as an illuminated alarm clock, and refrain from watching TV right before sleep or in the middle of the night and do not keep the TV or radio on during the night. Also, try not to use or play on electronic devices at bedtime, such as your cell phone, tablet PC or laptop. If you like to read at bedtime on an electronic device, try to dim the background light as much as possible. Do not eat in the middle of the night.   He can FU with Dr. Leonie Man and his other providers, ie PCP. Once you have spoken to patient, you can close this encounter.   Thanks,  Star Age, MD, PhD [facility])

## 2017-02-03 NOTE — Telephone Encounter (Signed)
I called pt. I advised pt that Dr. Rexene Alberts reviewed pt's sleep study and found that pt does not have any significant sleep apnea but did have some PLMs. Pt also had a hard time going to sleep and staying asleep. Dr. Rexene Alberts recommends that pt follow up with Dr Leonie Man and his other. I reviewed sleep hygiene recommendations with the pt, including trying to keep a regular sleep wake schedule, avoiding electronics in the bedroom, keeping the bedroom cool, dark, and quiet, and avoiding eating or exercising within 2 hours of bedtime as well as eating in the middle of the night. I advised pt that a copy of these sleep study results will be sent to Dr. Leonie Man. Pt verbalized understanding of results. Pt asked for the sleep study results to be released to mychart and so I released the results to mychart for him. Pt had no questions at this time but was encouraged to call back if questions arise.

## 2017-02-03 NOTE — Procedures (Signed)
PATIENT'S NAME:  Jeffrey [name], Jeffrey [name] DOB:      June 13, 1941      MR#:    [id number]     DATE OF RECORDING: 01/31/2017 REFERRING M.D.: Antony Contras, MD, PCP:  Abner Greenspan, MD Study Performed:   Baseline Polysomnogram HISTORY: 76 year old man with a history of hypertension, hyperlipidemia, gout, gastritis, skin cancer, degenerative disc disease, stroke, who reports snoring and witnessed apneas per wife's report. His Epworth sleepiness score is 4 out of 24, his fatigue score is 18 out of 63. The patient's weight 181 pounds with a height of 72 (inches), resulting in a BMI of 24.5 kg/m2. The patient's neck circumference measured 16.5 inches.  CURRENT MEDICATIONS: Allopurinol, Ascorbic acid aspirin, Atorvastain, Bisoprolol, Calcium, Hydrochlorothiazide, Lisinopril, Omega-3 and Vitamin E   PROCEDURE:  This is a multichannel digital polysomnogram utilizing the Somnostar 11.2 system.  Electrodes and sensors were applied and monitored per AASM Specifications.   EEG, EOG, Chin and Limb EMG, were sampled at 200 Hz.  ECG, Snore and Nasal Pressure, Thermal Airflow, Respiratory Effort, CPAP Flow and Pressure, Oximetry was sampled at 50 Hz. Digital video and audio were recorded.      BASELINE STUDY  Lights Out was at 22:42 and Lights On at 05:00.  Total recording time (TRT) was 378 minutes, with a total sleep time (TST) of 168.5 minutes.   The patient's sleep latency was markedly delayed.  REM latency was 190 minutes, which is delayed.  The sleep efficiency was 44.6 %, which is markedly reduced.     SLEEP ARCHITECTURE: WASO (Wake after sleep onset) was highly elevated with moderate sleep fragmentation noted.  There were 23.5 minutes in Stage N1, 98.5 minutes Stage N2, 0 minutes Stage N3 and 46.5 minutes in Stage REM.  The percentage of Stage N1 was 13.9%, which is increased, Stage N2 was 58.5%, which is mildly increased, Stage N3 was absent and Stage R (REM sleep) was 27.6%, which is mildly increased.   The arousals  were noted as: 26 were spontaneous, 13 were associated with PLMs, 2 were associated with respiratory events.    Audio and video analysis did not show any abnormal or unusual movements, behaviors, phonations or vocalizations.  The patient took 1 bathroom break. Minimal to mild intermittent snoring was noted.  EKG was in keeping with normal sinus rhythm (NSR).  RESPIRATORY ANALYSIS:  There were a total of 2 respiratory events:  0 obstructive apneas, 0 central apneas and 0 mixed apneas with a total of 0 apneas and an apnea index (AI) of 0 /hour. There were 2 hypopneas with a hypopnea index of .7 /hour. The patient also had 0 respiratory event related arousals (RERAs).      The total APNEA/HYPOPNEA INDEX (AHI) was .7/hour and the total RESPIRATORY DISTURBANCE INDEX was .7 /hour.  0 events occurred in REM sleep and 4 events in NREM. The REM AHI was 0 /hour, versus a non-REM AHI of 1.. The patient spent 9.5 minutes of total sleep time in the supine position and 159 minutes in non-supine.. The supine AHI was 0.0 versus a non-supine AHI of 0.8.  OXYGEN SATURATION & C02:  The Wake baseline 02 saturation was 94%, with the lowest being 90%. Time spent below 89% saturation equaled 0 minutes.  PERIODIC LIMB MOVEMENTS: The patient had a total of 66 Periodic Limb Movements.  The Periodic Limb Movement (PLM) index was 23.5 and the PLM Arousal index was 4.6/hour.  Post-study, the patient indicated that sleep was worse than usual.  IMPRESSION:  1. Periodic Limb Movement Disorder (PLMD) 2. Dysfunctions associated with sleep stages or arousal from sleep  RECOMMENDATIONS:  1. This study does not demonstrate any significant obstructive or central sleep disordered breathing.  2. Mild PLMs (periodic limb movements of sleep) were noted during this study without significant arousals; clinical correlation is recommended.  3. This study shows poor sleep efficiency, significant sleep fragmentation and abnormal sleep  stage percentages; these are nonspecific findings and per se do not signify an intrinsic sleep disorder or a cause for the patient's sleep-related symptoms. Causes include (but are not limited to) the first night effect of the sleep study, circadian rhythm disturbances, medication effect or an underlying mood disorder or medical problem.  4. The patient should be cautioned not to drive, work at heights, or operate dangerous or heavy equipment when tired or sleepy. Review and reiteration of good sleep hygiene measures should be pursued with any patient. 5. The patient can follow-up with his referring provider, who will be notified of the test results.    I certify that I have reviewed the entire raw data recording prior to the issuance of this report in accordance with the Standards of Accreditation of the American Academy of Sleep Medicine (AASM)    Star Age, MD, PhD Diplomat, American Board of Psychiatry and Neurology (Neurology and Sleep Medicine)

## 2017-02-21 ENCOUNTER — Ambulatory Visit (INDEPENDENT_AMBULATORY_CARE_PROVIDER_SITE_OTHER): Payer: PPO | Admitting: *Deleted

## 2017-02-21 DIAGNOSIS — I639 Cerebral infarction, unspecified: Secondary | ICD-10-CM | POA: Diagnosis not present

## 2017-02-21 NOTE — Progress Notes (Signed)
Carelink Summary Report / Loop Recorder 

## 2017-02-22 ENCOUNTER — Encounter: Payer: Self-pay | Admitting: Internal Medicine

## 2017-02-22 ENCOUNTER — Ambulatory Visit (INDEPENDENT_AMBULATORY_CARE_PROVIDER_SITE_OTHER): Payer: PPO | Admitting: Internal Medicine

## 2017-02-22 VITALS — BP 136/86 | HR 56 | Ht 72.0 in | Wt 181.2 lb

## 2017-02-22 DIAGNOSIS — I77810 Thoracic aortic ectasia: Secondary | ICD-10-CM

## 2017-02-22 DIAGNOSIS — I10 Essential (primary) hypertension: Secondary | ICD-10-CM | POA: Diagnosis not present

## 2017-02-22 DIAGNOSIS — I639 Cerebral infarction, unspecified: Secondary | ICD-10-CM

## 2017-02-22 NOTE — Progress Notes (Signed)
Follow-up Outpatient Visit Date: 02/22/2017  Primary Care Provider: Abner Greenspan, MD [place] [address]  Chief Complaint: Follow-up cryptogenic stroke  HPI:  Jeffrey [name] is a 76 y.o. year-old male with history of hypertension, hyperlipidemia, and stroke in 10/2016, who presents for follow-up of cryptogenic stroke. I first met Jeffrey [name] in 11/2016, about a month after his stroke that occurred while visiting [place]. Extensive workup did not reveal an etiology of his stroke. He therefore underwent TTE, TEE, and a loop recorder placement at [facility]. Today, Jeffrey [name] reports that he has been feeling well. He notes minimal change to his speech since his stroke but otherwise does not have any residual deficits. He has not had any new neurologic events. He denies chest pain, shortness of breath, palpitations, lightheadedness, edema, orthopnea, PND, and claudication. He routinely checks his blood pressure at home and notes that is typically well controlled. He has even had some diastolic readings in the 62U to 50s, though he reports being asymptomatic at the time.Marland Kitchen  --------------------------------------------------------------------------------------------------  Cardiovascular History & Procedures: Cardiovascular Problems:  Cryptogenic stroke  Mildly dilated ascending aorta  Risk Factors:  Hypertension, hyperlipidemia, male gender, and age greater than 18  Cath/PCI:  None  CV Surgery:  None  EP Procedures and Devices:  Implantable loop recorder (12/21/16)  24-hour Holter monitor (10/20/16, [place]): Predominant rhythm was sinus with an average rate of 63 bpm (range 46-93 bpm). Rare supraventricular and ventricular ectopy was identified. There was one episode of nonsustained ventricular tachycardia lasting 4 beats. No sustained arrhythmias, including atrial fibrillation/flutter) are evident.  Non-Invasive Evaluation(s):  TEE (12/21/16): Normal LV size with  mild LVH. LVEF 55-65%. Mild aortic regurgitation. Mildly dilated ascending aorta. Mild mitral regurgitation. No left or right atrial thrombus. No PFO or ASD; negative  TTE (12/02/16): Normal LV size with mild LVH. LVEF 55-60% with normal wall motion. Normal diastolic function. Moderate AI. Mildly dilated ascending aorta measuring 3.9 cm. Mild MR. Normal RV size and function.  Transthoracic echocardiogram (10/2016; [place]): Reportedly normal, though no images or reports is available for review.  Recent CV Pertinent Labs: Lab Results  Component Value Date   CHOL 101 01/10/2017   HDL 38.00 (L) 01/10/2017   LDLCALC 25 01/10/2017   LDLDIRECT 55.0 02/10/2015   TRIG 189.0 (H) 01/10/2017   CHOLHDL 3 01/10/2017   INR 1.08 12/10/2016   K 3.8 01/10/2017   K 3.9 05/15/2009   BUN 18 01/10/2017   BUN 12 05/15/2009   CREATININE 0.80 01/10/2017   CREATININE 0.8 05/15/2009    Past medical and surgical history were reviewed and updated in EPIC.  Outpatient Encounter Prescriptions as of 02/22/2017  Medication Sig  . allopurinol (ZYLOPRIM) 300 MG tablet TAKE 1 TABLET(300 MG) BY MOUTH DAILY AT NIGHT  . Ascorbic Acid (VITAMIN C) 1000 MG tablet Take 1,000 mg by mouth daily.    Marland Kitchen aspirin EC 81 MG tablet Take 162 mg by mouth 2 (two) times daily.  Marland Kitchen atorvastatin (LIPITOR) 20 MG tablet Take 1 tablet (20 mg total) by mouth daily.  . bisoprolol (ZEBETA) 5 MG tablet Take 1 tablet (5 mg total) by mouth daily.  Marland Kitchen CALCIUM PO Take 1 tablet by mouth daily.   . Calcium-Magnesium-Zinc 1000-400-15 MG TABS Take 1 tablet by mouth daily.    . hydrochlorothiazide (HYDRODIURIL) 12.5 MG tablet Take 1 tablet (12.5 mg total) by mouth 2 (two) times daily.  Marland Kitchen lisinopril (PRINIVIL,ZESTRIL) 20 MG tablet TAKE 1 TABLET(20 MG) BY  MOUTH DAILY (Patient taking differently: TAKE 1 TABLET(20 MG) BY MOUTH DAILY AT NIGHT)  . Omega-3 Fatty Acids (FISH OIL PO) Take 1 capsule by mouth daily.   Marland Kitchen VITAMIN E PO Take 1 capsule by mouth daily.      No facility-administered encounter medications on file as of 02/22/2017.     Allergies: Patient has no known allergies.  Social History   Social History  . Marital status: Married    Spouse name: N/A  . Number of children: 3  . Years of education: HS   Occupational History  . [employer]    Social History Main Topics  . Smoking status: Former Smoker    Packs/day: 1.00    Years: 8.00    Types: Cigarettes    Quit date: 1973  . Smokeless tobacco: Never Used  . Alcohol use 4.2 oz/week    7 Glasses of wine per week  . Drug use: No  . Sexual activity: No   Other Topics Concern  . Not on file   Social History Narrative   From [place]   Drinks 1 caffeine drink a day     Family History  Problem Relation Age of Onset  . Other Mother        brain tumor  . Skin cancer Mother        suspect melanoma  . Heart attack Father 63  . Colon cancer Sister     Review of Systems: A 12-system review of systems was performed and was negative except as noted in the HPI.  --------------------------------------------------------------------------------------------------  Physical Exam: BP 136/86 (BP Location: Left Arm, Patient Position: Sitting, Cuff Size: Normal)   Pulse (!) 56   Ht 6' (1.829 m)   Wt 181 lb 4 oz (82.2 kg)   BMI 24.58 kg/m   General:  Well-developed, well-nourished man seated comfortably in the exam room. HEENT: No conjunctival pallor or scleral icterus.  Moist mucous membranes.  OP clear. Neck: Supple without lymphadenopathy, thyromegaly, JVD, or HJR. Lungs: Normal work of breathing.  Clear to auscultation bilaterally without wheezes or crackles. Heart: Regular rate and rhythm without murmurs, rubs, or gallops.  Non-displaced PMI. Abd: Bowel sounds present.  Soft, NT/ND without hepatosplenomegaly Ext: No lower extremity edema.  Radial, PT, and DP pulses are 2+ bilaterally. Skin: warm and dry without rash. Left chest implantable loop recorder  site is well-healed.  EKG:  Normal sinus rhythm (heart rate 61 bpm) without significant abnormalities.  Lab Results  Component Value Date   WBC 6.4 01/10/2017   HGB 14.3 01/10/2017   HCT 42.2 01/10/2017   MCV 94.9 01/10/2017   PLT 152.0 01/10/2017    Lab Results  Component Value Date   NA 141 01/10/2017   K 3.8 01/10/2017   CL 101 01/10/2017   CO2 32 01/10/2017   BUN 18 01/10/2017   CREATININE 0.80 01/10/2017   GLUCOSE 132 (H) 01/10/2017   ALT 33 01/10/2017    Lab Results  Component Value Date   CHOL 101 01/10/2017   HDL 38.00 (L) 01/10/2017   LDLCALC 25 01/10/2017   LDLDIRECT 55.0 02/10/2015   TRIG 189.0 (H) 01/10/2017   CHOLHDL 3 01/10/2017    --------------------------------------------------------------------------------------------------  ASSESSMENT AND PLAN: Cryptogenic stroke No new neurologic symptoms. Jeffrey [name] has made a good recovery from his stroke. TEE did not show a structural abnormality to explain stroke. Loop recorder is currently in place and has not revealed any atrial fibrillation/flutter. We will continue with loop recorder monitoring  as well as Jeffrey [name] current medications for secondary prevention.  Hypertension Blood pressure borderline elevated today, though home readings seem to be better. We will not change his medication regimen today.  Mildly dilated ascending aorta TTE and TEE demonstrate mild enlargement of the ascending aorta. We will readdress this at follow-up next year and discuss cross-sectional imaging to ensure stability.  Follow-up: Return to clinic in one year.  Nelva Bush, MD 02/22/2017 8:35 PM

## 2017-02-22 NOTE — Patient Instructions (Signed)
Medication Instructions:  Your physician recommends that you continue on your current medications as directed. Please refer to the Current Medication list given to you today.   Labwork: none  Testing/Procedures: none  Follow-Up: Your physician wants you to follow-up in: [place]. You will receive a reminder letter in the mail two months in advance. If you don't receive a letter, please call our office to schedule the follow-up appointment.  If you need a refill on your cardiac medications before your next appointment, please call your pharmacy.

## 2017-02-23 ENCOUNTER — Encounter: Payer: PPO | Admitting: Internal Medicine

## 2017-02-25 LAB — CUP PACEART REMOTE DEVICE CHECK
Implantable Pulse Generator Implant Date: 20180313
MDC IDC SESS DTM: 20180512163537

## 2017-03-14 ENCOUNTER — Other Ambulatory Visit: Payer: Self-pay | Admitting: Family Medicine

## 2017-03-21 ENCOUNTER — Ambulatory Visit (INDEPENDENT_AMBULATORY_CARE_PROVIDER_SITE_OTHER): Payer: PPO | Admitting: *Deleted

## 2017-03-21 DIAGNOSIS — I639 Cerebral infarction, unspecified: Secondary | ICD-10-CM | POA: Diagnosis not present

## 2017-03-21 NOTE — Progress Notes (Signed)
Carelink Summary Report / Loop Recorder 

## 2017-03-30 LAB — CUP PACEART REMOTE DEVICE CHECK
Date Time Interrogation Session: 20180611163747
MDC IDC PG IMPLANT DT: 20180313

## 2017-03-30 NOTE — Progress Notes (Signed)
Carelink summary report received. Battery status OK. Normal device function. No new symptom episodes, tachy episodes, brady, or pause episodes. No new AF episodes. Monthly summary reports and ROV/PRN 

## 2017-04-06 ENCOUNTER — Telehealth: Payer: Self-pay | Admitting: Cardiology

## 2017-04-06 NOTE — Telephone Encounter (Signed)
LMOVM requesting that pt send manual transmission b/c home monitor has not updated in at least 14 days.    

## 2017-04-20 ENCOUNTER — Ambulatory Visit (INDEPENDENT_AMBULATORY_CARE_PROVIDER_SITE_OTHER): Payer: PPO | Admitting: *Deleted

## 2017-04-20 ENCOUNTER — Encounter: Payer: Self-pay | Admitting: Cardiology

## 2017-04-20 DIAGNOSIS — I639 Cerebral infarction, unspecified: Secondary | ICD-10-CM | POA: Diagnosis not present

## 2017-04-21 NOTE — Progress Notes (Signed)
Carelink Summary Report / Loop Recorder 

## 2017-04-29 LAB — CUP PACEART REMOTE DEVICE CHECK
MDC IDC PG IMPLANT DT: 20180313
MDC IDC SESS DTM: 20180711213911

## 2017-05-12 DIAGNOSIS — Z7902 Long term (current) use of antithrombotics/antiplatelets: Secondary | ICD-10-CM | POA: Diagnosis not present

## 2017-05-12 DIAGNOSIS — Z800 Family history of malignant neoplasm of digestive organs: Secondary | ICD-10-CM | POA: Diagnosis not present

## 2017-05-12 DIAGNOSIS — Z86010 Personal history of colonic polyps: Secondary | ICD-10-CM | POA: Diagnosis not present

## 2017-05-20 ENCOUNTER — Ambulatory Visit (INDEPENDENT_AMBULATORY_CARE_PROVIDER_SITE_OTHER): Payer: PPO | Admitting: *Deleted

## 2017-05-20 DIAGNOSIS — I639 Cerebral infarction, unspecified: Secondary | ICD-10-CM | POA: Diagnosis not present

## 2017-05-27 LAB — CUP PACEART REMOTE DEVICE CHECK
Date Time Interrogation Session: 20180810220811
MDC IDC PG IMPLANT DT: 20180313

## 2017-06-20 ENCOUNTER — Ambulatory Visit (INDEPENDENT_AMBULATORY_CARE_PROVIDER_SITE_OTHER): Payer: PPO | Admitting: *Deleted

## 2017-06-20 DIAGNOSIS — I639 Cerebral infarction, unspecified: Secondary | ICD-10-CM | POA: Diagnosis not present

## 2017-06-20 NOTE — Progress Notes (Signed)
Carelink Summary Report / Loop Recorder 

## 2017-06-22 LAB — CUP PACEART REMOTE DEVICE CHECK
Date Time Interrogation Session: 20180909224114
Implantable Pulse Generator Implant Date: 20180313

## 2017-07-19 ENCOUNTER — Ambulatory Visit (INDEPENDENT_AMBULATORY_CARE_PROVIDER_SITE_OTHER): Payer: PPO | Admitting: *Deleted

## 2017-07-19 DIAGNOSIS — I639 Cerebral infarction, unspecified: Secondary | ICD-10-CM

## 2017-07-20 NOTE — Progress Notes (Signed)
Carelink Summary Report / Loop Recorder 

## 2017-07-21 LAB — CUP PACEART REMOTE DEVICE CHECK
Implantable Pulse Generator Implant Date: 20180313
MDC IDC SESS DTM: 20181009234016

## 2017-08-06 ENCOUNTER — Other Ambulatory Visit: Payer: Self-pay | Admitting: Family Medicine

## 2017-08-18 ENCOUNTER — Ambulatory Visit (INDEPENDENT_AMBULATORY_CARE_PROVIDER_SITE_OTHER): Payer: PPO | Admitting: *Deleted

## 2017-08-18 DIAGNOSIS — I639 Cerebral infarction, unspecified: Secondary | ICD-10-CM | POA: Diagnosis not present

## 2017-08-19 NOTE — Progress Notes (Signed)
Carelink Summary Report / Loop Recorder 

## 2017-08-25 LAB — CUP PACEART REMOTE DEVICE CHECK
Implantable Pulse Generator Implant Date: 20180313
MDC IDC SESS DTM: 20181109003909

## 2017-09-19 ENCOUNTER — Ambulatory Visit (INDEPENDENT_AMBULATORY_CARE_PROVIDER_SITE_OTHER): Payer: PPO | Admitting: *Deleted

## 2017-09-19 DIAGNOSIS — I639 Cerebral infarction, unspecified: Secondary | ICD-10-CM | POA: Diagnosis not present

## 2017-09-19 NOTE — Progress Notes (Signed)
Carelink Summary Report / Loop Recorder 

## 2017-09-22 LAB — CUP PACEART REMOTE DEVICE CHECK
Date Time Interrogation Session: 20181209010930
Implantable Pulse Generator Implant Date: 20180313

## 2017-09-26 ENCOUNTER — Encounter: Payer: Self-pay | Admitting: Family Medicine

## 2017-09-26 ENCOUNTER — Ambulatory Visit: Payer: PPO | Admitting: Family Medicine

## 2017-09-26 VITALS — BP 128/78 | HR 70 | Temp 98.5°F | Ht 72.0 in | Wt 185.0 lb

## 2017-09-26 DIAGNOSIS — J069 Acute upper respiratory infection, unspecified: Secondary | ICD-10-CM | POA: Diagnosis not present

## 2017-09-26 MED ORDER — AZITHROMYCIN 250 MG PO TABS: ORAL_TABLET | ORAL | 0 refills | Status: DC

## 2017-09-26 MED ORDER — LEVOFLOXACIN 500 MG PO TABS
500.0000 mg | ORAL_TABLET | Freq: Every day | ORAL | 0 refills | Status: DC
Start: 2017-09-26 — End: 2017-10-28

## 2017-09-26 NOTE — Progress Notes (Signed)
Dr. Frederico Hamman T. Katrin Grabel, MD, [facility] [place], [address] Phone: [phone] Fax: [phone]  09/26/2017  Patient: Jeffrey [name], MRN: [id number], DOB: 03/10/1941, 76 y.o.  Primary Physician:  Tower, Wynelle Fanny, MD   Chief Complaint  Patient presents with  . Cough    non-productive, started Satuday   Subjective:   Jeffrey [name] is a 76 y.o. very pleasant male patient who presents with the following:  Last Saturday, ran a fever. Felt cold and a little shaky. Went to bed and felt a little better. Lasted longer through the weekend. Has taken some Mucinex. Going to [place], [place], [place].  Some runny nose and coughing.  Past Medical History, Surgical History, Social History, Family History, Problem List, Medications, and Allergies have been reviewed and updated if relevant.  Patient Active Problem List   Diagnosis Date Noted  . Ascending aorta dilation ([place]) 02/22/2017  . Cryptogenic stroke ([place]) 11/08/2016  . Constipation 11/08/2016  . Dysuria 04/27/2016  . Blurred vision 11/05/2014  . Prostate cancer screening 11/05/2014  . Family history of colon cancer 08/01/2013  . Colon cancer screening 08/01/2013  . Routine general medical examination at a health care facility 08/01/2013  . PSA, INCREASED 09/07/2010  . 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 Thrombocytopenia ([place]) 01/16/2009  . Hyperlipidemia 06/08/2007  . Gout 06/08/2007  . Essential hypertension 06/08/2007  . Hyperglycemia 06/08/2007  . SKIN CANCER, HX OF 06/08/2007    Past Medical History:  Diagnosis Date  . DDD (degenerative disc disease)    with epidural injection  . Gastritis    H pylori, partial tx.  EGD 04/2002  . Gout   . History of skin cancer   . Hyperlipidemia   . Hypertension    some white coat component- better at home  . Stroke [facility])     Past Surgical History:  Procedure Laterality Date  .  [place]  . LOOP RECORDER INSERTION N/A 12/21/2016   Procedure: Loop Recorder Insertion;  Surgeon: Deboraha Sprang, MD;  Location: [facility];  Service: Cardiovascular;  Laterality: N/A;  . TEE WITHOUT CARDIOVERSION N/A 12/21/2016   Procedure: TRANSESOPHAGEAL ECHOCARDIOGRAM (TEE);  Surgeon: Nelva Bush, MD;  Location: [facility] ORS;  Service: Cardiovascular;  Laterality: N/A;    Social History   Socioeconomic History  . Marital status: Married    Spouse name: Not on file  . Number of children: 3  . Years of education: HS  . Highest education level: Not on file  Social Needs  . Financial resource strain: Not on file  . Food insecurity - worry: Not on file  . Food insecurity - inability: Not on file  . Transportation needs - medical: Not on file  . Transportation needs - non-medical: Not on file  Occupational History  . Occupation: [employer]  Tobacco Use  . Smoking status: Former Smoker    Packs/day: 1.00    Years: 8.00    Pack years: 8.00    Types: Cigarettes    Last attempt to quit: 1973    Years since quitting: 45.9  . Smokeless tobacco: Never Used  Substance and Sexual Activity  . Alcohol use: Yes    Alcohol/week: 4.2 oz    Types: 7 Glasses of wine per week  . Drug use: No  . Sexual activity: No  Other Topics Concern  . Not on file  Social History  Narrative   From [place]   Drinks 1 caffeine drink a day     Family History  Problem Relation Age of Onset  . Other Mother        brain tumor  . Skin cancer Mother        suspect melanoma  . Heart attack Father 70  . Colon cancer Sister     No Known Allergies  Medication list reviewed and updated in full in [place].  ROS: GEN: Acute illness details above GI: Tolerating PO intake GU: maintaining adequate hydration and urination Pulm: No SOB Interactive and getting along well at home.  Otherwise, ROS is as per the HPI.  Objective:   BP 128/78   Pulse 70    Temp 98.5 F (36.9 C) (Oral)   Ht 6' (1.829 m)   Wt 185 lb (83.9 kg)   SpO2 96%   BMI 25.09 kg/m    Gen: WDWN, NAD; A & O x3, cooperative. Pleasant.Globally Non-toxic HEENT: Normocephalic and atraumatic. Throat clear, w/o exudate, R TM clear, L TM - good landmarks, No fluid present. rhinnorhea.  MMM Frontal sinuses: NT Max sinuses: NT NECK: Anterior cervical  LAD is absent CV: RRR, No M/G/R, cap refill <2 sec PULM: Breathing comfortably in no respiratory distress. no wheezing, crackles, rhonchi EXT: No c/c/e PSYCH: Friendly, good eye contact MSK: Nml gait   Laboratory and Imaging Data:  Assessment and Plan:   URI, acute  He likely has a cold.  Given that he is going to the [place] on an extended travel expedition, I think that it is not unreasonable to give him some antibiotics to take in  case he worsens or develops a fever.  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  I asked him to  use neither of these prescriptions unless he became symptomatic.  Follow-up: No Follow-up on file.  Future Appointments  Date Time Provider [place]  10/17/2017  9:10 AM [facility] DEVICE REMOTES [facility]  01/09/2018  8:00 AM [facility]  01/23/2018  9:00 AM Pinson, Venetia Maxon, LPN [facility]  02/16/3266  9:30 AM Tower, Wynelle Fanny, MD [facility]    Meds ordered this encounter  Medications  . azithromycin (ZITHROMAX) 250 MG tablet    Sig: 2 tabs po on day 1, then 1 tab po for 4 days    Dispense:  6 tablet    Refill:  0  . levofloxacin (LEVAQUIN) 500 MG tablet    Sig: Take 1 tablet (500 mg total) by mouth daily. For prostate infection    Dispense:  14 tablet    Refill:  0   Signed,  Saudia Smyser T. Laneshia Pina, MD   Allergies as of 09/26/2017   No Known Allergies     Medication List        Accurate as of 09/26/17 11:59 PM. Always use your most recent med list.            allopurinol 300 MG tablet Commonly known as:  ZYLOPRIM TAKE 1 TABLET(300 MG) BY MOUTH DAILY AT NIGHT   aspirin EC 81 MG tablet Take 162 mg by mouth 2 (two) times daily.   atorvastatin 20 MG tablet Commonly known as:  LIPITOR Take 1 tablet (20 mg total) by mouth daily.   azithromycin 250 MG tablet Commonly known as:  ZITHROMAX 2 tabs po on day 1, then 1 tab  po for 4 days   bisoprolol 5 MG tablet Commonly known as:  ZEBETA TAKE 1 AND 1/2 TABLETS(7.5 MG) BY MOUTH DAILY   CALCIUM PO Take 1 tablet by mouth daily.   Calcium-Magnesium-Zinc 1000-400-15 MG Tabs Take 1 tablet by mouth daily.   FISH OIL PO Take 1 capsule by mouth daily.   hydrochlorothiazide 12.5 MG tablet Commonly known as:  HYDRODIURIL Take 1 tablet (12.5 mg total) by mouth 2 (two) times daily.   levofloxacin 500 MG tablet Commonly known as:  LEVAQUIN Take 1 tablet (500 mg total) by mouth daily. For prostate infection   lisinopril 20 MG tablet Commonly known as:  PRINIVIL,ZESTRIL TAKE 1 TABLET(20 MG) BY MOUTH DAILY   vitamin C 1000 MG tablet Take 1,000 mg by mouth daily.   VITAMIN E PO Take 1 capsule by mouth daily.

## 2017-09-27 ENCOUNTER — Encounter: Payer: Self-pay | Admitting: Family Medicine

## 2017-10-17 ENCOUNTER — Ambulatory Visit (INDEPENDENT_AMBULATORY_CARE_PROVIDER_SITE_OTHER): Payer: PPO | Admitting: *Deleted

## 2017-10-17 DIAGNOSIS — I639 Cerebral infarction, unspecified: Secondary | ICD-10-CM | POA: Diagnosis not present

## 2017-10-17 DIAGNOSIS — H40153 Residual stage of open-angle glaucoma, bilateral: Secondary | ICD-10-CM | POA: Diagnosis not present

## 2017-10-18 NOTE — Progress Notes (Signed)
Carelink Summary Report / Loop Recorder 

## 2017-10-28 LAB — CUP PACEART REMOTE DEVICE CHECK
Implantable Pulse Generator Implant Date: 20180313
MDC IDC SESS DTM: 20190108011418

## 2017-10-31 ENCOUNTER — Encounter: Admission: RE | Payer: Self-pay | Source: Ambulatory Visit

## 2017-10-31 ENCOUNTER — Ambulatory Visit: Admission: RE | Admit: 2017-10-31 | Payer: PPO | Source: Ambulatory Visit | Admitting: Gastroenterology

## 2017-10-31 SURGERY — COLONOSCOPY WITH PROPOFOL
Anesthesia: General

## 2017-11-04 ENCOUNTER — Other Ambulatory Visit: Payer: Self-pay | Admitting: Family Medicine

## 2017-11-04 ENCOUNTER — Other Ambulatory Visit: Payer: Self-pay | Admitting: Internal Medicine

## 2017-11-04 MED ORDER — HYDROCHLOROTHIAZIDE 12.5 MG PO TABS: 12.5000 mg | ORAL_TABLET | Freq: Two times a day (BID) | ORAL | 3 refills | Status: DC

## 2017-11-04 NOTE — Telephone Encounter (Signed)
Please advise which dose should be refilled.   Per medication list:  . hydrochlorothiazide (HYDRODIURIL) 12.5 MG tablet Take 1 tablet (12.5 mg total) by mouth 2 (two) times daily.

## 2017-11-16 ENCOUNTER — Ambulatory Visit (INDEPENDENT_AMBULATORY_CARE_PROVIDER_SITE_OTHER): Payer: PPO | Admitting: *Deleted

## 2017-11-16 DIAGNOSIS — Z86010 Personal history of colonic polyps: Secondary | ICD-10-CM | POA: Diagnosis not present

## 2017-11-16 DIAGNOSIS — I639 Cerebral infarction, unspecified: Secondary | ICD-10-CM

## 2017-11-16 DIAGNOSIS — Z7902 Long term (current) use of antithrombotics/antiplatelets: Secondary | ICD-10-CM | POA: Diagnosis not present

## 2017-11-17 DIAGNOSIS — H40153 Residual stage of open-angle glaucoma, bilateral: Secondary | ICD-10-CM | POA: Diagnosis not present

## 2017-11-17 NOTE — Progress Notes (Signed)
Carelink Summary Report / Loop Recorder 

## 2017-12-05 ENCOUNTER — Other Ambulatory Visit: Payer: Self-pay | Admitting: Family Medicine

## 2017-12-09 LAB — CUP PACEART REMOTE DEVICE CHECK
Date Time Interrogation Session: 20190207014022
MDC IDC PG IMPLANT DT: 20180313

## 2017-12-19 ENCOUNTER — Ambulatory Visit (INDEPENDENT_AMBULATORY_CARE_PROVIDER_SITE_OTHER): Payer: PPO | Admitting: *Deleted

## 2017-12-19 DIAGNOSIS — I639 Cerebral infarction, unspecified: Secondary | ICD-10-CM

## 2017-12-20 NOTE — Progress Notes (Signed)
Carelink Summary Report / Loop Recorder 

## 2018-01-03 ENCOUNTER — Telehealth: Payer: Self-pay | Admitting: Cardiology

## 2018-01-03 NOTE — Telephone Encounter (Signed)
LMOVM requesting that pt send manual transmission b/c home monitor has not updated in at least 14 days.    

## 2018-01-08 ENCOUNTER — Telehealth: Payer: Self-pay | Admitting: Family Medicine

## 2018-01-08 DIAGNOSIS — I10 Essential (primary) hypertension: Secondary | ICD-10-CM

## 2018-01-08 DIAGNOSIS — D696 Thrombocytopenia, unspecified: Secondary | ICD-10-CM

## 2018-01-08 DIAGNOSIS — R972 Elevated prostate specific antigen [PSA]: Secondary | ICD-10-CM

## 2018-01-08 DIAGNOSIS — E7800 Pure hypercholesterolemia, unspecified: Secondary | ICD-10-CM

## 2018-01-08 DIAGNOSIS — M109 Gout, unspecified: Secondary | ICD-10-CM

## 2018-01-08 DIAGNOSIS — R739 Hyperglycemia, unspecified: Secondary | ICD-10-CM

## 2018-01-08 NOTE — Telephone Encounter (Signed)
-----   Message from Lendon Collar, RT sent at 01/05/2018 10:47 AM EDT ----- Regarding: Lab orders for Wed April 3rd Please enter CPE lab orders for April 3rd. Thanks-Lauren

## 2018-01-09 ENCOUNTER — Other Ambulatory Visit: Payer: PPO

## 2018-01-11 ENCOUNTER — Other Ambulatory Visit (INDEPENDENT_AMBULATORY_CARE_PROVIDER_SITE_OTHER): Payer: PPO

## 2018-01-11 DIAGNOSIS — I10 Essential (primary) hypertension: Secondary | ICD-10-CM | POA: Diagnosis not present

## 2018-01-11 DIAGNOSIS — R739 Hyperglycemia, unspecified: Secondary | ICD-10-CM

## 2018-01-11 DIAGNOSIS — D696 Thrombocytopenia, unspecified: Secondary | ICD-10-CM | POA: Diagnosis not present

## 2018-01-11 DIAGNOSIS — E7800 Pure hypercholesterolemia, unspecified: Secondary | ICD-10-CM | POA: Diagnosis not present

## 2018-01-11 DIAGNOSIS — M109 Gout, unspecified: Secondary | ICD-10-CM

## 2018-01-11 DIAGNOSIS — R972 Elevated prostate specific antigen [PSA]: Secondary | ICD-10-CM

## 2018-01-11 LAB — URIC ACID: Uric Acid, Serum: 7 mg/dL (ref 4.0–7.8)

## 2018-01-11 LAB — COMPREHENSIVE METABOLIC PANEL
ALBUMIN: 3.9 g/dL (ref 3.5–5.2)
ALT: 46 U/L (ref 0–53)
AST: 39 U/L — AB (ref 0–37)
Alkaline Phosphatase: 50 U/L (ref 39–117)
BUN: 11 mg/dL (ref 6–23)
CALCIUM: 9.2 mg/dL (ref 8.4–10.5)
CHLORIDE: 103 meq/L (ref 96–112)
CO2: 31 mEq/L (ref 19–32)
CREATININE: 0.79 mg/dL (ref 0.40–1.50)
GFR: 101.71 mL/min (ref >60.00)
Glucose, Bld: 119 mg/dL — ABNORMAL HIGH (ref 70–99)
POTASSIUM: 4.2 meq/L (ref 3.5–5.1)
SODIUM: 140 meq/L (ref 135–145)
TOTAL PROTEIN: 6.3 g/dL (ref 6.0–8.3)
Total Bilirubin: 0.6 mg/dL (ref 0.2–1.2)

## 2018-01-11 LAB — LIPID PANEL
CHOL/HDL RATIO: 3
CHOLESTEROL: 111 mg/dL (ref 0–200)
HDL: 44.5 mg/dL (ref >39.00)
NonHDL: 66.89
TRIGLYCERIDES: 309 mg/dL — AB (ref 0.0–149.0)
VLDL: 61.8 mg/dL — AB (ref 0.0–40.0)

## 2018-01-11 LAB — CBC WITH DIFFERENTIAL/PLATELET
Basophils Absolute: 0 10*3/uL (ref 0.0–0.1)
Basophils Relative: 0.8 % (ref 0.0–3.0)
EOS ABS: 0.6 10*3/uL (ref 0.0–0.7)
Eosinophils Relative: 10 % — ABNORMAL HIGH (ref 0.0–5.0)
HEMATOCRIT: 44.6 % (ref 39.0–52.0)
HEMOGLOBIN: 15.4 g/dL (ref 13.0–17.0)
LYMPHS PCT: 23 % (ref 12.0–46.0)
Lymphs Abs: 1.3 10*3/uL (ref 0.7–4.0)
MCHC: 34.4 g/dL (ref 30.0–36.0)
MCV: 94.7 fl (ref 78.0–100.0)
MONO ABS: 0.5 10*3/uL (ref 0.1–1.0)
Monocytes Relative: 9.3 % (ref 3.0–12.0)
Neutro Abs: 3.2 10*3/uL (ref 1.4–7.7)
Neutrophils Relative %: 56.9 % (ref 43.0–77.0)
Platelets: 124 10*3/uL — ABNORMAL LOW (ref 150.0–400.0)
RBC: 4.71 Mil/uL (ref 4.22–5.81)
RDW: 13.6 % (ref 11.5–15.5)
WBC: 5.7 10*3/uL (ref 4.0–10.5)

## 2018-01-11 LAB — PSA, MEDICARE: PSA: 1.3 ng/mL (ref 0.10–4.00)

## 2018-01-11 LAB — TSH: TSH: 1.63 u[IU]/mL (ref 0.35–4.50)

## 2018-01-11 LAB — LDL CHOLESTEROL, DIRECT: LDL DIRECT: 34 mg/dL

## 2018-01-11 LAB — HEMOGLOBIN A1C: Hgb A1c MFr Bld: 6 % (ref 4.6–6.5)

## 2018-01-16 ENCOUNTER — Ambulatory Visit: Payer: PPO | Admitting: Anesthesiology

## 2018-01-16 ENCOUNTER — Encounter: Payer: Self-pay | Admitting: *Deleted

## 2018-01-16 ENCOUNTER — Other Ambulatory Visit: Payer: Self-pay

## 2018-01-16 ENCOUNTER — Ambulatory Visit
Admission: RE | Admit: 2018-01-16 | Discharge: 2018-01-16 | Disposition: A | Discharge status: Home / Self Care | Payer: PPO | Source: Ambulatory Visit | Attending: Gastroenterology | Admitting: Gastroenterology

## 2018-01-16 ENCOUNTER — Encounter
Admission: RE | Disposition: A | Discharge status: Home / Self Care | Payer: Self-pay | Source: Ambulatory Visit | Attending: Gastroenterology

## 2018-01-16 DIAGNOSIS — Z85828 Personal history of other malignant neoplasm of skin: Secondary | ICD-10-CM | POA: Insufficient documentation

## 2018-01-16 DIAGNOSIS — Z8673 Personal history of transient ischemic attack (TIA), and cerebral infarction without residual deficits: Secondary | ICD-10-CM | POA: Insufficient documentation

## 2018-01-16 DIAGNOSIS — E785 Hyperlipidemia, unspecified: Secondary | ICD-10-CM | POA: Insufficient documentation

## 2018-01-16 DIAGNOSIS — Z1211 Encounter for screening for malignant neoplasm of colon: Secondary | ICD-10-CM | POA: Insufficient documentation

## 2018-01-16 DIAGNOSIS — Z87891 Personal history of nicotine dependence: Secondary | ICD-10-CM | POA: Diagnosis not present

## 2018-01-16 DIAGNOSIS — K6389 Other specified diseases of intestine: Secondary | ICD-10-CM | POA: Diagnosis not present

## 2018-01-16 DIAGNOSIS — M109 Gout, unspecified: Secondary | ICD-10-CM | POA: Diagnosis not present

## 2018-01-16 DIAGNOSIS — Z79899 Other long term (current) drug therapy: Secondary | ICD-10-CM | POA: Insufficient documentation

## 2018-01-16 DIAGNOSIS — M1990 Unspecified osteoarthritis, unspecified site: Secondary | ICD-10-CM | POA: Insufficient documentation

## 2018-01-16 DIAGNOSIS — Z86010 Personal history of colonic polyps: Secondary | ICD-10-CM | POA: Diagnosis not present

## 2018-01-16 DIAGNOSIS — D123 Benign neoplasm of transverse colon: Secondary | ICD-10-CM | POA: Insufficient documentation

## 2018-01-16 DIAGNOSIS — K635 Polyp of colon: Secondary | ICD-10-CM | POA: Diagnosis not present

## 2018-01-16 DIAGNOSIS — I739 Peripheral vascular disease, unspecified: Secondary | ICD-10-CM | POA: Insufficient documentation

## 2018-01-16 DIAGNOSIS — I10 Essential (primary) hypertension: Secondary | ICD-10-CM | POA: Diagnosis not present

## 2018-01-16 HISTORY — PX: COLONOSCOPY WITH PROPOFOL: SHX5780

## 2018-01-16 SURGERY — COLONOSCOPY WITH PROPOFOL
Anesthesia: General

## 2018-01-16 MED ORDER — PROPOFOL 500 MG/50ML IV EMUL
INTRAVENOUS | Status: AC
  Filled 2018-01-16: qty 50

## 2018-01-16 MED ORDER — FENTANYL CITRATE (PF) 100 MCG/2ML IJ SOLN
INTRAMUSCULAR | Status: AC
  Filled 2018-01-16: qty 2

## 2018-01-16 MED ORDER — PROPOFOL 10 MG/ML IV BOLUS
INTRAVENOUS | Status: AC
  Filled 2018-01-16: qty 20

## 2018-01-16 MED ORDER — SODIUM CHLORIDE 0.9 % IV SOLN
INTRAVENOUS | Status: DC
  Administered 2018-01-16 (×2): via INTRAVENOUS

## 2018-01-16 MED ORDER — LIDOCAINE HCL (CARDIAC) 20 MG/ML IV SOLN
INTRAVENOUS | Status: DC | PRN
  Administered 2018-01-16: 60 mg via INTRAVENOUS

## 2018-01-16 MED ORDER — SODIUM CHLORIDE 0.9 % IV SOLN: INTRAVENOUS | Status: DC

## 2018-01-16 MED ORDER — PROPOFOL 10 MG/ML IV BOLUS
INTRAVENOUS | Status: DC | PRN
Start: 2018-01-16 — End: 2018-01-16
  Administered 2018-01-16: 50 mg via INTRAVENOUS
  Administered 2018-01-16: 30 mg via INTRAVENOUS
  Administered 2018-01-16: 20 mg via INTRAVENOUS

## 2018-01-16 MED ORDER — MIDAZOLAM HCL 2 MG/2ML IJ SOLN
INTRAMUSCULAR | Status: AC
  Filled 2018-01-16: qty 2

## 2018-01-16 MED ORDER — PROPOFOL 500 MG/50ML IV EMUL
INTRAVENOUS | Status: DC | PRN
  Administered 2018-01-16: 120 ug/kg/min via INTRAVENOUS

## 2018-01-16 MED ORDER — MIDAZOLAM HCL 2 MG/2ML IJ SOLN
INTRAMUSCULAR | Status: DC | PRN
  Administered 2018-01-16: 1 mg via INTRAVENOUS

## 2018-01-16 NOTE — Anesthesia Preprocedure Evaluation (Signed)
Anesthesia Evaluation  Patient identified by MRN, date of birth, ID band Patient awake    Reviewed: Allergy & Precautions, NPO status , Patient's Chart, lab work & pertinent test results, reviewed documented beta blocker date and time   Airway Mallampati: II  TM Distance: >3 FB     Dental  (+) Chipped   Pulmonary former smoker,           Cardiovascular hypertension, Pt. on medications + Peripheral Vascular Disease       Neuro/Psych CVA    GI/Hepatic   Endo/Other    Renal/GU      Musculoskeletal  (+) Arthritis ,   Abdominal   Peds  Hematology   Anesthesia Other Findings Denies stroke.  Reproductive/Obstetrics                             Anesthesia Physical Anesthesia Plan  ASA: III  Anesthesia Plan: General   Post-op Pain Management:    Induction: Intravenous  PONV Risk Score and Plan:   Airway Management Planned:   Additional Equipment:   Intra-op Plan:   Post-operative Plan:   Informed Consent: I have reviewed the patients History and Physical, chart, labs and discussed the procedure including the risks, benefits and alternatives for the proposed anesthesia with the patient or authorized representative who has indicated his/her understanding and acceptance.     Plan Discussed with: CRNA  Anesthesia Plan Comments:         Anesthesia Quick Evaluation

## 2018-01-16 NOTE — Anesthesia Postprocedure Evaluation (Signed)
Anesthesia Post Note  Patient: Jeffrey [name]  Procedure(s) Performed: COLONOSCOPY WITH PROPOFOL (N/A )  Patient location during evaluation: Endoscopy Anesthesia Type: General Level of consciousness: awake and alert Pain management: pain level controlled Vital Signs Assessment: post-procedure vital signs reviewed and stable Respiratory status: spontaneous breathing, nonlabored ventilation, respiratory function stable and patient connected to nasal cannula oxygen Cardiovascular status: blood pressure returned to baseline and stable Postop Assessment: no apparent nausea or vomiting Anesthetic complications: no     Last Vitals:  Vitals:   01/16/18 1531 01/16/18 1541  BP: 126/83 125/82  Pulse: 62 (!) 58  Resp: 16 18  Temp:    SpO2: 98% 99%    Last Pain:  Vitals:   01/16/18 1541  TempSrc:   PainSc: 0-No pain                 Martha Clan

## 2018-01-16 NOTE — H&P (Signed)
Outpatient short stay form Pre-procedure 01/16/2018 2:20 PM Jeffrey Sails MD  Primary Physician: Dr. Rica [name]  Reason for visit: Colonoscopy  History of present illness: Patient is a 77 year old [name] presenting today as above.  He has a personal history of adenomatous colon polyps.  Patient also has a history of stroke several years ago and does take 481 mg aspirins daily.  He has held those for about 5 days.  He takes no other aspirin or blood thinning agent.  He tolerated his prep well.    Current Facility-Administered Medications:  .  0.9 %  sodium chloride infusion, , Intravenous, Continuous, Jeffrey Sails, MD, Last Rate: 20 mL/hr at 01/16/18 1253 .  0.9 %  sodium chloride infusion, , Intravenous, Continuous, Jeffrey Sails, MD  Medications Prior to Admission  Medication Sig Dispense Refill Last Dose  . allopurinol (ZYLOPRIM) 300 MG tablet TAKE 1 TABLET(300 MG) BY MOUTH DAILY AT NIGHT 90 tablet 3 Past Week at Unknown time  . Ascorbic Acid (VITAMIN C) 1000 MG tablet Take 1,000 mg by mouth daily.     Past Week at Unknown time  . aspirin EC Jeffrey MG tablet Take 162 mg by mouth 2 (two) times daily.   Past Week at Unknown time  . atorvastatin (LIPITOR) 20 MG tablet TAKE 1 TABLET(20 MG) BY MOUTH DAILY 90 tablet 0 Past Week at Unknown time  . bisoprolol (ZEBETA) 5 MG tablet TAKE 1 AND 1/2 TABLETS(7.5 MG) BY MOUTH DAILY 135 tablet 1 01/16/2018 at 0700  . calcium carbonate (TUMS EX) 750 MG chewable tablet Chew 1 tablet by mouth every 2 (two) hours as needed for heartburn.   Past Week at Unknown time  . Calcium-Magnesium-Zinc 1000-400-15 MG TABS Take 1 tablet by mouth daily.     Past Week at Unknown time  . lisinopril (PRINIVIL,ZESTRIL) 20 MG tablet TAKE 1 TABLET(20 MG) BY MOUTH DAILY 90 tablet 1 Past Week at Unknown time  . Multiple Vitamin (MULTIVITAMIN) tablet Take 1 tablet by mouth daily.   Past Week at Unknown time  . Omega-3 Fatty Acids (FISH OIL PO) Take 1 capsule by mouth  daily.    Past Week at Unknown time  . potassium chloride (K-DUR) 10 MEQ tablet Take 10 mEq by mouth daily.   Past Week at Unknown time  . VITAMIN E PO Take 1 capsule by mouth daily.    Past Week at Unknown time  . hydrochlorothiazide (HYDRODIURIL) 12.5 MG tablet Take 1 tablet (12.5 mg total) by mouth 2 (two) times daily. (Patient not taking: Reported on 01/16/2018) 180 tablet 3 Not Taking at Unknown time     No Known Allergies   Past Medical History:  Diagnosis Date  . DDD (degenerative disc disease)    with epidural injection  . Gastritis    H pylori, partial tx.  EGD 04/2002  . Gout   . History of skin cancer   . Hyperlipidemia   . Hypertension    some white coat component- better at home  . Stroke [facility])     Review of systems:      Physical Exam    Heart and lungs: Regular rate and rhythm without rub or gallop, lungs are bilaterally clear.    HEENT: Normocephalic atraumatic eyes are anicteric    Other:    Pertinant exam for procedure: Soft nontender nondistended bowel sounds positive normoactive.    Planned proceedures: Anoscopy and indicated procedures. I have discussed the risks benefits and complications of procedures to  include not limited to bleeding, infection, perforation and the risk of sedation and the patient wishes to proceed.    Jeffrey Sails, MD Gastroenterology 01/16/2018  2:20 PM

## 2018-01-16 NOTE — Transfer of Care (Signed)
Immediate Anesthesia Transfer of Care Note  Patient: Jeffrey [name]  Procedure(s) Performed: COLONOSCOPY WITH PROPOFOL (N/A )  Patient Location: PACU  Anesthesia Type:General  Level of Consciousness: awake  Airway & Oxygen Therapy: Patient Spontanous Breathing and Patient connected to nasal cannula oxygen  Post-op Assessment: Report given to RN and Post -op Vital signs reviewed and stable  Post vital signs: Reviewed and stable  Last Vitals:  Vitals Value Taken Time  BP 113/67 01/16/2018  3:21 PM  Temp 36.1 C 01/16/2018  3:21 PM  Pulse 65 01/16/2018  3:24 PM  Resp 19 01/16/2018  3:24 PM  SpO2 100 % 01/16/2018  3:24 PM  Vitals shown include unvalidated device data.  Last Pain:  Vitals:   01/16/18 1521  TempSrc: Tympanic  PainSc: 0-No pain         Complications: No apparent anesthesia complications

## 2018-01-16 NOTE — Anesthesia Post-op Follow-up Note (Signed)
Anesthesia QCDR form completed.        

## 2018-01-16 NOTE — Anesthesia Procedure Notes (Signed)
Performed by: Girtrude Enslin, CRNA       

## 2018-01-16 NOTE — Op Note (Signed)
[facility] Patient Name: Jeffrey [name] Procedure Date: 01/16/2018 2:21 PM MRN: [id number] Account #: [id number] Date of Birth: 06-04-1941 Admit Type: Outpatient Age: 77 Room: [facility] ENDO ROOM 3 Gender: Male Note Status: Finalized Procedure:            Colonoscopy Indications:          Personal history of colonic polyps Providers:            Lollie Sails, MD Referring MD:         Wynelle Fanny. Tower (Referring MD) Medicines:            Monitored Anesthesia Care Complications:        No immediate complications. Procedure:            Pre-Anesthesia Assessment:                       - ASA Grade Assessment: III - A patient with severe                        systemic disease.                       After obtaining informed consent, the colonoscope was                        passed under direct vision. Throughout the procedure,                        the patient's blood pressure, pulse, and oxygen                        saturations were monitored continuously. The                        Colonoscope was introduced through the anus and                        advanced to the the cecum, identified by appendiceal                        orifice and ileocecal valve. The colonoscopy was                        unusually difficult due to a redundant colon.                        Successful completion of the procedure was aided by                        changing the patient to a supine position, changing the                        patient to a prone position and using manual pressure.                        The quality of the bowel preparation was good. Findings:      A 3 mm polyp was found in the hepatic flexure. The polyp was sessile.       The polyp was removed with a cold biopsy forceps. Resection and  retrieval were complete.      A diffuse area of moderate melanosis was found in the entire colon.       Biopsies were taken with a cold forceps for histology  from the left       colon. .      The retroflexed view of the distal rectum and anal verge was normal and       showed no anal or rectal abnormalities.      The digital rectal exam was normal. Impression:           - One 3 mm polyp at the hepatic flexure, removed with a                        cold biopsy forceps. Resected and retrieved.                       - Melanosis in the colon. Biopsied.                       - The distal rectum and anal verge are normal on                        retroflexion view. Recommendation:       - Discharge patient to home. Procedure Code(s):    --- Professional ---                       [phone], Colonoscopy, flexible; with biopsy, single or                        multiple Diagnosis Code(s):    --- Professional ---                       D12.3, Benign neoplasm of transverse colon (hepatic                        flexure or splenic flexure)                       K63.89, Other specified diseases of intestine                       Z86.010, Personal history of colonic polyps CPT copyright 2017 [employer]. All rights reserved. The codes documented in this report are preliminary and upon coder review may  be revised to meet current compliance requirements. Lollie Sails, MD 01/16/2018 3:17:53 PM This report has been signed electronically. Number of Addenda: 0 Note Initiated On: 01/16/2018 2:21 PM Scope Withdrawal Time: 0 hours 10 minutes 10 seconds  Total Procedure Duration: 0 hours 43 minutes 5 seconds       [facility]

## 2018-01-17 ENCOUNTER — Encounter: Payer: Self-pay | Admitting: Gastroenterology

## 2018-01-18 LAB — SURGICAL PATHOLOGY

## 2018-01-23 ENCOUNTER — Encounter: Payer: Self-pay | Admitting: Family Medicine

## 2018-01-23 ENCOUNTER — Ambulatory Visit (INDEPENDENT_AMBULATORY_CARE_PROVIDER_SITE_OTHER): Payer: PPO | Admitting: Family Medicine

## 2018-01-23 ENCOUNTER — Ambulatory Visit (INDEPENDENT_AMBULATORY_CARE_PROVIDER_SITE_OTHER): Payer: PPO | Admitting: *Deleted

## 2018-01-23 ENCOUNTER — Ambulatory Visit (INDEPENDENT_AMBULATORY_CARE_PROVIDER_SITE_OTHER): Payer: PPO

## 2018-01-23 VITALS — BP 124/86 | HR 55 | Temp 97.9°F | Ht 72.0 in | Wt 182.0 lb

## 2018-01-23 DIAGNOSIS — M109 Gout, unspecified: Secondary | ICD-10-CM | POA: Diagnosis not present

## 2018-01-23 DIAGNOSIS — R739 Hyperglycemia, unspecified: Secondary | ICD-10-CM | POA: Diagnosis not present

## 2018-01-23 DIAGNOSIS — Z800 Family history of malignant neoplasm of digestive organs: Secondary | ICD-10-CM

## 2018-01-23 DIAGNOSIS — I639 Cerebral infarction, unspecified: Secondary | ICD-10-CM | POA: Diagnosis not present

## 2018-01-23 DIAGNOSIS — I77810 Thoracic aortic ectasia: Secondary | ICD-10-CM | POA: Diagnosis not present

## 2018-01-23 DIAGNOSIS — I10 Essential (primary) hypertension: Secondary | ICD-10-CM

## 2018-01-23 DIAGNOSIS — Z1211 Encounter for screening for malignant neoplasm of colon: Secondary | ICD-10-CM

## 2018-01-23 DIAGNOSIS — E7800 Pure hypercholesterolemia, unspecified: Secondary | ICD-10-CM

## 2018-01-23 DIAGNOSIS — D696 Thrombocytopenia, unspecified: Secondary | ICD-10-CM | POA: Diagnosis not present

## 2018-01-23 DIAGNOSIS — Z0000 Encounter for general adult medical examination without abnormal findings: Secondary | ICD-10-CM | POA: Diagnosis not present

## 2018-01-23 DIAGNOSIS — Z125 Encounter for screening for malignant neoplasm of prostate: Secondary | ICD-10-CM

## 2018-01-23 MED ORDER — ATORVASTATIN CALCIUM 20 MG PO TABS: ORAL_TABLET | ORAL | 3 refills | Status: DC

## 2018-01-23 MED ORDER — LISINOPRIL 20 MG PO TABS: 20.0000 mg | ORAL_TABLET | Freq: Every day | ORAL | 3 refills | Status: DC

## 2018-01-23 MED ORDER — ALLOPURINOL 300 MG PO TABS: ORAL_TABLET | ORAL | 3 refills | Status: DC

## 2018-01-23 MED ORDER — BISOPROLOL FUMARATE 5 MG PO TABS: ORAL_TABLET | ORAL | 3 refills | Status: DC

## 2018-01-23 NOTE — Progress Notes (Signed)
Carelink Summary Report / Loop Recorder 

## 2018-01-23 NOTE — Assessment & Plan Note (Signed)
Small adenoma on colonoscopy this mo

## 2018-01-23 NOTE — Assessment & Plan Note (Signed)
No flares On allopurinol Stable labs Lab Results  Component Value Date   LABURIC 7.0 01/11/2018

## 2018-01-23 NOTE — Assessment & Plan Note (Signed)
Doing well on statin/bp med and asa  No further symptoms  Has loop recorder-nothing found

## 2018-01-23 NOTE — Assessment & Plan Note (Signed)
Disc goals for lipids and reasons to control them Rev labs with pt Rev low sat fat diet in detail Good control with statin and diet  Trig up (not fasting for sample) Hx of CVA Continue to watch

## 2018-01-23 NOTE — Patient Instructions (Addendum)
Jeffrey [name] , Thank you for taking time to come for your Medicare Wellness Visit. I appreciate your ongoing commitment to your health goals. Please review the following plan we discussed and let me know if I can assist you in the future.   These are the goals we discussed: Goals    . Have 3 meals a day     Starting 01/23/2018, I will continue to make a healthy protein choice for breakfast in order to avoid skipping meals.        This is a list of the screening recommended for you and due dates:  Health Maintenance  Topic Date Due  . Flu Shot  05/11/2018  . Tetanus Vaccine  08/02/2023  . Colon Cancer Screening  01/17/2028  . Pneumonia vaccines  Completed   Preventive Care for Adults  A healthy lifestyle and preventive care can promote health and wellness. Preventive health guidelines for adults include the following key practices.  . A routine yearly physical is a good way to check with your health care provider about your health and preventive screening. It is a chance to share any concerns and updates on your health and to receive a thorough exam.  . Visit your dentist for a routine exam and preventive care every 6 months. Brush your teeth twice a day and floss once a day. Good oral hygiene prevents tooth decay and gum disease.  . The frequency of eye exams is based on your age, health, family medical history, use  of contact lenses, and other factors. Follow your health care provider's recommendations for frequency of eye exams.  . Eat a healthy diet. Foods like vegetables, fruits, whole grains, low-fat dairy products, and lean protein foods contain the nutrients you need without too many calories. Decrease your intake of foods high in solid fats, added sugars, and salt. Eat the right amount of calories for you. Get information about a proper diet from your health care provider, if necessary.  . Regular physical exercise is one of the most important things you can do for your health.  Most adults should get at least 150 minutes of moderate-intensity exercise (any activity that increases your heart rate and causes you to sweat) each week. In addition, most adults need muscle-strengthening exercises on 2 or more days a week.  Silver Sneakers may be a benefit available to you. To determine eligibility, you may visit the website: www.silversneakers.com or contact program at 705-361-7686 Mon-Fri between 8AM-8PM.   . Maintain a healthy weight. The body mass index (BMI) is a screening tool to identify possible weight problems. It provides an estimate of body fat based on height and weight. Your health care provider can find your BMI and can help you achieve or maintain a healthy weight.   For adults 20 years and older: ? A BMI below 18.5 is considered underweight. ? A BMI of 18.5 to 24.9 is normal. ? A BMI of 25 to 29.9 is considered overweight. ? A BMI of 30 and above is considered obese.   . Maintain normal blood lipids and cholesterol levels by exercising and minimizing your intake of saturated fat. Eat a balanced diet with plenty of fruit and vegetables. Blood tests for lipids and cholesterol should begin at age 40 and be repeated every 5 years. If your lipid or cholesterol levels are high, you are over 50, or you are at high risk for heart disease, you may need your cholesterol levels checked more frequently. Ongoing high lipid and cholesterol  levels should be treated with medicines if diet and exercise are not working.  . If you smoke, find out from your health care provider how to quit. If you do not use tobacco, please do not start.  . If you choose to drink alcohol, please do not consume more than 2 drinks per day. One drink is considered to be 12 ounces (355 mL) of beer, 5 ounces (148 mL) of wine, or 1.5 ounces (44 mL) of liquor.  . If you are 74-46 years old, ask your health care provider if you should take aspirin to prevent strokes.  . Use sunscreen. Apply sunscreen  liberally and repeatedly throughout the day. You should seek shade when your shadow is shorter than you. Protect yourself by wearing long sleeves, pants, a wide-brimmed hat, and sunglasses year round, whenever you are outdoors.  . Once a month, do a whole body skin exam, using a mirror to look at the skin on your back. Tell your health care provider of new moles, moles that have irregular borders, moles that are larger than a pencil eraser, or moles that have changed in shape or color.

## 2018-01-23 NOTE — Progress Notes (Signed)
PCP notes:   Health maintenance:  No gaps identified.   Abnormal screenings:   Hearing - failed  Hearing Screening   125Hz  250Hz  500Hz  1000Hz  2000Hz  3000Hz  4000Hz  6000Hz  8000Hz   Right ear:   40 40 40  0    Left ear:   40 40 40  0     Mini-Cog score: 19/20 MMSE - Mini Mental State Exam 01/23/2018 01/18/2017 01/21/2016  Orientation to time 5 5 5   Orientation to Place 5 5 5   Registration 3 3 3   Attention/ Calculation 0 0 0  Recall 2 3 3   Recall-comments unable to recall 1 of 3 words - -  Language- name 2 objects 0 0 0  Language- repeat 1 1 1   Language- follow 3 step command 3 3 3   Language- read & follow direction 0 0 0  Write a sentence 0 0 0  Copy design 0 0 0  Total score 19 20 20     Patient concerns:   None  Nurse concerns:  None  Next PCP appt:   01/23/18 @ 0930  I reviewed health advisor's note, was available for consultation, and agree with documentation and plan. Loura Pardon MD

## 2018-01-23 NOTE — Patient Instructions (Addendum)
If you are interested in the new shingles vaccine (Shingrix) - call your local pharmacy to check on coverage and availability  Get on a waiting list at your pharmacy if it is affordable     Keep exercising  Keep watching sugar and fat in diet  Wear sun protection when out to prevent skin cancer   Labs are fairly stable

## 2018-01-23 NOTE — Assessment & Plan Note (Signed)
Reviewed health habits including diet and exercise and skin cancer prevention Reviewed appropriate screening tests for age  Also reviewed health mt list, fam hx and immunization status , as well as social and family history   See HPI Rev amw-disc hearing and memory  Good health habits  Labs reviewed  Disc Shingrix- he will call pharmacy to get on list if affordable

## 2018-01-23 NOTE — Progress Notes (Signed)
Subjective:    Patient ID: Jeffrey [name], male    DOB: Jan 16, 1941, 77 y.o.   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    [employer] from Last 3 Encounters:  01/23/18 182 lb (82.6 kg)  01/23/18 182 lb (82.6 kg)  01/16/18 177 lb (80.3 kg)  24.68 kg/m   Exercise- walking/running/ tennis    Colonoscopy 4/19-one small polyp (adenoma)- unsure if he will have a recall  No bowel changes  Sister had colon cancer   Prostate health  Lab Results  Component Value Date   PSA 1.30 01/11/2018   PSA 1.07 01/17/2017   PSA 7.37 (H) 07/21/2016   remote hx of prostatitis-saw urology No more episodes  Nocturia is usually once  No problems with stream or frequency   Zoster status -will check into cov of shingrix   bp is stable today  No cp or palpitations or headaches or edema  No side effects to medicines  BP Readings from Last 3 Encounters:  01/23/18 124/86  01/23/18 124/86  01/16/18 125/82   Lab Results  Component Value Date   CREATININE 0.79 01/11/2018   BUN 11 01/11/2018   NA 140 01/11/2018   K 4.2 01/11/2018   CL 103 01/11/2018   CO2 31 01/11/2018   Glucose was 119 -non fasting  Lab Results  Component Value Date   ALT 46 01/11/2018   AST 39 (H) 01/11/2018   ALKPHOS 50 01/11/2018   BILITOT 0.6 01/11/2018    Lab Results  Component Value Date   TSH 1.63 01/11/2018       Hx of CVA -on statin and ASA  Loop recorder  No recurrence  No findings from loop recorder   Hx of thrombocytopenia Lab Results  Component Value Date   WBC 5.7 01/11/2018   HGB 15.4 01/11/2018   HCT 44.6 01/11/2018   MCV 94.7 01/11/2018   PLT 124.0 (L) 01/11/2018  no bruising or bleeding    Hyperlipidemia Lab  Results  Component Value Date   CHOL 111 01/11/2018   CHOL 101 01/10/2017   CHOL 148 01/12/2016   Lab Results  Component Value Date   HDL 44.50 01/11/2018   HDL 38.00 (L) 01/10/2017   HDL 51.50 01/12/2016   Lab Results  Component Value Date   LDLCALC 25 01/10/2017   LDLCALC 65 01/12/2016   LDLCALC 83 12/16/2010   Lab Results  Component Value Date   TRIG 309.0 (H) 01/11/2018   TRIG 189.0 (H) 01/10/2017   TRIG 157.0 (H) 01/12/2016   Lab Results  Component Value Date   CHOLHDL 3 01/11/2018   CHOLHDL 3 01/10/2017   CHOLHDL 3 01/12/2016   Lab Results  Component Value Date   LDLDIRECT 34.0 01/11/2018   LDLDIRECT 55.0 02/10/2015   LDLDIRECT 64.0 10/30/2014  statin and diet  Was not fasting for labs- trig were up  He eats very healthy- no fried foods occ red meat     Hx of hyperglycemia Lab Results  Component Value Date   HGBA1C 6.0 01/11/2018  down from 6.3 Eating less sugar/carbs and exercise    Takes allopurinol for gout prev Lab Results  Component Value Date   LABURIC  7.0 01/11/2018  no symptoms    Patient Active Problem List   Diagnosis Date Noted  . Ascending aorta dilation ([place]) 02/22/2017  . Cryptogenic stroke ([place]) 11/08/2016  . Blurred vision 11/05/2014  . Prostate cancer screening 11/05/2014  . Family history of colon cancer 08/01/2013  . Colon cancer screening 08/01/2013  . Routine general medical examination at a health care facility 08/01/2013  . PSA, INCREASED 09/07/2010  . 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 Thrombocytopenia ([place]) 01/16/2009  . Hyperlipidemia 06/08/2007  . Gout 06/08/2007  . Essential hypertension 06/08/2007  . Hyperglycemia 06/08/2007  . SKIN CANCER, HX OF 06/08/2007   Past Medical History:  Diagnosis Date  . DDD (degenerative disc disease)    with epidural injection  . Gastritis    H pylori, partial tx.  EGD 04/2002  . Gout   . History of skin cancer   . Hyperlipidemia   .  Hypertension    some white coat component- better at home  . Stroke [facility])    Past Surgical History:  Procedure Laterality Date  . COLONOSCOPY WITH PROPOFOL N/A 01/16/2018   Procedure: COLONOSCOPY WITH PROPOFOL;  Surgeon: Lollie Sails, MD;  Location[facility] ENDOSCOPY;  Service: Endoscopy;  Laterality: N/A;  . [place]  . LOOP RECORDER INSERTION N/A 12/21/2016   Procedure: Loop Recorder Insertion;  Surgeon: Deboraha Sprang, MD;  Location: [place] CV LAB;  Service: Cardiovascular;  Laterality: N/A;  . TEE WITHOUT CARDIOVERSION N/A 12/21/2016   Procedure: TRANSESOPHAGEAL ECHOCARDIOGRAM (TEE);  Surgeon: Nelva Bush, MD;  Location: [facility] ORS;  Service: Cardiovascular;  Laterality: N/A;   Social History   Tobacco Use  . Smoking status: Former Smoker    Packs/day: 1.00    Years: 8.00    Pack years: 8.00    Types: Cigarettes    Last attempt to quit: 1973    Years since quitting: 46.3  . Smokeless tobacco: Never Used  Substance Use Topics  . Alcohol use: Yes    Alcohol/week: 4.2 oz    Types: 7 Glasses of wine per week  . Drug use: No   Family History  Problem Relation Age of Onset  . Other Mother        brain tumor  . Skin cancer Mother        suspect melanoma  . Heart attack Father 57  . Colon cancer Sister    No Known Allergies Current Outpatient Medications on File Prior to Visit  Medication Sig Dispense Refill  . Ascorbic Acid (VITAMIN C) 1000 MG tablet Take 1,000 mg by mouth daily.      Marland Kitchen aspirin EC 81 MG tablet Take 162 mg by mouth 2 (two) times daily.    . calcium carbonate (TUMS EX) 750 MG chewable tablet Chew 1 tablet by mouth every 2 (two) hours as needed for heartburn.    . Calcium-Magnesium-Zinc 1000-400-15 MG TABS Take 1 tablet by mouth daily.      . hydrochlorothiazide (HYDRODIURIL) 12.5 MG tablet Take 1 tablet (12.5 mg total) by mouth 2 (two) times daily. 180 tablet 3  . Multiple Vitamin (MULTIVITAMIN) tablet Take 1 tablet by mouth daily.     . Omega-3 Fatty Acids (FISH OIL PO) Take 1 capsule by mouth daily.     . potassium chloride (K-DUR) 10 MEQ tablet Take 10 mEq by mouth daily.    Marland Kitchen VITAMIN E PO Take 1 capsule by mouth daily.      No  current facility-administered medications on file prior to visit.      Review of Systems  Constitutional: Negative for activity change, appetite change, fatigue, fever and unexpected weight change.  HENT: Negative for congestion, rhinorrhea, sore throat and trouble swallowing.   Eyes: Negative for pain, redness, itching and visual disturbance.  Respiratory: Negative for cough, chest tightness, shortness of breath and wheezing.   Cardiovascular: Negative for chest pain and palpitations.  Gastrointestinal: Negative for abdominal pain, blood in stool, constipation, diarrhea and nausea.  Endocrine: Negative for cold intolerance, heat intolerance, polydipsia and polyuria.  Genitourinary: Negative for difficulty urinating, dysuria, frequency and urgency.       Pos for erection problems since his CVA-not that bothersome   Musculoskeletal: Negative for arthralgias, joint swelling and myalgias.  Skin: Negative for pallor and rash.  Neurological: Negative for dizziness, tremors, weakness, numbness and headaches.       No new stroke symptoms   Doing well with speech  Hematological: Negative for adenopathy. Does not bruise/bleed easily.  Psychiatric/Behavioral: Negative for decreased concentration and dysphoric mood. The patient is not nervous/anxious.        Objective:   Physical Exam  Constitutional: He appears well-developed and well-nourished. No distress.  Well appearing   HENT:  Head: Normocephalic and atraumatic.  Right Ear: External ear normal.  Left Ear: External ear normal.  Nose: Nose normal.  Mouth/Throat: Oropharynx is clear and moist.  Scant cerumen in R ear   Eyes: Pupils are equal, round, and reactive to light. Conjunctivae and EOM are normal. Right eye exhibits no discharge.  Left eye exhibits no discharge. No scleral icterus.  Neck: Normal range of motion. Neck supple. No JVD present. Carotid bruit is not present. No thyromegaly present.  Cardiovascular: Normal rate, regular rhythm, normal heart sounds and intact distal pulses. Exam reveals no gallop.  Pulmonary/Chest: Effort normal and breath sounds normal. No respiratory distress. He has no wheezes. He exhibits no tenderness.  Abdominal: Soft. Bowel sounds are normal. He exhibits no distension, no abdominal bruit and no mass. There is no tenderness.  Musculoskeletal: He exhibits no edema or tenderness.  No kyphosis No acute joint changes   Lymphadenopathy:    He has no cervical adenopathy.  Neurological: He is alert. He has normal reflexes. No cranial nerve deficit. He exhibits normal muscle tone. Coordination normal.  Speech is baseline  Facial droop-very mild/baseline    Skin: Skin is warm and dry. No rash noted. No erythema. No pallor.  Tanned Solar lentigines diffusely  Some solar aging  Psychiatric: He has a normal mood and affect.  Pleasant and talkative           Assessment & Plan:   Problem List Items Addressed This Visit      Cardiovascular and Mediastinum   Ascending aorta dilation (HCC)    Mild -on TEE No symptoms or change in exam  bp and cholesterol controlled       Relevant Medications   atorvastatin (LIPITOR) 20 MG tablet   bisoprolol (ZEBETA) 5 MG tablet   lisinopril (PRINIVIL,ZESTRIL) 20 MG tablet   Cryptogenic stroke (HCC)    Doing well on statin/bp med and asa  No further symptoms  Has loop recorder-nothing found      Relevant Medications   atorvastatin (LIPITOR) 20 MG tablet   bisoprolol (ZEBETA) 5 MG tablet   lisinopril (PRINIVIL,ZESTRIL) 20 MG tablet   Essential hypertension    bp in fair control at this time  BP Readings from Last 1  Encounters:  01/23/18 124/86   No changes needed Disc lifstyle change with low sodium diet and exercise  Hctz, lisinopril  and K and Zebeta  Some medication may add to his ED symptoms       Relevant Medications   atorvastatin (LIPITOR) 20 MG tablet   bisoprolol (ZEBETA) 5 MG tablet   lisinopril (PRINIVIL,ZESTRIL) 20 MG tablet     Other   Colon cancer screening    Small adenoma on colonoscopy this mo       Family history of colon cancer    One adenoma on recent colonoscopy  No bowel changes Reassuring       Gout    No flares On allopurinol Stable labs Lab Results  Component Value Date   LABURIC 7.0 01/11/2018         Hyperglycemia    Lab Results  Component Value Date   HGBA1C 6.0 01/11/2018   This is down  Eating better diet overall and exercising  disc imp of low glycemic diet and wt loss to prevent DM2       Hyperlipidemia    Disc goals for lipids and reasons to control them Rev labs with pt Rev low sat fat diet in detail Good control with statin and diet  Trig up (not fasting for sample) Hx of CVA Continue to watch       Relevant Medications   atorvastatin (LIPITOR) 20 MG tablet   bisoprolol (ZEBETA) 5 MG tablet   lisinopril (PRINIVIL,ZESTRIL) 20 MG tablet   Prostate cancer screening    Lab Results  Component Value Date   PSA 1.30 01/11/2018   PSA 1.07 01/17/2017   PSA 7.37 (H) 07/21/2016   No further episodes of prostatitis Stable nocturia No symptoms       Routine general medical examination at a health care facility - Primary    Reviewed health habits including diet and exercise and skin cancer prevention Reviewed appropriate screening tests for age  Also reviewed health mt list, fam hx and immunization status , as well as social and family history   See HPI Rev amw-disc hearing and memory  Good health habits  Labs reviewed  Disc Shingrix- he will call pharmacy to get on list if affordable        Thrombocytopenia (HCC)    Platelet count is 124 No bruising/bleeding  Takes asa Continue to follow

## 2018-01-23 NOTE — Assessment & Plan Note (Signed)
One adenoma on recent colonoscopy  No bowel changes Reassuring

## 2018-01-23 NOTE — Assessment & Plan Note (Signed)
Lab Results  Component Value Date   PSA 1.30 01/11/2018   PSA 1.07 01/17/2017   PSA 7.37 (H) 07/21/2016   No further episodes of prostatitis Stable nocturia No symptoms

## 2018-01-23 NOTE — Assessment & Plan Note (Addendum)
Mild -on TEE No symptoms or change in exam  bp and cholesterol controlled

## 2018-01-23 NOTE — Assessment & Plan Note (Signed)
Lab Results  Component Value Date   HGBA1C 6.0 01/11/2018   This is down  Eating better diet overall and exercising  disc imp of low glycemic diet and wt loss to prevent DM2

## 2018-01-23 NOTE — Assessment & Plan Note (Signed)
bp in fair control at this time  BP Readings from Last 1 Encounters:  01/23/18 124/86   No changes needed Disc lifstyle change with low sodium diet and exercise  Hctz, lisinopril and K and Zebeta  Some medication may add to his ED symptoms

## 2018-01-23 NOTE — Progress Notes (Signed)
Subjective:   Jeffrey [name] is a 77 y.o. male who presents for Medicare Annual/Subsequent preventive examination.  Review of Systems:  N/A Cardiac Risk Factors include: advanced age (>10men, >44 women);male gender;dyslipidemia;hypertension     Objective:    Vitals: BP 124/86 (BP Location: Right Arm, Patient Position: Sitting, Cuff Size: Normal)   Pulse (!) 55   Temp 97.9 F (36.6 C) (Oral)   Ht 6' (1.829 m) Comment: no shoes  Wt 182 lb (82.6 kg)   SpO2 97%   BMI 24.68 kg/m   Body mass index is 24.68 kg/m.  Advanced Directives 01/23/2018 01/16/2018 01/18/2017 12/21/2016 12/01/2016 11/29/2016 11/24/2016  Does Patient Have a Medical Advance Directive? Yes Yes Yes Yes Yes Yes Yes  Type of Paramedic of [place];Living will [place];Living will [place];Living will [place];Living will [place];Living will [place];Living will [place];Living will  Does patient want to make changes to medical advance directive? - - - No - Patient declined - - -  Copy of [place] in Chart? No - copy requested No - copy requested No - copy requested No - copy requested Yes Yes Yes    Tobacco Social History   Tobacco Use  Smoking Status Former Smoker  . Packs/day: 1.00  . Years: 8.00  . Pack years: 8.00  . Types: Cigarettes  . Last attempt to quit: 1973  . Years since quitting: 46.3  Smokeless Tobacco Never Used     Counseling given: No   Clinical Intake:  Pre-visit preparation completed: Yes  Pain : No/denies pain Pain Score: 0-No pain     Nutritional Risks: Unintentional weight loss Diabetes: No  How often do you need to have someone help you when you read instructions, pamphlets, or other written materials from your doctor or pharmacy?: 1 - Never What is the last grade level you completed in school?: 12th  grade  Interpreter Needed?: No  Comments: pt lives with spouse Information entered by :: LPinson, LPN  Past Medical History:  Diagnosis Date  . DDD (degenerative disc disease)    with epidural injection  . Gastritis    H pylori, partial tx.  EGD 04/2002  . Gout   . History of skin cancer   . Hyperlipidemia   . Hypertension    some white coat component- better at home  . Stroke [facility])    Past Surgical History:  Procedure Laterality Date  . COLONOSCOPY WITH PROPOFOL N/A 01/16/2018   Procedure: COLONOSCOPY WITH PROPOFOL;  Surgeon: Lollie Sails, MD;  Location: [facility] ENDOSCOPY;  Service: Endoscopy;  Laterality: N/A;  . [place]  . LOOP RECORDER INSERTION N/A 12/21/2016   Procedure: Loop Recorder Insertion;  Surgeon: Deboraha Sprang, MD;  Location: [place] CV LAB;  Service: Cardiovascular;  Laterality: N/A;  . TEE WITHOUT CARDIOVERSION N/A 12/21/2016   Procedure: TRANSESOPHAGEAL ECHOCARDIOGRAM (TEE);  Surgeon: Nelva Bush, MD;  Location: [facility] ORS;  Service: Cardiovascular;  Laterality: N/A;   Family History  Problem Relation Age of Onset  . Other Mother        brain tumor  . Skin cancer Mother        suspect melanoma  . Heart attack Father 99  . Colon cancer Sister    Social History   Socioeconomic History  . Marital status: Married    Spouse name: Not on file  . Number of  children: 3  . Years of education: HS  . Highest education level: Not on file  Occupational History  . Occupation: [employer]  Social Needs  . Financial resource strain: Not on file  . Food insecurity:    Worry: Not on file    Inability: Not on file  . Transportation needs:    Medical: Not on file    Non-medical: Not on file  Tobacco Use  . Smoking status: Former Smoker    Packs/day: 1.00    Years: 8.00    Pack years: 8.00    Types: Cigarettes    Last attempt to quit: 1973    Years since quitting: 46.3  . Smokeless tobacco: Never Used  Substance and  Sexual Activity  . Alcohol use: Yes    Alcohol/week: 4.2 oz    Types: 7 Glasses of wine per week  . Drug use: No  . Sexual activity: Yes  Lifestyle  . Physical activity:    Days per week: Not on file    Minutes per session: Not on file  . Stress: Not on file  Relationships  . Social connections:    Talks on phone: Not on file    Gets together: Not on file    Attends religious service: Not on file    Active member of club or organization: Not on file    Attends meetings of clubs or organizations: Not on file    Relationship status: Not on file  Other Topics Concern  . Not on file  Social History Narrative   From [place]   Drinks 1 caffeine drink a day     Outpatient Encounter Medications as of 01/23/2018  Medication Sig  . allopurinol (ZYLOPRIM) 300 MG tablet TAKE 1 TABLET(300 MG) BY MOUTH DAILY AT NIGHT  . Ascorbic Acid (VITAMIN C) 1000 MG tablet Take 1,000 mg by mouth daily.    Marland Kitchen aspirin EC 81 MG tablet Take 162 mg by mouth 2 (two) times daily.  Marland Kitchen atorvastatin (LIPITOR) 20 MG tablet TAKE 1 TABLET(20 MG) BY MOUTH DAILY  . bisoprolol (ZEBETA) 5 MG tablet TAKE 1 AND 1/2 TABLETS(7.5 MG) BY MOUTH DAILY  . calcium carbonate (TUMS EX) 750 MG chewable tablet Chew 1 tablet by mouth every 2 (two) hours as needed for heartburn.  . Calcium-Magnesium-Zinc 1000-400-15 MG TABS Take 1 tablet by mouth daily.    . hydrochlorothiazide (HYDRODIURIL) 12.5 MG tablet Take 1 tablet (12.5 mg total) by mouth 2 (two) times daily.  Marland Kitchen lisinopril (PRINIVIL,ZESTRIL) 20 MG tablet TAKE 1 TABLET(20 MG) BY MOUTH DAILY  . Multiple Vitamin (MULTIVITAMIN) tablet Take 1 tablet by mouth daily.  . Omega-3 Fatty Acids (FISH OIL PO) Take 1 capsule by mouth daily.   . potassium chloride (K-DUR) 10 MEQ tablet Take 10 mEq by mouth daily.  Marland Kitchen VITAMIN E PO Take 1 capsule by mouth daily.    No facility-administered encounter medications on file as of 01/23/2018.     Activities of Daily Living In your present state of  health, do you have any difficulty performing the following activities: 01/23/2018  Hearing? N  Vision? N  Difficulty concentrating or making decisions? N  Walking or climbing stairs? N  Dressing or bathing? N  Doing errands, shopping? N  Preparing Food and eating ? N  Using the Toilet? N  In the past six months, have you accidently leaked urine? N  Do you have problems with loss of bowel control? N  Managing your Medications? N  Managing your Finances? N  Housekeeping or managing your Housekeeping? N  Some recent data might be hidden    Patient Care Team: Tower, Wynelle Fanny, MD as PCP - General   Assessment:   This is a routine wellness examination for [place].   Hearing Screening   125Hz  250Hz  500Hz  1000Hz  2000Hz  3000Hz  4000Hz  6000Hz  8000Hz   Right ear:   40 40 40  0    Left ear:   40 40 40  0    Vision Screening Comments: Last vision exam in Jan 2019 with Dr. Gloriann Loan  Exercise Activities and Dietary recommendations Current Exercise Habits: Home exercise routine, Type of exercise: walking;Other - see comments(running, tennis), Time (Minutes): 45, Frequency (Times/Week): 4, Weekly Exercise (Minutes/Week): 180, Intensity: Moderate, Exercise limited by: None identified  Goals    . Have 3 meals a day     Starting 01/23/2018, I will continue to make a healthy protein choice for breakfast in order to avoid skipping meals.        Fall Risk Fall Risk  01/23/2018 01/18/2017 11/10/2016 01/21/2016 01/14/2016  Falls in the past year? No No No No No   Depression Screen PHQ 2/9 Scores 01/23/2018 01/18/2017 01/21/2016 01/14/2016  PHQ - 2 Score 0 0 0 0  PHQ- 9 Score 0 - - -    Cognitive Function MMSE - Mini Mental State Exam 01/23/2018 01/18/2017 01/21/2016  Orientation to time 5 5 5   Orientation to Place 5 5 5   Registration 3 3 3   Attention/ Calculation 0 0 0  Recall 2 3 3   Recall-comments unable to recall 1 of 3 words - -  Language- name 2 objects 0 0 0  Language- repeat 1 1 1   Language- follow  3 step command 3 3 3   Language- read & follow direction 0 0 0  Write a sentence 0 0 0  Copy design 0 0 0  Total score 19 20 20      PLEASE NOTE: A Mini-Cog screen was completed. Maximum score is 20. A value of 0 denotes this part of [place] MMSE was not completed or the patient failed this part of the Mini-Cog screening.   Mini-Cog Screening Orientation to Time - Max 5 pts Orientation to Place - Max 5 pts Registration - Max 3 pts Recall - Max 3 pts Language Repeat - Max 1 pts Language Follow 3 Step Command - Max 3 pts     Immunization History  Administered Date(s) Administered  . Hepatitis A, Adult 07/21/2016  . Influenza Split 08/25/2011  . Influenza,inj,Quad PF,6+ Mos 08/01/2013, 11/05/2014, 07/21/2016  . Pneumococcal Conjugate-13 11/05/2014  . Pneumococcal Polysaccharide-23 08/25/2011  . Td 04/01/2003, 08/01/2013    Screening Tests Health Maintenance  Topic Date Due  . INFLUENZA VACCINE  05/11/2018  . TETANUS/TDAP  08/02/2023  . COLONOSCOPY  01/17/2028  . PNA vac Low Risk Adult  Completed      Plan:     I have personally reviewed, addressed, and noted the following in the patient's chart:  A. Medical and social history B. Use of alcohol, tobacco or illicit drugs  C. Current medications and supplements D. Functional ability and status E.  Nutritional status F.  Physical activity G. Advance directives H. List of other physicians I.  Hospitalizations, surgeries, and ER visits in previous 12 months J.  [place] to include hearing, vision, cognitive, depression L. Referrals and appointments - none  In addition, I have reviewed and discussed with patient certain preventive protocols, quality metrics, and  best practice recommendations. A written personalized care plan for preventive services as well as general preventive health recommendations were provided to patient.  See attached scanned questionnaire for additional information.   Signed,   Lindell Noe, MHA, BS, LPN Health Coach

## 2018-01-23 NOTE — Assessment & Plan Note (Signed)
Platelet count is 124 No bruising/bleeding  Takes asa Continue to follow

## 2018-01-26 LAB — CUP PACEART REMOTE DEVICE CHECK
Date Time Interrogation Session: 20190312020957
MDC IDC PG IMPLANT DT: 20180313

## 2018-02-14 ENCOUNTER — Ambulatory Visit (INDEPENDENT_AMBULATORY_CARE_PROVIDER_SITE_OTHER)
Admission: RE | Admit: 2018-02-14 | Discharge: 2018-02-14 | Disposition: A | Discharge status: Home / Self Care | Payer: PPO | Source: Ambulatory Visit | Attending: Family Medicine | Admitting: Family Medicine

## 2018-02-14 ENCOUNTER — Ambulatory Visit (INDEPENDENT_AMBULATORY_CARE_PROVIDER_SITE_OTHER): Payer: PPO | Admitting: Family Medicine

## 2018-02-14 ENCOUNTER — Encounter: Payer: Self-pay | Admitting: Family Medicine

## 2018-02-14 VITALS — BP 144/76 | HR 56 | Temp 97.8°F | Ht 72.0 in | Wt 182.0 lb

## 2018-02-14 DIAGNOSIS — R1032 Left lower quadrant pain: Secondary | ICD-10-CM | POA: Insufficient documentation

## 2018-02-14 DIAGNOSIS — M1612 Unilateral primary osteoarthritis, left hip: Secondary | ICD-10-CM | POA: Diagnosis not present

## 2018-02-14 NOTE — Progress Notes (Signed)
Subjective:    Patient ID: Jeffrey [name], male    DOB: 08-11-1941, 77 y.o.   MRN: [id number]  HPI  here for c/o of groin pain  L side  Thought he pulled a muscle  1 month   Worse when he starts activity-gets a little better after warming up  Worse after sitting (inactivity) then getting up  Hard to say if sharp or dull  Stops him from playing tennis/running  No pain on outside of hip    Wt Readings from Last 3 Encounters:  02/14/18 182 lb (82.6 kg)  01/23/18 182 lb (82.6 kg)  01/23/18 182 lb (82.6 kg)   24.68 kg/m    Review of Systems  Constitutional: Negative for activity change, appetite change, fatigue, fever and unexpected weight change.  HENT: Negative for congestion, rhinorrhea, sore throat and trouble swallowing.   Eyes: Negative for pain, redness, itching and visual disturbance.  Respiratory: Negative for cough, chest tightness, shortness of breath and wheezing.   Cardiovascular: Negative for chest pain and palpitations.  Gastrointestinal: Negative for abdominal pain, blood in stool, constipation, diarrhea and nausea.  Endocrine: Negative for cold intolerance, heat intolerance, polydipsia and polyuria.  Genitourinary: Negative for difficulty urinating, dysuria, frequency and urgency.  Musculoskeletal: Negative for arthralgias, joint swelling and myalgias.       L groin pain w/o lump   Skin: Negative for pallor and rash.  Neurological: Negative for dizziness, tremors, weakness, numbness and headaches.  Hematological: Negative for adenopathy. Does not bruise/bleed easily.  Psychiatric/Behavioral: Negative for decreased concentration and dysphoric mood. The patient is not nervous/anxious.        Objective:   Physical Exam  Constitutional: He appears well-developed and well-nourished. No distress.  HENT:  Head: Normocephalic and atraumatic.  Eyes: Pupils are equal, round, and reactive to light. EOM are normal.  Neck: Normal range of motion. Neck supple.    Cardiovascular: Normal rate, regular rhythm and normal heart sounds.  Pulmonary/Chest: Effort normal and breath sounds normal. He has no wheezes.  Abdominal: Soft. Bowel sounds are normal. He exhibits no mass. There is no tenderness.  No suprapubic tenderness or fullness    Genitourinary:  Genitourinary Comments: No inguinal bulge noted   Musculoskeletal: He exhibits tenderness. He exhibits no edema or deformity.       Left hip: He exhibits decreased range of motion and tenderness. He exhibits no swelling, no crepitus and no deformity.  L hip  Pain on flex over 30 deg  Pain on internal rotation of 15 deg Nl ext rotation  Nl gait  No hamstring/quad or troch tenderness Some mild tenderness in groin area   Lymphadenopathy:    He has no cervical adenopathy.       Right: No inguinal adenopathy present.       Left: No inguinal adenopathy present.  Neurological: He displays no atrophy. No sensory deficit. He exhibits normal muscle tone.  Skin: Skin is warm and dry. No rash noted. He is not diaphoretic. No erythema. No pallor.  Psychiatric: He has a normal mood and affect.          Assessment & Plan:   Problem List Items Addressed This Visit      Other   Left groin pain - Primary    Worse on exam with int rot of hip and also flex beyond 30 deg  suspect poss hip cause  Hip film today-plan to follow  Cool/warm compress  Stretch if able  Tylenol prn if needed  Avoid high impact exercise for now       Relevant Orders   DG HIP UNILAT WITH PELVIS 2-3 VIEWS LEFT

## 2018-02-14 NOTE — Patient Instructions (Signed)
Acetaminophen (tylenol) is ok occasionally if needed for pain  Avoid high impact activities that cause pain  Walk if you can for exercise   A warm or cool compress is fine   Xray today  I wonder if pain may come from the hip  We will contact you with results and a plan

## 2018-02-14 NOTE — Assessment & Plan Note (Signed)
Worse on exam with int rot of hip and also flex beyond 30 deg  suspect poss hip cause  Hip film today-plan to follow  Cool/warm compress  Stretch if able  Tylenol prn if needed  Avoid high impact exercise for now

## 2018-02-16 ENCOUNTER — Telehealth: Payer: Self-pay | Admitting: Family Medicine

## 2018-02-16 DIAGNOSIS — R1032 Left lower quadrant pain: Secondary | ICD-10-CM

## 2018-02-16 NOTE — Telephone Encounter (Signed)
Ref done  Will route to [facility] 

## 2018-02-16 NOTE — Telephone Encounter (Signed)
-----   Message from Lurlean Nanny, [place] sent at 02/15/2018 10:50 AM EDT ----- Pt is agreeable to see ortho and would prefer [facility]

## 2018-02-17 NOTE — Telephone Encounter (Signed)
Appt made and patient aware.[place]

## 2018-02-22 DIAGNOSIS — M1612 Unilateral primary osteoarthritis, left hip: Secondary | ICD-10-CM | POA: Diagnosis not present

## 2018-02-22 DIAGNOSIS — M25552 Pain in left hip: Secondary | ICD-10-CM | POA: Diagnosis not present

## 2018-02-23 ENCOUNTER — Ambulatory Visit (INDEPENDENT_AMBULATORY_CARE_PROVIDER_SITE_OTHER): Payer: PPO | Admitting: *Deleted

## 2018-02-23 DIAGNOSIS — I639 Cerebral infarction, unspecified: Secondary | ICD-10-CM | POA: Diagnosis not present

## 2018-02-24 NOTE — Progress Notes (Signed)
Carelink Summary Report / Loop Recorder 

## 2018-02-27 LAB — CUP PACEART REMOTE DEVICE CHECK
Implantable Pulse Generator Implant Date: 20180313
MDC IDC SESS DTM: 20190414023529

## 2018-03-17 LAB — CUP PACEART REMOTE DEVICE CHECK
Date Time Interrogation Session: 20190517041044
Implantable Pulse Generator Implant Date: 20180313

## 2018-03-27 DIAGNOSIS — M25462 Effusion, left knee: Secondary | ICD-10-CM | POA: Diagnosis not present

## 2018-03-27 DIAGNOSIS — M25562 Pain in left knee: Secondary | ICD-10-CM | POA: Diagnosis not present

## 2018-03-29 ENCOUNTER — Ambulatory Visit (INDEPENDENT_AMBULATORY_CARE_PROVIDER_SITE_OTHER): Payer: PPO | Admitting: *Deleted

## 2018-03-29 DIAGNOSIS — I639 Cerebral infarction, unspecified: Secondary | ICD-10-CM | POA: Diagnosis not present

## 2018-03-29 NOTE — Progress Notes (Signed)
Carelink Summary Report / Loop Recorder 

## 2018-04-17 ENCOUNTER — Other Ambulatory Visit: Payer: Self-pay | Admitting: Sports Medicine

## 2018-04-17 DIAGNOSIS — G8929 Other chronic pain: Secondary | ICD-10-CM

## 2018-04-17 DIAGNOSIS — M25462 Effusion, left knee: Secondary | ICD-10-CM

## 2018-04-17 DIAGNOSIS — M25562 Pain in left knee: Principal | ICD-10-CM

## 2018-04-19 DIAGNOSIS — H40153 Residual stage of open-angle glaucoma, bilateral: Secondary | ICD-10-CM | POA: Diagnosis not present

## 2018-05-01 ENCOUNTER — Ambulatory Visit (INDEPENDENT_AMBULATORY_CARE_PROVIDER_SITE_OTHER): Payer: PPO | Admitting: *Deleted

## 2018-05-01 DIAGNOSIS — I639 Cerebral infarction, unspecified: Secondary | ICD-10-CM | POA: Diagnosis not present

## 2018-05-01 NOTE — Progress Notes (Signed)
Carelink Summary Report / Loop Recorder 

## 2018-05-03 LAB — CUP PACEART REMOTE DEVICE CHECK
MDC IDC PG IMPLANT DT: 20180313
MDC IDC SESS DTM: 20190619101109

## 2018-05-05 ENCOUNTER — Ambulatory Visit
Admission: RE | Admit: 2018-05-05 | Discharge: 2018-05-05 | Disposition: A | Discharge status: Home / Self Care | Payer: PPO | Source: Ambulatory Visit | Attending: Sports Medicine | Admitting: Sports Medicine

## 2018-05-05 DIAGNOSIS — M25562 Pain in left knee: Secondary | ICD-10-CM | POA: Insufficient documentation

## 2018-05-05 DIAGNOSIS — M25462 Effusion, left knee: Secondary | ICD-10-CM | POA: Diagnosis not present

## 2018-05-05 DIAGNOSIS — G8929 Other chronic pain: Secondary | ICD-10-CM

## 2018-05-09 DIAGNOSIS — M25462 Effusion, left knee: Secondary | ICD-10-CM | POA: Diagnosis not present

## 2018-05-09 DIAGNOSIS — M1712 Unilateral primary osteoarthritis, left knee: Secondary | ICD-10-CM | POA: Diagnosis not present

## 2018-05-09 DIAGNOSIS — M25562 Pain in left knee: Secondary | ICD-10-CM | POA: Diagnosis not present

## 2018-05-09 DIAGNOSIS — G8929 Other chronic pain: Secondary | ICD-10-CM | POA: Diagnosis not present

## 2018-05-26 ENCOUNTER — Other Ambulatory Visit
Admission: RE | Admit: 2018-05-26 | Discharge: 2018-05-26 | Disposition: A | Discharge status: Home / Self Care | Payer: PPO | Source: Other Acute Inpatient Hospital | Attending: Sports Medicine | Admitting: Sports Medicine

## 2018-05-26 DIAGNOSIS — M25462 Effusion, left knee: Secondary | ICD-10-CM | POA: Insufficient documentation

## 2018-05-26 DIAGNOSIS — M25562 Pain in left knee: Secondary | ICD-10-CM | POA: Diagnosis not present

## 2018-05-26 DIAGNOSIS — G8929 Other chronic pain: Secondary | ICD-10-CM | POA: Diagnosis not present

## 2018-05-26 LAB — SYNOVIAL CELL COUNT + DIFF, W/ CRYSTALS
Crystals, Fluid: NONE SEEN
EOSINOPHILS-SYNOVIAL: 1 %
Lymphocytes-Synovial Fld: 60 %
Monocyte-Macrophage-Synovial Fluid: 19 %
NEUTROPHIL, SYNOVIAL: 20 %
WBC, SYNOVIAL: 2948 /mm3 — AB (ref 0–200)

## 2018-05-31 DIAGNOSIS — M25562 Pain in left knee: Secondary | ICD-10-CM | POA: Diagnosis not present

## 2018-05-31 DIAGNOSIS — M25462 Effusion, left knee: Secondary | ICD-10-CM | POA: Diagnosis not present

## 2018-05-31 DIAGNOSIS — G8929 Other chronic pain: Secondary | ICD-10-CM | POA: Diagnosis not present

## 2018-06-05 ENCOUNTER — Ambulatory Visit (INDEPENDENT_AMBULATORY_CARE_PROVIDER_SITE_OTHER): Payer: PPO | Admitting: *Deleted

## 2018-06-05 DIAGNOSIS — I639 Cerebral infarction, unspecified: Secondary | ICD-10-CM

## 2018-06-05 NOTE — Progress Notes (Signed)
Carelink Summary Report / Loop Recorder 

## 2018-06-13 DIAGNOSIS — G8929 Other chronic pain: Secondary | ICD-10-CM | POA: Diagnosis not present

## 2018-06-13 DIAGNOSIS — M1712 Unilateral primary osteoarthritis, left knee: Secondary | ICD-10-CM | POA: Diagnosis not present

## 2018-06-13 DIAGNOSIS — M25562 Pain in left knee: Secondary | ICD-10-CM | POA: Diagnosis not present

## 2018-06-13 DIAGNOSIS — M25462 Effusion, left knee: Secondary | ICD-10-CM | POA: Diagnosis not present

## 2018-06-15 NOTE — Therapy (Signed)
[facility] [address] [place], [place], [address] Phone: [phone]   Fax:  [phone]  Patient Details  Name: Jeffrey [name] MRN: [id number] Date of Birth: 11-25-1940 Referring Provider:  Loura Pardon, MD   Encounter Date: 06/15/2018  SPEECH THERAPY DISCHARGE SUMMARY  Visits from Start of Care: 8  Current functional level related to goals / functional outcomes: Pt was seen for 8 ST sessions focusing on dysarthria/speech intelligibility. He did not return after the 8th session. Goals were as follows:  SLP Short Term Goals - 12/01/16 1358              SLP SHORT TERM GOAL #1    Title pt will complete HEP for dysarthria with rare min A over three sessions    Status Achieved         SLP SHORT TERM GOAL #2    Title pt will use compensatory strategies for dysarthria 80% of the time in 5 minutes simple converastion    Status Achieved         SLP SHORT TERM GOAL #3    Title pt will demo compenastory strategies for dysarthria in 95% of sentence responses to therapy stimuli    Status Achieved                       SLP Long Term Goals - 12/08/16 1456              SLP LONG TERM GOAL #1    Title pt will report less frequent requests from people for repeats compared to prior to [place] #2    Title pt will demo HEP with modified independence over two sessions    Baseline 12-08-16    Time 3    Period Weeks    Status On-going         SLP LONG TERM GOAL #3    Title pt will use compenastory strategies for dysarthria to improve intelligiblity to 95% over 10 minutes mod complex conversation outdoors, over three sessions    Baseline 12-01-16, 12-08-16    Time 3    Period Weeks    Status Revised                   Plan - 12/08/16 1456     Clinical Impression Statement Pt with intermittent mild dysarthria today, and with pt's use of speech intelligibility strategies in  conversation in exchanges in and outside of therapy room pt maintained 100% intelligibilty. Outdoors was 98% intelligibility. Strongly suspect next session d/c. Skilled ST is necessary to ensure consistency in habitualizing compensatory strategies for dysarthria across speaking situations.       Remaining deficits: Assumed mild dysarthria remains, but is difficult to ascertain due to not seen since Feb 2018.   Education / Equipment: HEP for dysarthria, compensations for dysarthria.  Plan: Patient agrees to discharge.  Patient goals were partially met. Patient is being discharged due to not returning since the last visit.  ?????      [place] ,[place], CCC-SLP  06/15/2018, 8:23 AM  [facility] [address] [place], [place], [address] Phone: [phone]   Fax:  [phone]

## 2018-06-16 LAB — CUP PACEART REMOTE DEVICE CHECK
Implantable Pulse Generator Implant Date: 20180313
MDC IDC SESS DTM: 20190722100602

## 2018-07-04 LAB — CUP PACEART REMOTE DEVICE CHECK
Date Time Interrogation Session: 20190824100911
MDC IDC PG IMPLANT DT: 20180313

## 2018-07-06 ENCOUNTER — Ambulatory Visit (INDEPENDENT_AMBULATORY_CARE_PROVIDER_SITE_OTHER): Payer: PPO | Admitting: *Deleted

## 2018-07-06 DIAGNOSIS — I639 Cerebral infarction, unspecified: Secondary | ICD-10-CM | POA: Diagnosis not present

## 2018-07-06 NOTE — Progress Notes (Signed)
Carelink Summary Report / Loop Recorder 

## 2018-07-10 LAB — CUP PACEART REMOTE DEVICE CHECK
Date Time Interrogation Session: 20190926134011
Implantable Pulse Generator Implant Date: 20180313

## 2018-08-08 ENCOUNTER — Ambulatory Visit (INDEPENDENT_AMBULATORY_CARE_PROVIDER_SITE_OTHER): Payer: PPO | Admitting: *Deleted

## 2018-08-08 DIAGNOSIS — I639 Cerebral infarction, unspecified: Secondary | ICD-10-CM | POA: Diagnosis not present

## 2018-08-08 NOTE — Progress Notes (Signed)
Carelink Summary Report / Loop Recorder 

## 2018-08-30 LAB — CUP PACEART REMOTE DEVICE CHECK
Implantable Pulse Generator Implant Date: 20180313
MDC IDC SESS DTM: 20191029143827

## 2018-09-05 DIAGNOSIS — M25462 Effusion, left knee: Secondary | ICD-10-CM | POA: Diagnosis not present

## 2018-09-05 DIAGNOSIS — M1712 Unilateral primary osteoarthritis, left knee: Secondary | ICD-10-CM | POA: Diagnosis not present

## 2018-09-05 DIAGNOSIS — G8929 Other chronic pain: Secondary | ICD-10-CM | POA: Diagnosis not present

## 2018-09-05 DIAGNOSIS — M25562 Pain in left knee: Secondary | ICD-10-CM | POA: Diagnosis not present

## 2018-09-11 ENCOUNTER — Ambulatory Visit (INDEPENDENT_AMBULATORY_CARE_PROVIDER_SITE_OTHER): Payer: PPO

## 2018-09-11 DIAGNOSIS — I639 Cerebral infarction, unspecified: Secondary | ICD-10-CM | POA: Diagnosis not present

## 2018-09-11 NOTE — Progress Notes (Signed)
Carelink Summary Report / Loop Recorder 

## 2018-10-08 LAB — CUP PACEART REMOTE DEVICE CHECK
Implantable Pulse Generator Implant Date: 20180313
MDC IDC SESS DTM: 20191201153800

## 2018-10-13 ENCOUNTER — Ambulatory Visit (INDEPENDENT_AMBULATORY_CARE_PROVIDER_SITE_OTHER): Payer: PPO

## 2018-10-13 DIAGNOSIS — I639 Cerebral infarction, unspecified: Secondary | ICD-10-CM | POA: Diagnosis not present

## 2018-10-13 LAB — CUP PACEART REMOTE DEVICE CHECK
Implantable Pulse Generator Implant Date: 20180313
MDC IDC SESS DTM: 20200103130430

## 2018-10-16 NOTE — Progress Notes (Signed)
Carelink Summary Report / Loop Recorder 

## 2018-11-15 ENCOUNTER — Ambulatory Visit (INDEPENDENT_AMBULATORY_CARE_PROVIDER_SITE_OTHER): Payer: PPO

## 2018-11-15 DIAGNOSIS — I639 Cerebral infarction, unspecified: Secondary | ICD-10-CM | POA: Diagnosis not present

## 2018-11-15 DIAGNOSIS — I10 Essential (primary) hypertension: Secondary | ICD-10-CM

## 2018-11-17 LAB — CUP PACEART REMOTE DEVICE CHECK
Date Time Interrogation Session: 20200205164008
Implantable Pulse Generator Implant Date: 20180313

## 2018-11-24 NOTE — Progress Notes (Signed)
Carelink Summary Report / Loop Recorder 

## 2018-12-02 ENCOUNTER — Other Ambulatory Visit: Payer: Self-pay | Admitting: Internal Medicine

## 2018-12-04 ENCOUNTER — Telehealth: Payer: Self-pay | Admitting: Internal Medicine

## 2018-12-04 NOTE — Telephone Encounter (Signed)
Patient needs an appointment for refills. Patient was last seen 05/2017

## 2018-12-04 NOTE — Telephone Encounter (Signed)
LMOV to schedule follow up 

## 2018-12-04 NOTE — Telephone Encounter (Signed)
-----   Message from Janan Ridge, [place] sent at 12/04/2018 10:05 AM EST ----- Regarding: Appointment for refills. Patient needs an appointment for further refills. If patient does not want to schedule an appointment please make them aware to contact PCP for refills. I have sent in enough medication until appointment can be made.   Patient was last seen 05/2017 and needed to follow up in 1 year.  HYDROCHLOROTHIAZIDE 12.5MG  TABLETS  Thanks Ladies!

## 2018-12-11 ENCOUNTER — Other Ambulatory Visit: Payer: Self-pay | Admitting: Internal Medicine

## 2018-12-11 ENCOUNTER — Other Ambulatory Visit: Payer: Self-pay

## 2018-12-11 MED ORDER — HYDROCHLOROTHIAZIDE 12.5 MG PO TABS: 12.5000 mg | ORAL_TABLET | Freq: Two times a day (BID) | ORAL | 0 refills | Status: DC

## 2018-12-11 NOTE — Telephone Encounter (Signed)
*  STAT* If patient is at the pharmacy, call can be transferred to refill team.   1. Which medications need to be refilled? (please list name of each medication and dose if known) Hydrochlorothiazide  2. Which pharmacy/location (including street and city if local pharmacy) is medication to be sent to? [facility]  3. Do they need a 30 day or 90 day supply? [place]

## 2018-12-11 NOTE — Telephone Encounter (Signed)
lmov to schedule fu appt

## 2018-12-11 NOTE — Telephone Encounter (Signed)
Please advise if ok to refill 90 day pt hasn't been seen since 02/2017 has upcoming appointment 01/2019.

## 2018-12-11 NOTE — Telephone Encounter (Signed)
Scheduled

## 2018-12-11 NOTE — Telephone Encounter (Signed)
Pt has not been seen since 02/2017. Pt is overdue for 1 yr f/u. Please contact pt for future appointment.

## 2018-12-18 ENCOUNTER — Ambulatory Visit (INDEPENDENT_AMBULATORY_CARE_PROVIDER_SITE_OTHER): Payer: PPO | Admitting: *Deleted

## 2018-12-18 DIAGNOSIS — I639 Cerebral infarction, unspecified: Secondary | ICD-10-CM

## 2018-12-19 LAB — CUP PACEART REMOTE DEVICE CHECK
Implantable Pulse Generator Implant Date: 20180313
MDC IDC SESS DTM: 20200309173954

## 2018-12-20 ENCOUNTER — Emergency Department: Payer: PPO

## 2018-12-20 ENCOUNTER — Emergency Department
Admission: EM | Admit: 2018-12-20 | Discharge: 2018-12-20 | Disposition: A | Discharge status: Home / Self Care | Payer: PPO | Attending: Emergency Medicine | Admitting: Emergency Medicine

## 2018-12-20 ENCOUNTER — Other Ambulatory Visit: Payer: Self-pay

## 2018-12-20 ENCOUNTER — Encounter: Payer: Self-pay | Admitting: Emergency Medicine

## 2018-12-20 ENCOUNTER — Telehealth: Payer: Self-pay | Admitting: Internal Medicine

## 2018-12-20 DIAGNOSIS — R42 Dizziness and giddiness: Secondary | ICD-10-CM | POA: Diagnosis not present

## 2018-12-20 DIAGNOSIS — Z7982 Long term (current) use of aspirin: Secondary | ICD-10-CM | POA: Diagnosis not present

## 2018-12-20 DIAGNOSIS — Z85828 Personal history of other malignant neoplasm of skin: Secondary | ICD-10-CM | POA: Insufficient documentation

## 2018-12-20 DIAGNOSIS — M5410 Radiculopathy, site unspecified: Secondary | ICD-10-CM | POA: Diagnosis not present

## 2018-12-20 DIAGNOSIS — Z79899 Other long term (current) drug therapy: Secondary | ICD-10-CM | POA: Insufficient documentation

## 2018-12-20 DIAGNOSIS — Z87891 Personal history of nicotine dependence: Secondary | ICD-10-CM | POA: Insufficient documentation

## 2018-12-20 DIAGNOSIS — R001 Bradycardia, unspecified: Secondary | ICD-10-CM | POA: Diagnosis not present

## 2018-12-20 DIAGNOSIS — Z8673 Personal history of transient ischemic attack (TIA), and cerebral infarction without residual deficits: Secondary | ICD-10-CM | POA: Insufficient documentation

## 2018-12-20 DIAGNOSIS — I10 Essential (primary) hypertension: Secondary | ICD-10-CM | POA: Diagnosis not present

## 2018-12-20 DIAGNOSIS — R200 Anesthesia of skin: Secondary | ICD-10-CM | POA: Insufficient documentation

## 2018-12-20 DIAGNOSIS — R202 Paresthesia of skin: Secondary | ICD-10-CM | POA: Diagnosis not present

## 2018-12-20 HISTORY — DX: Transient cerebral ischemic attack, unspecified: G45.9

## 2018-12-20 LAB — CBC
HCT: 42.8 % (ref 39.0–52.0)
HEMOGLOBIN: 14.9 g/dL (ref 13.0–17.0)
MCH: 32 pg (ref 26.0–34.0)
MCHC: 34.8 g/dL (ref 30.0–36.0)
MCV: 92 fL (ref 80.0–100.0)
NRBC: 0 % (ref 0.0–0.2)
Platelets: 121 10*3/uL — ABNORMAL LOW (ref 150–400)
RBC: 4.65 MIL/uL (ref 4.22–5.81)
RDW: 12.9 % (ref 11.5–15.5)
WBC: 5.7 10*3/uL (ref 4.0–10.5)

## 2018-12-20 LAB — COMPREHENSIVE METABOLIC PANEL
ALBUMIN: 4.1 g/dL (ref 3.5–5.0)
ALT: 43 U/L (ref 0–44)
AST: 42 U/L — ABNORMAL HIGH (ref 15–41)
Alkaline Phosphatase: 45 U/L (ref 38–126)
Anion gap: 9 (ref 5–15)
BUN: 15 mg/dL (ref 8–23)
CALCIUM: 8.8 mg/dL — AB (ref 8.9–10.3)
CO2: 26 mmol/L (ref 22–32)
Chloride: 104 mmol/L (ref 98–111)
Creatinine, Ser: 0.56 mg/dL — ABNORMAL LOW (ref 0.61–1.24)
GFR calc Af Amer: >60 mL/min (ref >60)
GFR calc non Af Amer: >60 mL/min (ref >60)
Glucose, Bld: 107 mg/dL — ABNORMAL HIGH (ref 70–99)
Potassium: 3.8 mmol/L (ref 3.5–5.1)
Sodium: 139 mmol/L (ref 135–145)
Total Bilirubin: 1.1 mg/dL (ref 0.3–1.2)
Total Protein: 6.9 g/dL (ref 6.5–8.1)

## 2018-12-20 LAB — DIFFERENTIAL
Abs Immature Granulocytes: 0.02 10*3/uL (ref 0.00–0.07)
Basophils Absolute: 0 10*3/uL (ref 0.0–0.1)
Basophils Relative: 1 %
Eosinophils Absolute: 0.4 10*3/uL (ref 0.0–0.5)
Eosinophils Relative: 6 %
Immature Granulocytes: 0 %
Lymphocytes Relative: 27 %
Lymphs Abs: 1.5 10*3/uL (ref 0.7–4.0)
Monocytes Absolute: 0.5 10*3/uL (ref 0.1–1.0)
Monocytes Relative: 9 %
NEUTROS ABS: 3.3 10*3/uL (ref 1.7–7.7)
Neutrophils Relative %: 57 %

## 2018-12-20 LAB — PROTIME-INR
INR: 1.1 (ref 0.8–1.2)
Prothrombin Time: 13.6 seconds (ref 11.4–15.2)

## 2018-12-20 LAB — APTT: aPTT: 25 seconds (ref 24–36)

## 2018-12-20 NOTE — ED Notes (Signed)
Pt denies chest pain/SHOB/Dizziness at this time. Pt is A/Ox4. NAD noted. This RN will continue to monitor pt,

## 2018-12-20 NOTE — ED Notes (Signed)
Patient off unit to MRI.

## 2018-12-20 NOTE — ED Provider Notes (Signed)
[facility] Emergency Department Provider Note   ____________________________________________    I have reviewed the triage vital signs and the nursing notes.   HISTORY  Chief Complaint Numbness     HPI Jeffrey [name] is a 78 y.o. male who presents with complaints of numbness to the dorsal right hand.  He reports this is been ongoing for about 4 days.  He denies muscle weakness.  He also describes intermittent tingling/numbness of the left hand also x4 days.  He reports a history of a CVA in the past, he does take aspirin daily.  Prior CVA left him with a mild left facial droop, this was over 2 years ago.  No nausea vomiting.  No headache  Past Medical History:  Diagnosis Date  . DDD (degenerative disc disease)    with epidural injection  . Gastritis    H pylori, partial tx.  EGD 04/2002  . Gout   . History of skin cancer   . Hyperlipidemia   . Hypertension    some white coat component- better at home  . Stroke ([place])   . TIA (transient ischemic attack)     Patient Active Problem List   Diagnosis Date Noted  . Left groin pain 02/14/2018  . 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 Blurred vision 11/05/2014  . Prostate cancer screening 11/05/2014  . Family history of colon cancer 08/01/2013  . Colon cancer screening 08/01/2013  . Routine general medical examination at a health care facility 08/01/2013  . PSA, INCREASED 09/07/2010  . 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 Thrombocytopenia ([place]) 01/16/2009  . Hyperlipidemia 06/08/2007  . Gout 06/08/2007  . Essential hypertension 06/08/2007  . Hyperglycemia 06/08/2007  . SKIN CANCER, HX OF 06/08/2007    Past Surgical History:  Procedure Laterality Date  . COLONOSCOPY WITH PROPOFOL N/A 01/16/2018   Procedure: COLONOSCOPY WITH PROPOFOL;  Surgeon: Lollie Sails, MD;  Location[facility] ENDOSCOPY;  Service: Endoscopy;   Laterality: N/A;  . [place]  . LOOP RECORDER INSERTION N/A 12/21/2016   Procedure: Loop Recorder Insertion;  Surgeon: Deboraha Sprang, MD;  Location: [facility] CV LAB;  Service: Cardiovascular;  Laterality: N/A;  . TEE WITHOUT CARDIOVERSION N/A 12/21/2016   Procedure: TRANSESOPHAGEAL ECHOCARDIOGRAM (TEE);  Surgeon: Nelva Bush, MD;  Location: [facility] ORS;  Service: Cardiovascular;  Laterality: N/A;    Prior to Admission medications   Medication Sig Start Date End Date Taking? Authorizing Provider  allopurinol (ZYLOPRIM) 300 MG tablet TAKE 1 TABLET(300 MG) BY MOUTH DAILY AT NIGHT 01/23/18   Tower, Wynelle Fanny, MD  Ascorbic Acid (VITAMIN C) 1000 MG tablet Take 1,000 mg by mouth daily.      [provider]  aspirin EC 81 MG tablet Take 162 mg by mouth 2 (two) times daily.    [provider]  atorvastatin (LIPITOR) 20 MG tablet TAKE 1 TABLET(20 MG) BY MOUTH DAILY 01/23/18   Tower, Wynelle Fanny, MD  bisoprolol (ZEBETA) 5 MG tablet TAKE 1 AND 1/2 TABLETS(7.5 MG) BY MOUTH DAILY 01/23/18   Tower, Wynelle Fanny, MD  calcium carbonate (TUMS EX) 750 MG chewable tablet Chew 1 tablet by mouth every 2 (two) hours as needed for heartburn.    [provider]  Calcium-Magnesium-Zinc 1000-400-15 MG TABS Take 1 tablet by mouth daily.      [provider]  hydrochlorothiazide (HYDRODIURIL) 12.5 MG tablet TAKE 1 TABLET(12.5 MG) BY  MOUTH TWICE DAILY 12/12/18   End, Harrell Gave, MD  lisinopril (PRINIVIL,ZESTRIL) 20 MG tablet Take 1 tablet (20 mg total) by mouth daily. 01/23/18   Tower, Wynelle Fanny, MD  Multiple Vitamin (MULTIVITAMIN) tablet Take 1 tablet by mouth daily.    [provider]  Omega-3 Fatty Acids (FISH OIL PO) Take 1 capsule by mouth daily.     [provider]  potassium chloride (K-DUR) 10 MEQ tablet Take 10 mEq by mouth daily.    [provider]  VITAMIN E PO Take 1 capsule by mouth daily.     [provider]     Allergies Patient  has no known allergies.  Family History  Problem Relation Age of Onset  . Other Mother        brain tumor  . Skin cancer Mother        suspect melanoma  . Heart attack Father 40  . Colon cancer Sister     Social History Social History   Tobacco Use  . Smoking status: Former Smoker    Packs/day: 1.00    Years: 8.00    Pack years: 8.00    Types: Cigarettes    Last attempt to quit: 1973    Years since quitting: 47.2  . Smokeless tobacco: Never Used  Substance Use Topics  . Alcohol use: Yes    Alcohol/week: 7.0 standard drinks    Types: 7 Glasses of wine per week  . Drug use: No    Review of Systems  Constitutional: No fever/chills Eyes: No visual changes.  ENT: No neck pain Cardiovascular: Denies chest pain. Respiratory: Denies shortness of breath. Gastrointestinal: No abdominal pain.  No nausea, no vomiting.   Genitourinary: Negative for dysuria. Musculoskeletal: Negative for back pain. Skin: Negative for rash. Neurological: As above  ____________________________________________   PHYSICAL EXAM:  VITAL SIGNS: ED Triage Vitals  Enc Vitals Group     BP 12/20/18 1309 (!) 187/96     Pulse Rate 12/20/18 1309 (!) 57     Resp 12/20/18 1309 16     Temp 12/20/18 1309 97.7 F (36.5 C)     Temp Source 12/20/18 1309 Oral     SpO2 12/20/18 1309 100 %     Weight 12/20/18 1310 80.3 kg (177 lb)     Height 12/20/18 1310 1.854 m (6\' 1" )     Head Circumference --      Peak Flow --      Pain Score 12/20/18 1309 0     Pain Loc --      Pain Edu? --      Excl. in [place]? --     Constitutional: Alert and oriented.  No acute distress Eyes: Conjunctivae are normal.  PERRLA, EOMI Head: Atraumatic. Nose: No congestion/rhinnorhea. Mouth/Throat: Mucous membranes are moist.   Neck:  Painless ROM Cardiovascular: Normal rate, regular rhythm. Grossly normal heart sounds.  Good peripheral circulation. Respiratory: Normal respiratory effort.  No retractions. Lungs CTAB.  Gastrointestinal: Soft and nontender. No distention.  No CVA tenderness. Genitourinary: deferred Musculoskeletal:  Warm and well perfused Neurologic:  Normal speech and language. No gross focal neurologic deficits are appreciated.  Cranial nerves II through XII are normal Skin:  Skin is warm, dry and intact. No rash noted. Psychiatric: Mood and affect are normal. Speech and behavior are normal.  ____________________________________________   LABS (all labs ordered are listed, but only abnormal results are displayed)  Labs Reviewed  CBC - Abnormal; Notable for the following components:  Result Value   Platelets 121 (*)    All other components within normal limits  COMPREHENSIVE METABOLIC PANEL - Abnormal; Notable for the following components:   Glucose, Bld 107 (*)    Creatinine, Ser 0.56 (*)    Calcium 8.8 (*)    AST 42 (*)    All other components within normal limits  PROTIME-INR  APTT  DIFFERENTIAL   ____________________________________________  EKG  ED ECG REPORT I, Lavonia Drafts, the attending physician, personally viewed and interpreted this ECG.  Date: 12/20/2018  Rhythm: Sinus bradycardia QRS Axis: normal Intervals: normal ST/T Wave abnormalities: normal Narrative Interpretation: no evidence of acute ischemia  ____________________________________________  RADIOLOGY  CT head concerning for subacute infarct posterior right frontal lobe ____________________________________________   PROCEDURES  Procedure(s) performed: No  Procedures   Critical Care performed: no ____________________________________________   INITIAL IMPRESSION / ASSESSMENT AND PLAN / ED COURSE  Pertinent labs & imaging results that were available during my care of the patient were reviewed by me and considered in my medical decision making (see chart for details).  Patient presents with right hand and intermittent left hand symptoms as above.  History of CVA.  CT head is  concerning for possible subacute infarct right posterior frontal lobe.  Lab work unremarkable, strength normal on exam.  Will discuss with neurology  Dr. Doy Mince recommends MRI in the emergency department for further information and to see if this is a CVA.  The patient does have an implanted loop recorder but this is safe in MRI.  I have asked Dr. Kerman Passey  to follow-up on MRI results    ____________________________________________   FINAL CLINICAL IMPRESSION(S) / ED DIAGNOSES  Final diagnoses:  Numbness  Radiculopathy, unspecified spinal region        Note:  This document was prepared using Dragon voice recognition software and may include unintentional dictation errors.   Lavonia Drafts, MD 12/20/18 1454

## 2018-12-20 NOTE — ED Notes (Signed)
Patient denies pain and is resting comfortably.  

## 2018-12-20 NOTE — ED Provider Notes (Signed)
-----------------------------------------   5:01 PM on 12/20/2018 -----------------------------------------  Patient care assumed from Dr. Corky Downs.  Patient's MRI has resulting showing no acute infarct.  Overall the patient appears very well.  I believe with a negative MRI he is safe for outpatient follow-up with his PCP for further evaluation.  Patient agreeable to plan.  We will discharge home.   Harvest Dark, MD 12/20/18 1702

## 2018-12-20 NOTE — ED Notes (Signed)
ED Provider at bedside. 

## 2018-12-20 NOTE — ED Triage Notes (Signed)
Says numbness in right hand and wrist and sometimes in left hand for about 5 days.  Called his cardiologist and he was out of town.  Denies any chest pain.

## 2018-12-20 NOTE — ED Notes (Signed)
Assumed care of patient aox4, denies headache/ cp/sob. NIH score 0, swallow eval passed. MRI screening competed prior to assuming care of patient. MRI at bedside to transfer to MRI. Will re-assess upon return.

## 2018-12-20 NOTE — ED Notes (Signed)
First Nurse Note: Pt c/o tingling in his right hand x 4-5 days. Pt is in NAD. No slurred speech or facial droop noted.

## 2018-12-20 NOTE — ED Notes (Signed)
Returned from MRI. Patient awaiting discharge to hom. VSS

## 2018-12-20 NOTE — Telephone Encounter (Signed)
Patient calling States that he has been having a numbness in his arms and hands for the past 4 to 5 days Denies chest pain or SOB Please call to discuss - patient requesting appointment sooner

## 2018-12-20 NOTE — ED Notes (Signed)
Pt states he has a hx of HTN. Per pt, she has taken his BP medication this morning as prescribed.

## 2018-12-20 NOTE — Telephone Encounter (Signed)
I spoke with the patient. He states he has been having bilateral arm/ hand numbness for the last 4-5 days. He has a history of cryptogenic stroke in 12/2016 with subsequent LINQ placement. I inquired if he was still followed by neurology and he kept stating yes, "they see my reports monthly and they are fine." I advised these reports were for his LINQ from our office. I also advised I will review his most recent transmission with the device clinic and call him back.  He currently denies any other symptoms.    I spoke with Jeffrey [name], CMA in the [facility]. She pulled the patient's most recent LINQ report dated 12/18/18. She advises the report shows "no episodes" on the current report dated 11/16/18-12/18/18.  There is 1 episode of a-fib documented for the life time of the device in 01/2017. I reviewed the patient's symptoms further with Jeffrey Faith, PA. He advised that the patient be seen in the ER for further evaluation/ testing/ possible treatment.  I have notified the patient and he voices understanding and is agreeable.

## 2018-12-25 NOTE — Progress Notes (Signed)
Carelink Summary Report / Loop Recorder 

## 2019-01-22 ENCOUNTER — Other Ambulatory Visit: Payer: Self-pay

## 2019-01-22 ENCOUNTER — Ambulatory Visit (INDEPENDENT_AMBULATORY_CARE_PROVIDER_SITE_OTHER): Payer: PPO | Admitting: *Deleted

## 2019-01-22 DIAGNOSIS — I639 Cerebral infarction, unspecified: Secondary | ICD-10-CM | POA: Diagnosis not present

## 2019-01-22 LAB — CUP PACEART REMOTE DEVICE CHECK
Date Time Interrogation Session: 20200411180951
Implantable Pulse Generator Implant Date: 20180313

## 2019-01-29 ENCOUNTER — Other Ambulatory Visit: Payer: PPO

## 2019-01-30 NOTE — Progress Notes (Signed)
Carelink Summary Report / Loop Recorder 

## 2019-02-02 ENCOUNTER — Other Ambulatory Visit: Payer: Self-pay | Admitting: Family Medicine

## 2019-02-02 NOTE — Telephone Encounter (Signed)
Please do schedule a virtual visit with me for ED fu to touch base  Then can refill for 3 mo supply times one  Thanks

## 2019-02-02 NOTE — Telephone Encounter (Signed)
Medication refill request received for the following:  1.  Allopurinol Last refilled: #90, 3 refills on 01/23/18  2. Bisoprolol Last Refilled: #90, 3 refills on 01/23/18  3. Lisinopril Last Refilled #90, 3 refills on 01/23/18  Note: patient was in the hospital ER on 12/20/18 for cryptogenic Stroke.  He cancelled appointment with [place] on 4/27 and rescheduled to 04/23/19 this was for AWV visit.   Appears he sees Cardiology on 02/07/19 but has not followed up with anyone since his ER discharge.   Last OV for general health f/u : 01/23/18, he is due for AWV.  Needs follow up with PCP.   I have LM on patient VM (ok per DPR) to have him call back and discuss virtual visit capability as we need to follow up with him prior to filling his medications.   Pend and FYI to Dr. Glori Bickers re med refill.

## 2019-02-05 ENCOUNTER — Ambulatory Visit: Payer: PPO

## 2019-02-05 ENCOUNTER — Encounter: Payer: PPO | Admitting: Family Medicine

## 2019-02-06 NOTE — Telephone Encounter (Signed)
Patient scheduled f/u on 02/12/19.

## 2019-02-07 ENCOUNTER — Ambulatory Visit: Payer: PPO | Admitting: Internal Medicine

## 2019-02-12 ENCOUNTER — Encounter: Payer: Self-pay | Admitting: Family Medicine

## 2019-02-12 ENCOUNTER — Ambulatory Visit (INDEPENDENT_AMBULATORY_CARE_PROVIDER_SITE_OTHER): Payer: PPO | Admitting: Family Medicine

## 2019-02-12 VITALS — BP 122/80

## 2019-02-12 DIAGNOSIS — R202 Paresthesia of skin: Secondary | ICD-10-CM | POA: Diagnosis not present

## 2019-02-12 DIAGNOSIS — D696 Thrombocytopenia, unspecified: Secondary | ICD-10-CM | POA: Diagnosis not present

## 2019-02-12 DIAGNOSIS — I77810 Thoracic aortic ectasia: Secondary | ICD-10-CM | POA: Diagnosis not present

## 2019-02-12 DIAGNOSIS — I10 Essential (primary) hypertension: Secondary | ICD-10-CM

## 2019-02-12 MED ORDER — ALLOPURINOL 300 MG PO TABS: 300.0000 mg | ORAL_TABLET | Freq: Every day | ORAL | 1 refills | Status: DC

## 2019-02-12 MED ORDER — BISOPROLOL FUMARATE 5 MG PO TABS: ORAL_TABLET | ORAL | 1 refills | Status: DC

## 2019-02-12 MED ORDER — ATORVASTATIN CALCIUM 20 MG PO TABS: ORAL_TABLET | ORAL | 1 refills | Status: DC

## 2019-02-12 MED ORDER — LISINOPRIL 20 MG PO TABS: ORAL_TABLET | ORAL | 1 refills | Status: DC

## 2019-02-12 NOTE — Assessment & Plan Note (Signed)
Stable w/o clinical changes Count of 121 at last draw

## 2019-02-12 NOTE — Assessment & Plan Note (Signed)
While bp was high in ED , per pt excellent at home Latest 122/80  No change in medicines made  Last labs reviewed Enc good health habits  Medications refilled

## 2019-02-12 NOTE — Assessment & Plan Note (Signed)
Controlling bp and cholesterol as much as possible No clinical changes

## 2019-02-12 NOTE — Assessment & Plan Note (Signed)
Both hands R more than L  H/o old CVA- recent MRI of brain showed nothing new Symptoms are worse with computer work and gardening  No loss of strength or other symptoms and no pain  Adv he may have carpal tunnel or other nerve impingement  inst to get wrist splints otc to wear at night and prn We will refer to neurology as well for eval /poss NCV

## 2019-02-12 NOTE — Patient Instructions (Signed)
You may have carpal tunnel or another nerve impingement in arm/wrist or neck Try over the counter wrist splints at night and as needed/let us know if it helps Change repetitive activity if it bothers you We will refer you to neurology for further eval (the office will call you)

## 2019-02-12 NOTE — Progress Notes (Signed)
Virtual Visit via Video Note  I connected with [name] on 02/12/19 at 10:00 AM EDT by a video enabled telemedicine application and verified that I am speaking with the correct person using two identifiers.  Location: Patient: at home Provider: in my office   Video visit was attempted but pt could not make his camera accessible on phone so we did a phone visit    I discussed the limitations of evaluation and management by telemedicine and the availability of in person appointments. The patient expressed understanding and agreed to proceed.  History of Present Illness: Patient had ED visit on 3/11 for R hand and intermittent L hand symptoms/numbness with tingling (in setting of h/o CVA)  Pt has had a stroke in the past and has a loop recorder for it   CT head was concerning for poss subacute infarct R post frontal lobe Nl labs  MRI was done and showed no acute infarct   MR BRAIN WO CONTRAST (Accession [id number]) (Order [id number])  Imaging  Date: 12/20/2018 Department: [facility] EMERGENCY DEPARTMENT Released By/Authorizing: Lavonia Drafts, MD (auto-released)  Exam Information   Status Exam Begun  Exam Ended   Final [99] 12/20/2018 3:20 PM 12/20/2018 3:45 PM  PACS Images   Show images for MR BRAIN WO CONTRAST  Study Result   CLINICAL DATA:  RIGHT hand numbness for 4 days.  EXAM: MRI HEAD WITHOUT CONTRAST  TECHNIQUE: Multiplanar, multiecho pulse sequences of the brain and surrounding structures were obtained without intravenous contrast.  COMPARISON:  CT head 12/20/2018.  FINDINGS: Brain: No evidence for acute infarction, hemorrhage, mass lesion, hydrocephalus, or extra-axial fluid. Generalized atrophy. Moderate T2 and FLAIR hyperintensities in the periventricular and subcortical white matter, consistent with small vessel disease. Chronic RIGHT posterior frontal subcortical infarction correlates with the CT abnormality. Elsewhere  prominent perivascular spaces. Tiny chronic lacunar infarct of the RIGHT basal ganglia/caudate. Moderate-sized remote RIGHT cerebellar infarct, affects the lateral and inferior cerebellar hemisphere.  Vascular: Flow voids are maintained.  Skull and upper cervical spine: Normal marrow signal.  Sinuses/Orbits: No significant sinus fluid accumulation. Negative orbits.  Other: None.  IMPRESSION: Atrophy and small vessel disease. Multiple old infarcts. No acute intracranial findings.   Electronically Signed   By: Staci Righter M.D.   On: 12/20/2018 16:02   Last labs    Chemistry      Component Value Date/Time   NA 139 12/20/2018 1317   NA 142 05/15/2009 0819   K 3.8 12/20/2018 1317   K 3.9 05/15/2009 0819   CL 104 12/20/2018 1317   CL 103 05/15/2009 0819   CO2 26 12/20/2018 1317   CO2 28 05/15/2009 0819   BUN 15 12/20/2018 1317   BUN 12 05/15/2009 0819   CREATININE 0.56 (L) 12/20/2018 1317   CREATININE 0.8 05/15/2009 0819      Component Value Date/Time   CALCIUM 8.8 (L) 12/20/2018 1317   CALCIUM 8.7 05/15/2009 0819   ALKPHOS 45 12/20/2018 1317   ALKPHOS 40 05/15/2009 0819   AST 42 (H) 12/20/2018 1317   AST 44 (H) 05/15/2009 0819   ALT 43 12/20/2018 1317   ALT 53 (H) 05/15/2009 0819   BILITOT 1.1 12/20/2018 1317   BILITOT 0.50 05/15/2009 0819      Lab Results  Component Value Date   WBC 5.7 12/20/2018   HGB 14.9 12/20/2018   HCT 42.8 12/20/2018   MCV 92.0 12/20/2018   PLT 121 (L) 12/20/2018    Lab Results  Component  Value Date   INR 1.1 12/20/2018   INR 1.08 12/10/2016   PTT was 25 Glucose 107  Blood pressure control- bp in ED was 168/90 At home usually 120s/80s - very well controlled    Lab Results  Component Value Date   CHOL 111 01/11/2018   HDL 44.50 01/11/2018   LDLCALC 25 01/10/2017   LDLDIRECT 34.0 01/11/2018   TRIG 309.0 (H) 01/11/2018   CHOLHDL 3 01/11/2018   Taking atorvastatin 20 mg daily   Uses computer/mouse a lot  He  feels the hand symptoms more when working  More in the R than L hand (whole hand) and a little in the arm   He types and gardens  Also plays tennis (does not notice that)  This does come and go  He drops arm for 10-15 minutes and this helps  Sometimes bothers him at night    No pain at all  No problems with grip/strength Playing tennis- no problem   No neck pain  No elbow pain  No new stroke symptoms   Review of Systems  Constitutional: Negative for chills, fever, malaise/fatigue and weight loss.  HENT: Negative for congestion and sinus pain.   Respiratory: Negative for cough and wheezing.   Cardiovascular: Negative for chest pain and palpitations.  Gastrointestinal: Negative for nausea and vomiting.  Musculoskeletal: Negative for myalgias.  Skin: Negative for rash.  Neurological: Positive for tingling and sensory change. Negative for dizziness, speech change, focal weakness, seizures, loss of consciousness and headaches.      Patient Active Problem List   Diagnosis Date Noted  . Paresthesia of hand 02/12/2019  . Left groin pain 02/14/2018  . 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 Cryptogenic stroke ([place]) 11/08/2016  . Blurred vision 11/05/2014  . Prostate cancer screening 11/05/2014  . Family history of colon cancer 08/01/2013  . Colon cancer screening 08/01/2013  . Routine general medical examination at a health care facility 08/01/2013  . PSA, INCREASED 09/07/2010  . 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 Thrombocytopenia ([place]) 01/16/2009  . Hyperlipidemia 06/08/2007  . Gout 06/08/2007  . Essential hypertension 06/08/2007  . Hyperglycemia 06/08/2007  . SKIN CANCER, HX OF 06/08/2007   Past Medical History:  Diagnosis Date  . DDD (degenerative disc disease)    with epidural injection  . Gastritis    H pylori, partial tx.  EGD 04/2002  . Gout   . History of skin cancer   . Hyperlipidemia   . Hypertension    some white  coat component- better at home  . Stroke ([place])   . TIA (transient ischemic attack)    Past Surgical History:  Procedure Laterality Date  . COLONOSCOPY WITH PROPOFOL N/A 01/16/2018   Procedure: COLONOSCOPY WITH PROPOFOL;  Surgeon: Lollie Sails, MD;  Location: [facility] ENDOSCOPY;  Service: Endoscopy;  Laterality: N/A;  . [place]  . LOOP RECORDER INSERTION N/A 12/21/2016   Procedure: Loop Recorder Insertion;  Surgeon: Deboraha Sprang, MD;  Location: [place] [facility];  Service: Cardiovascular;  Laterality: N/A;  . TEE WITHOUT CARDIOVERSION N/A 12/21/2016   Procedure: TRANSESOPHAGEAL ECHOCARDIOGRAM (TEE);  Surgeon: Nelva Bush, MD;  Location: [facility] ORS;  Service: Cardiovascular;  Laterality: N/A;   Social History   Tobacco Use  . Smoking status: Former Smoker    Packs/day: 1.00    Years: 8.00    Pack years: 8.00    Types: Cigarettes    Last attempt to quit: 1973  Years since quitting: 47.3  . Smokeless tobacco: Never Used  Substance Use Topics  . Alcohol use: Yes    Alcohol/week: 7.0 standard drinks    Types: 7 Glasses of wine per week  . Drug use: No   Family History  Problem Relation Age of Onset  . Other Mother        brain tumor  . Skin cancer Mother        suspect melanoma  . Heart attack Father 6  . Colon cancer Sister    No Known Allergies Current Outpatient Medications on File Prior to Visit  Medication Sig Dispense Refill  . Ascorbic Acid (VITAMIN C) 1000 MG tablet Take 1,000 mg by mouth daily.      Marland Kitchen aspirin EC 81 MG tablet Take 162 mg by mouth 2 (two) times daily.    . calcium carbonate (TUMS EX) 750 MG chewable tablet Chew 1 tablet by mouth every 2 (two) hours as needed for heartburn.    . Calcium-Magnesium-Zinc 1000-400-15 MG TABS Take 1 tablet by mouth daily.      . hydrochlorothiazide (HYDRODIURIL) 12.5 MG tablet TAKE 1 TABLET(12.5 MG) BY MOUTH TWICE DAILY 180 tablet 3  . hydrochlorothiazide (HYDRODIURIL) 12.5 MG tablet TAKE 1  TABLET(12.5 MG) BY MOUTH TWICE DAILY 180 tablet 0  . Multiple Vitamin (MULTIVITAMIN) tablet Take 1 tablet by mouth daily.    . Omega-3 Fatty Acids (FISH OIL PO) Take 1 capsule by mouth daily.     . potassium chloride (K-DUR) 10 MEQ tablet Take 10 mEq by mouth daily.    Marland Kitchen VITAMIN E PO Take 1 capsule by mouth daily.      No current facility-administered medications on file prior to visit.     Observations/Objective: Patient sounds like his normal self Mentally sharp-answers questions appropriately Speech is not slurred or hoarse No sob or cough during interview Normal affect- pleasant   Assessment and Plan: Problem List Items Addressed This Visit      Cardiovascular and Mediastinum   Essential hypertension    While bp was high in ED , per pt excellent at home Latest 122/80  No change in medicines made  Last labs reviewed Enc good health habits  Medications refilled      Relevant Medications   bisoprolol (ZEBETA) 5 MG tablet   lisinopril (ZESTRIL) 20 MG tablet   atorvastatin (LIPITOR) 20 MG tablet   Ascending aorta dilation ([facility])    Controlling bp and cholesterol as much as possible No clinical changes       Relevant Medications   bisoprolol (ZEBETA) 5 MG tablet   lisinopril (ZESTRIL) 20 MG tablet   atorvastatin (LIPITOR) 20 MG tablet     Other   Thrombocytopenia (H[facility])    Stable w/o clinical changes Count of 121 at last draw      Paresthesia of hand - Primary    Both hands R more than L  H/o old CVA- recent MRI of brain showed nothing new Symptoms are worse with computer work and gardening  No loss of strength or other symptoms and no pain  Adv he may have carpal tunnel or other nerve impingement  inst to get wrist splints otc to wear at night and prn We will refer to neurology as well for eval /poss NCV          Follow Up Instructions: You may have carpal tunnel or another nerve impingement in arm/wrist or neck Try over the counter wrist splints at night  and  as needed/let us know if it helps Change repetitive activity if it bothers you We will refer you to neurology for further eval (the office will call you)   I discussed the assessment and treatment plan with the patient. The patient was provided an opportunity to ask questions and all were answered. The patient agreed with the plan and demonstrated an understanding of the instructions.   The patient was advised to call back or seek an in-person evaluation if the symptoms worsen or if the condition fails to improve as anticipated.    Loura Pardon, MD

## 2019-02-19 DIAGNOSIS — R200 Anesthesia of skin: Secondary | ICD-10-CM | POA: Diagnosis not present

## 2019-02-19 DIAGNOSIS — R202 Paresthesia of skin: Secondary | ICD-10-CM | POA: Diagnosis not present

## 2019-02-22 ENCOUNTER — Ambulatory Visit (INDEPENDENT_AMBULATORY_CARE_PROVIDER_SITE_OTHER): Payer: PPO | Admitting: *Deleted

## 2019-02-22 ENCOUNTER — Other Ambulatory Visit: Payer: Self-pay

## 2019-02-22 DIAGNOSIS — I639 Cerebral infarction, unspecified: Secondary | ICD-10-CM | POA: Diagnosis not present

## 2019-02-22 LAB — CUP PACEART REMOTE DEVICE CHECK
Date Time Interrogation Session: 20200514202950
Implantable Pulse Generator Implant Date: 20180313

## 2019-02-27 NOTE — Progress Notes (Signed)
Carelink Summary Report / Loop Recorder 

## 2019-03-27 ENCOUNTER — Ambulatory Visit (INDEPENDENT_AMBULATORY_CARE_PROVIDER_SITE_OTHER): Payer: PPO | Admitting: *Deleted

## 2019-03-27 DIAGNOSIS — I639 Cerebral infarction, unspecified: Secondary | ICD-10-CM

## 2019-03-28 LAB — CUP PACEART REMOTE DEVICE CHECK
Date Time Interrogation Session: 20200616213945
Implantable Pulse Generator Implant Date: 20180313

## 2019-04-06 NOTE — Progress Notes (Signed)
Carelink Summary Report / Loop Recorder 

## 2019-04-23 ENCOUNTER — Other Ambulatory Visit: Payer: Self-pay

## 2019-04-23 ENCOUNTER — Ambulatory Visit: Payer: PPO

## 2019-04-23 ENCOUNTER — Encounter: Payer: Self-pay | Admitting: Family Medicine

## 2019-04-23 ENCOUNTER — Ambulatory Visit (INDEPENDENT_AMBULATORY_CARE_PROVIDER_SITE_OTHER): Payer: PPO | Admitting: Family Medicine

## 2019-04-23 VITALS — Temp 98.0°F | Ht 71.5 in | Wt 180.7 lb

## 2019-04-23 DIAGNOSIS — D696 Thrombocytopenia, unspecified: Secondary | ICD-10-CM

## 2019-04-23 DIAGNOSIS — I639 Cerebral infarction, unspecified: Secondary | ICD-10-CM | POA: Diagnosis not present

## 2019-04-23 DIAGNOSIS — Z800 Family history of malignant neoplasm of digestive organs: Secondary | ICD-10-CM

## 2019-04-23 DIAGNOSIS — M109 Gout, unspecified: Secondary | ICD-10-CM

## 2019-04-23 DIAGNOSIS — I10 Essential (primary) hypertension: Secondary | ICD-10-CM

## 2019-04-23 DIAGNOSIS — I77810 Thoracic aortic ectasia: Secondary | ICD-10-CM | POA: Diagnosis not present

## 2019-04-23 DIAGNOSIS — Z125 Encounter for screening for malignant neoplasm of prostate: Secondary | ICD-10-CM

## 2019-04-23 DIAGNOSIS — R7303 Prediabetes: Secondary | ICD-10-CM | POA: Diagnosis not present

## 2019-04-23 DIAGNOSIS — Z0000 Encounter for general adult medical examination without abnormal findings: Secondary | ICD-10-CM | POA: Insufficient documentation

## 2019-04-23 DIAGNOSIS — E7800 Pure hypercholesterolemia, unspecified: Secondary | ICD-10-CM | POA: Diagnosis not present

## 2019-04-23 LAB — CBC WITH DIFFERENTIAL/PLATELET
Basophils Absolute: 0 10*3/uL (ref 0.0–0.1)
Basophils Relative: 0.7 % (ref 0.0–3.0)
Eosinophils Absolute: 0.4 10*3/uL (ref 0.0–0.7)
Eosinophils Relative: 6.6 % — ABNORMAL HIGH (ref 0.0–5.0)
HCT: 44.3 % (ref 39.0–52.0)
Hemoglobin: 15.2 g/dL (ref 13.0–17.0)
Lymphocytes Relative: 18.3 % (ref 12.0–46.0)
Lymphs Abs: 1.1 10*3/uL (ref 0.7–4.0)
MCHC: 34.4 g/dL (ref 30.0–36.0)
MCV: 97.8 fl (ref 78.0–100.0)
Monocytes Absolute: 0.5 10*3/uL (ref 0.1–1.0)
Monocytes Relative: 8.3 % (ref 3.0–12.0)
Neutro Abs: 4.1 10*3/uL (ref 1.4–7.7)
Neutrophils Relative %: 66.1 % (ref 43.0–77.0)
Platelets: 114 10*3/uL — ABNORMAL LOW (ref 150.0–400.0)
RBC: 4.53 Mil/uL (ref 4.22–5.81)
RDW: 13 % (ref 11.5–15.5)
WBC: 6.3 10*3/uL (ref 4.0–10.5)

## 2019-04-23 LAB — COMPREHENSIVE METABOLIC PANEL
ALT: 39 U/L (ref 0–53)
AST: 31 U/L (ref 0–37)
Albumin: 4.4 g/dL (ref 3.5–5.2)
Alkaline Phosphatase: 47 U/L (ref 39–117)
BUN: 16 mg/dL (ref 6–23)
CO2: 30 mEq/L (ref 19–32)
Calcium: 8.8 mg/dL (ref 8.4–10.5)
Chloride: 101 mEq/L (ref 96–112)
Creatinine, Ser: 0.79 mg/dL (ref 0.40–1.50)
GFR: 95.36 mL/min (ref >60.00)
Glucose, Bld: 132 mg/dL — ABNORMAL HIGH (ref 70–99)
Potassium: 3.8 mEq/L (ref 3.5–5.1)
Sodium: 141 mEq/L (ref 135–145)
Total Bilirubin: 1 mg/dL (ref 0.2–1.2)
Total Protein: 6.5 g/dL (ref 6.0–8.3)

## 2019-04-23 LAB — HEMOGLOBIN A1C: Hgb A1c MFr Bld: 5.8 % (ref 4.6–6.5)

## 2019-04-23 LAB — LIPID PANEL
Cholesterol: 124 mg/dL (ref 0–200)
HDL: 42.6 mg/dL (ref >39.00)
NonHDL: 81.34
Total CHOL/HDL Ratio: 3
Triglycerides: 342 mg/dL — ABNORMAL HIGH (ref 0.0–149.0)
VLDL: 68.4 mg/dL — ABNORMAL HIGH (ref 0.0–40.0)

## 2019-04-23 LAB — TSH: TSH: 1.06 u[IU]/mL (ref 0.35–4.50)

## 2019-04-23 LAB — LDL CHOLESTEROL, DIRECT: Direct LDL: 42 mg/dL

## 2019-04-23 LAB — URIC ACID: Uric Acid, Serum: 6 mg/dL (ref 4.0–7.8)

## 2019-04-23 LAB — PSA, MEDICARE: PSA: 1.82 ng/ml (ref 0.10–4.00)

## 2019-04-23 MED ORDER — ATORVASTATIN CALCIUM 20 MG PO TABS: ORAL_TABLET | ORAL | 0 refills | Status: DC

## 2019-04-23 NOTE — Assessment & Plan Note (Signed)
PSA today  No change in urinary symptoms

## 2019-04-23 NOTE — Assessment & Plan Note (Signed)
No symptoms  Good bp and cholesterol control

## 2019-04-23 NOTE — Assessment & Plan Note (Signed)
No flares on allopurinol  Uric acid level today

## 2019-04-23 NOTE — Assessment & Plan Note (Signed)
Lab Results  Component Value Date   HGBA1C 5.8 04/23/2019   This is down from 6.0  disc imp of low glycemic diet and wt loss to prevent DM2

## 2019-04-23 NOTE — Assessment & Plan Note (Signed)
No further stroke symptoms Well controlled cholesterol and BP No events on loop recorder

## 2019-04-23 NOTE — Assessment & Plan Note (Signed)
bp in fair control at this time  BP Readings from Last 1 Encounters:  02/12/19 122/80   No changes needed Most recent labs reviewed  Disc lifstyle change with low sodium diet and exercise

## 2019-04-23 NOTE — Progress Notes (Signed)
Subjective:    Patient ID: Jeffrey [name], male    DOB: [birth year], 78 y.o.   MRN: [id number]  HPI  Pt is here for amw as well as annual/gen med examination I have personally reviewed the Medicare Annual Wellness questionnaire and have noted 1. The patient's medical and social history 2. Their use of alcohol, tobacco or illicit drugs 3. Their current medications and supplements 4. The patient's functional ability including ADL's, fall risks, home safety risks and hearing or visual             impairment. 5. Diet and physical activities 6. Evidence for depression or mood disorders  The patients weight, height, BMI have been recorded in the chart and visual acuity is per eye clinic.  I have made referrals, counseling and provided education to the patient based review of the above and I have provided the pt with a written personalized care plan for preventive services. Reviewed and updated provider list, see scanned forms.  See scanned forms.  Routine anticipatory guidance given to patient.  See health maintenance. Colon cancer screening: colonoscopy 4/19 (one polyp-adenoma) His sister had colon cancer  Flu vaccine : 10/17- may consider this fall  Tetanus vaccine 10/14 Td Pneumovax-completed series  Zoster vaccine: has not had  Prostate cancer screening: (he has had prostatitis in the past)   Lab Results  Component Value Date   PSA 1.30 01/11/2018   PSA 1.07 01/17/2017   PSA 7.37 (H) 07/21/2016   no problems lately  Nocturia times one  No frequency or flow problems  Advance directive-he has a living will and POA     Cognitive function addressed- see scanned forms- and if abnormal then additional documentation follows.  No problems at all  Not misplacing things Working and staying active   [place] reviewed  Meds, vitals, and allergies reviewed.   ROS: See HPI.  Otherwise negative.    Weight  Wt Readings from Last 3 Encounters:  04/23/19 180 lb 11.2 oz (82 kg)   12/20/18 177 lb (80.3 kg)  02/14/18 182 lb (82.6 kg)  stable  24.85 kg/m  Exercise- walking and tennis /jogging  Eats healthy most of the time   Hearing/vision : No problems per pt   Hearing Screening   Method: Audiometry   125Hz  250Hz  500Hz  1000Hz  2000Hz  3000Hz  4000Hz  6000Hz  8000Hz   Right ear:   40 40 40  0    Left ear:   40 40 40  0      Visual Acuity Screening   Right eye Left eye Both eyes  Without correction:     With correction: 20/25 20/25 20/25       Falls -none  Fractures-none    PHQ screen :0 Mood is very good   Past h/o cryptogenic stroke -no problems at all  Has loop recorder/no findings   Saw neuro for hand tingling Improved Did wear wrist splints   HTN bp is stable today  No cp or palpitations or headaches or edema  No side effects to medicines  BP Readings from Last 3 Encounters:  02/12/19 122/80  12/20/18 (!) 168/90  02/14/18 (!) 144/76     Pulse Readings from Last 3 Encounters:  12/20/18 (!) 58  02/14/18 (!) 56  01/23/18 (!) 55    Gout  Lab Results  Component Value Date   LABURIC 7.0 01/11/2018   due for labs  On allopurinol  Watches diet No flares   Hyperlipidemia Lab Results  Component Value  Date   CHOL 111 01/11/2018   HDL 44.50 01/11/2018   LDLCALC 25 01/10/2017   LDLDIRECT 34.0 01/11/2018   TRIG 309.0 (H) 01/11/2018   CHOLHDL 3 01/11/2018    Taking atorvastatin 20 mg and diet = takes one every 3 days due to muscle pain and cramps  Due for labs  High triglycerides last time  Not eating a lot of fatty goods   Prediabetes Watches diet carefully Lab Results  Component Value Date   HGBA1C 6.0 01/11/2018   Due for labs   Baseline low platelets in the past Lab Results  Component Value Date   WBC 5.7 12/20/2018   HGB 14.9 12/20/2018   HCT 42.8 12/20/2018   MCV 92.0 12/20/2018   PLT 121 (L) 12/20/2018   no issues with bleeding or clotting   Patient Active Problem List   Diagnosis Date Noted  . Medicare  annual wellness visit, subsequent 04/23/2019  . Paresthesia of hand 02/12/2019  . Left groin pain 02/14/2018  . 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 Cryptogenic stroke ([place]) 11/08/2016  . Blurred vision 11/05/2014  . Prostate cancer screening 11/05/2014  . Family history of colon cancer 08/01/2013  . Colon cancer screening 08/01/2013  . Routine general medical examination at a health care facility 08/01/2013  . PSA, INCREASED 09/07/2010  . 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 Thrombocytopenia ([place]) 01/16/2009  . Hyperlipidemia 06/08/2007  . Gout 06/08/2007  . Essential hypertension 06/08/2007  . Prediabetes 06/08/2007  . SKIN CANCER, HX OF 06/08/2007   Past Medical History:  Diagnosis Date  . DDD (degenerative disc disease)    with epidural injection  . Gastritis    H pylori, partial tx.  EGD 04/2002  . Gout   . History of skin cancer   . Hyperlipidemia   . Hypertension    some white coat component- better at home  . Stroke ([place])   . TIA (transient ischemic attack)    Past Surgical History:  Procedure Laterality Date  . COLONOSCOPY WITH PROPOFOL N/A 01/16/2018   Procedure: COLONOSCOPY WITH PROPOFOL;  Surgeon: Lollie Sails, MD;  Location: [facility] ENDOSCOPY;  Service: Endoscopy;  Laterality: N/A;  . [place]  . LOOP RECORDER INSERTION N/A 12/21/2016   Procedure: Loop Recorder Insertion;  Surgeon: Deboraha Sprang, MD;  Location: [facility];  Service: Cardiovascular;  Laterality: N/A;  . TEE WITHOUT CARDIOVERSION N/A 12/21/2016   Procedure: TRANSESOPHAGEAL ECHOCARDIOGRAM (TEE);  Surgeon: Nelva Bush, MD;  Location: [facility] ORS;  Service: Cardiovascular;  Laterality: N/A;   Social History   Tobacco Use  . Smoking status: Former Smoker    Packs/day: 1.00    Years: 8.00    Pack years: 8.00    Types: Cigarettes    Quit date: 1973    Years since quitting: 47.5  . Smokeless tobacco: Never Used  Substance  Use Topics  . Alcohol use: Yes    Alcohol/week: 7.0 standard drinks    Types: 7 Glasses of wine per week  . Drug use: No   Family History  Problem Relation Age of Onset  . Other Mother        brain tumor  . Skin cancer Mother        suspect melanoma  . Heart attack Father 15  . Colon cancer Sister    No Known Allergies Current Outpatient Medications on File Prior to Visit  Medication Sig Dispense Refill  . allopurinol (ZYLOPRIM)  300 MG tablet Take 1 tablet (300 mg total) by mouth daily. At night 90 tablet 1  . Ascorbic Acid (VITAMIN C) 1000 MG tablet Take 1,000 mg by mouth daily.      Marland Kitchen aspirin EC 81 MG tablet Take 162 mg by mouth 2 (two) times daily.    . bisoprolol (ZEBETA) 5 MG tablet Take 1 and 1/2 tablets by mouth (7.5 mg) once daily 135 tablet 1  . hydrochlorothiazide (HYDRODIURIL) 12.5 MG tablet TAKE 1 TABLET(12.5 MG) BY MOUTH TWICE DAILY 180 tablet 3  . lisinopril (ZESTRIL) 20 MG tablet TAKE 1 TABLET(20 MG) BY MOUTH DAILY 90 tablet 1  . Omega-3 Fatty Acids (FISH OIL PO) Take 1 capsule by mouth daily.     . calcium carbonate (TUMS EX) 750 MG chewable tablet Chew 1 tablet by mouth every 2 (two) hours as needed for heartburn.    . Calcium-Magnesium-Zinc 1000-400-15 MG TABS Take 1 tablet by mouth daily.      . Multiple Vitamin (MULTIVITAMIN) tablet Take 1 tablet by mouth daily.    . potassium chloride (K-DUR) 10 MEQ tablet Take 10 mEq by mouth daily.    Marland Kitchen VITAMIN E PO Take 1 capsule by mouth daily.      No current facility-administered medications on file prior to visit.     Review of Systems  Constitutional: Negative for activity change, appetite change, fatigue, fever and unexpected weight change.  HENT: Negative for congestion, rhinorrhea, sore throat and trouble swallowing.   Eyes: Negative for pain, redness, itching and visual disturbance.  Respiratory: Negative for cough, chest tightness, shortness of breath and wheezing.   Cardiovascular: Negative for chest pain and  palpitations.  Gastrointestinal: Negative for abdominal pain, blood in stool, constipation, diarrhea and nausea.  Endocrine: Negative for cold intolerance, heat intolerance, polydipsia and polyuria.  Genitourinary: Negative for difficulty urinating, dysuria, frequency and urgency.  Musculoskeletal: Negative for arthralgias, joint swelling and myalgias.  Skin: Negative for pallor and rash.  Neurological: Negative for dizziness, tremors, weakness, numbness and headaches.  Hematological: Negative for adenopathy. Does not bruise/bleed easily.  Psychiatric/Behavioral: Negative for decreased concentration and dysphoric mood. The patient is not nervous/anxious.        Objective:   Physical Exam Constitutional:      General: He is not in acute distress.    Appearance: Normal appearance. He is well-developed and normal weight. He is not ill-appearing or diaphoretic.  HENT:     Head: Normocephalic and atraumatic.     Right Ear: Tympanic membrane, ear canal and external ear normal.     Left Ear: Tympanic membrane, ear canal and external ear normal.     Nose: Nose normal.     Mouth/Throat:     Mouth: Mucous membranes are moist.     Pharynx: Oropharynx is clear. No posterior oropharyngeal erythema.  Eyes:     General: No scleral icterus.       Right eye: No discharge.        Left eye: No discharge.     Conjunctiva/sclera: Conjunctivae normal.     Pupils: Pupils are equal, round, and reactive to light.  Neck:     Musculoskeletal: Normal range of motion and neck supple.     Thyroid: No thyromegaly.     Vascular: No carotid bruit or JVD.  Cardiovascular:     Rate and Rhythm: Normal rate and regular rhythm.     Heart sounds: Normal heart sounds. No gallop.   Pulmonary:     Effort:  Pulmonary effort is normal. No respiratory distress.     Breath sounds: Normal breath sounds. No wheezing or rales.     Comments: Good air exch Chest:     Chest wall: No tenderness.  Abdominal:     General:  Bowel sounds are normal. There is no distension or abdominal bruit.     Palpations: Abdomen is soft. There is no mass.     Tenderness: There is no abdominal tenderness.     Hernia: No hernia is present.  Musculoskeletal:        General: No tenderness.     Right lower leg: No edema.     Left lower leg: No edema.  Lymphadenopathy:     Cervical: No cervical adenopathy.  Skin:    General: Skin is warm and dry.     Coloration: Skin is not pale.     Findings: No erythema or rash.     Comments: Tanned/dark complexion Solar lentigines diffusely   Neurological:     Mental Status: He is alert. Mental status is at baseline.     Cranial Nerves: No cranial nerve deficit.     Motor: No weakness or abnormal muscle tone.     Coordination: Coordination normal.     Gait: Gait normal.     Deep Tendon Reflexes: Reflexes are normal and symmetric. Reflexes normal.     Comments: Nl speech  Psychiatric:        Mood and Affect: Mood normal.           Assessment & Plan:   Problem List Items Addressed This Visit      Cardiovascular and Mediastinum   Essential hypertension    bp in fair control at this time  BP Readings from Last 1 Encounters:  02/12/19 122/80   No changes needed Most recent labs reviewed  Disc lifstyle change with low sodium diet and exercise        Relevant Medications   atorvastatin (LIPITOR) 20 MG tablet   Other Relevant Orders   CBC with Differential/Platelet (Completed)   Comprehensive metabolic panel (Completed)   Lipid panel (Completed)   TSH (Completed)   Cryptogenic stroke ([place])    No further stroke symptoms Well controlled cholesterol and BP No events on loop recorder      Relevant Medications   atorvastatin (LIPITOR) 20 MG tablet   Ascending aorta dilation ([facility])    No symptoms  Good bp and cholesterol control       Relevant Medications   atorvastatin (LIPITOR) 20 MG tablet     Other   Hyperlipidemia    Labs today  Now takes lipitor 20 mg  every 3 days due to myalgias  Good diet Disc goals for lipids and reasons to control them Rev last labs with pt Rev low sat fat diet in detail       Relevant Medications   atorvastatin (LIPITOR) 20 MG tablet   Other Relevant Orders   Lipid panel (Completed)   Gout    No flares on allopurinol  Uric acid level today        Relevant Orders   Uric acid (Completed)   Thrombocytopenia ([facility])    Last platelet ct 121 No bleeding/bruising or clotting Cbc today      Prediabetes    Lab Results  Component Value Date   HGBA1C 5.8 04/23/2019   This is down from 6.0  disc imp of low glycemic diet and wt loss to prevent DM2  Relevant Orders   Hemoglobin A1c (Completed)   Family history of colon cancer    Small adenoma on his colonoscopy 4/19  Sister had colon cancer      Routine general medical examination at a health care facility    Reviewed health habits including diet and exercise and skin cancer prevention Reviewed appropriate screening tests for age  Also reviewed health mt list, fam hx and immunization status , as well as social and family history   See HPI Labs reviewed and ordered  No cognitive concerns Pt has advance directive Rev hearing /vision screen psa for prostate screen Discussed shingrix vaccine-is interested if covered  utd colon screening (sister had colon cancer)  Strongly encouraged flu shot in the fall  Commended active lifestyle          Prostate cancer screening    PSA today  No change in urinary symptoms      Relevant Orders   PSA, Medicare (Completed)   Medicare annual wellness visit, subsequent - Primary    Reviewed health habits including diet and exercise and skin cancer prevention Reviewed appropriate screening tests for age  Also reviewed health mt list, fam hx and immunization status , as well as social and family history   See HPI Labs reviewed and ordered  No cognitive concerns Pt has advance directive Rev hearing  /vision screen psa for prostate screen Discussed shingrix vaccine-is interested if covered  utd colon screening (sister had colon cancer)  Strongly encouraged flu shot in the fall  Commended active lifestyle

## 2019-04-23 NOTE — Assessment & Plan Note (Signed)
Labs today  Now takes lipitor 20 mg every 3 days due to myalgias  Good diet Disc goals for lipids and reasons to control them Rev last labs with pt Rev low sat fat diet in detail

## 2019-04-23 NOTE — Assessment & Plan Note (Signed)
Reviewed health habits including diet and exercise and skin cancer prevention Reviewed appropriate screening tests for age  Also reviewed health mt list, fam hx and immunization status , as well as social and family history   See HPI Labs reviewed and ordered  No cognitive concerns Pt has advance directive Rev hearing /vision screen psa for prostate screen Discussed shingrix vaccine-is interested if covered  utd colon screening (sister had colon cancer)  Strongly encouraged flu shot in the fall  Commended active lifestyle

## 2019-04-23 NOTE — Assessment & Plan Note (Signed)
Last platelet ct 121 No bleeding/bruising or clotting Cbc today

## 2019-04-23 NOTE — Assessment & Plan Note (Signed)
Small adenoma on his colonoscopy 4/19  Sister had colon cancer

## 2019-04-23 NOTE — Patient Instructions (Addendum)
Please think about getting a flu shot this fall   If you are interested in the new shingles vaccine (Shingrix) - call your local pharmacy to check on coverage and availability  If affordable, get on a wait list at your pharmacy to get the vaccine. It is a good idea to get it when you    Continue to stay physically and mentally active  Protect yourself from sunburn Get regular rest  Make sure to drink fluids -especially when exercising outdoors

## 2019-04-30 ENCOUNTER — Ambulatory Visit (INDEPENDENT_AMBULATORY_CARE_PROVIDER_SITE_OTHER): Payer: PPO | Admitting: *Deleted

## 2019-04-30 DIAGNOSIS — I639 Cerebral infarction, unspecified: Secondary | ICD-10-CM | POA: Diagnosis not present

## 2019-04-30 LAB — CUP PACEART REMOTE DEVICE CHECK
Date Time Interrogation Session: 20200719221421
Implantable Pulse Generator Implant Date: 20180313

## 2019-05-14 NOTE — Progress Notes (Signed)
Carelink Summary Report / Loop Recorder 

## 2019-05-22 ENCOUNTER — Other Ambulatory Visit: Payer: Self-pay | Admitting: Family Medicine

## 2019-06-01 ENCOUNTER — Ambulatory Visit (INDEPENDENT_AMBULATORY_CARE_PROVIDER_SITE_OTHER): Payer: PPO | Admitting: *Deleted

## 2019-06-01 DIAGNOSIS — I639 Cerebral infarction, unspecified: Secondary | ICD-10-CM | POA: Diagnosis not present

## 2019-06-03 LAB — CUP PACEART REMOTE DEVICE CHECK
Date Time Interrogation Session: 20200821221127
Implantable Pulse Generator Implant Date: 20180313

## 2019-06-08 NOTE — Progress Notes (Signed)
Carelink Summary Report / Loop Recorder 

## 2019-07-04 ENCOUNTER — Ambulatory Visit (INDEPENDENT_AMBULATORY_CARE_PROVIDER_SITE_OTHER): Payer: PPO | Admitting: *Deleted

## 2019-07-04 DIAGNOSIS — I639 Cerebral infarction, unspecified: Secondary | ICD-10-CM

## 2019-07-05 LAB — CUP PACEART REMOTE DEVICE CHECK
Date Time Interrogation Session: 20200923221040
Implantable Pulse Generator Implant Date: 20180313

## 2019-07-10 NOTE — Progress Notes (Signed)
Carelink Summary Report / Loop Recorder 

## 2019-08-06 ENCOUNTER — Ambulatory Visit (INDEPENDENT_AMBULATORY_CARE_PROVIDER_SITE_OTHER): Payer: PPO | Admitting: *Deleted

## 2019-08-06 DIAGNOSIS — I639 Cerebral infarction, unspecified: Secondary | ICD-10-CM

## 2019-08-07 LAB — CUP PACEART REMOTE DEVICE CHECK
Date Time Interrogation Session: 20201026220957
Implantable Pulse Generator Implant Date: 20180313

## 2019-08-24 NOTE — Progress Notes (Signed)
Carelink Summary Report / Loop Recorder 

## 2019-09-09 LAB — CUP PACEART REMOTE DEVICE CHECK
Date Time Interrogation Session: 20201128174240
Implantable Pulse Generator Implant Date: 20180313

## 2019-09-10 ENCOUNTER — Ambulatory Visit (INDEPENDENT_AMBULATORY_CARE_PROVIDER_SITE_OTHER): Payer: PPO | Admitting: *Deleted

## 2019-09-10 DIAGNOSIS — I639 Cerebral infarction, unspecified: Secondary | ICD-10-CM | POA: Diagnosis not present

## 2019-10-03 NOTE — Progress Notes (Signed)
ILR remote 

## 2019-10-11 ENCOUNTER — Ambulatory Visit (INDEPENDENT_AMBULATORY_CARE_PROVIDER_SITE_OTHER): Payer: PPO | Admitting: *Deleted

## 2019-10-11 DIAGNOSIS — I639 Cerebral infarction, unspecified: Secondary | ICD-10-CM

## 2019-10-12 LAB — CUP PACEART REMOTE DEVICE CHECK
Date Time Interrogation Session: 20201231203407
Implantable Pulse Generator Implant Date: 20180313

## 2019-11-08 ENCOUNTER — Ambulatory Visit: Payer: PPO

## 2019-11-12 ENCOUNTER — Ambulatory Visit (INDEPENDENT_AMBULATORY_CARE_PROVIDER_SITE_OTHER): Payer: PPO | Admitting: *Deleted

## 2019-11-12 DIAGNOSIS — I639 Cerebral infarction, unspecified: Secondary | ICD-10-CM | POA: Diagnosis not present

## 2019-11-12 LAB — CUP PACEART REMOTE DEVICE CHECK
Date Time Interrogation Session: 20210131235114
Implantable Pulse Generator Implant Date: 20180313

## 2019-11-12 NOTE — Progress Notes (Signed)
ILR Remote 

## 2019-11-16 ENCOUNTER — Ambulatory Visit: Payer: PPO | Attending: Internal Medicine

## 2019-11-16 DIAGNOSIS — Z23 Encounter for immunization: Secondary | ICD-10-CM | POA: Insufficient documentation

## 2019-11-16 NOTE — Progress Notes (Signed)
   Covid-19 Vaccination Clinic  Name:  Jeffrey [name]    MRN: [id number] DOB: 10-02-1941  11/16/2019  [name] was observed post Covid-19 immunization for 15 minutes without incidence. He was provided with Vaccine Information Sheet and instruction to access the V-Safe system.   [name] was instructed to call 911 with any severe reactions post vaccine: Marland Kitchen Difficulty breathing  . Swelling of your face and throat  . A fast heartbeat  . A bad rash all over your body  . Dizziness and weakness    Immunizations Administered    Name Date Dose VIS Date Route   Pfizer COVID-19 Vaccine 11/16/2019  4:31 PM 0.3 mL 09/21/2019 Intramuscular   Manufacturer: [place]   Lot: [id number]   [place]: [id number]

## 2019-12-10 ENCOUNTER — Telehealth: Payer: Self-pay | Admitting: Family Medicine

## 2019-12-10 NOTE — Progress Notes (Signed)
  Chronic Care Management   Outreach Note  12/10/2019 Name: Jeffrey [name] MRN: [id number] DOB: 1941/01/05  Referred by: Tower, Wynelle Fanny, MD Reason for referral : No chief complaint on file.   An unsuccessful telephone outreach was attempted today. The patient was referred to the pharmacist for assistance with care management and care coordination.   Follow Up Plan:   Raynicia Dukes [employer] Scheduler

## 2019-12-11 ENCOUNTER — Ambulatory Visit: Payer: PPO | Attending: Internal Medicine

## 2019-12-11 ENCOUNTER — Telehealth: Payer: Self-pay | Admitting: Family Medicine

## 2019-12-11 DIAGNOSIS — G8929 Other chronic pain: Secondary | ICD-10-CM | POA: Diagnosis not present

## 2019-12-11 DIAGNOSIS — M25552 Pain in left hip: Secondary | ICD-10-CM | POA: Diagnosis not present

## 2019-12-11 DIAGNOSIS — M1612 Unilateral primary osteoarthritis, left hip: Secondary | ICD-10-CM | POA: Diagnosis not present

## 2019-12-11 DIAGNOSIS — Z23 Encounter for immunization: Secondary | ICD-10-CM | POA: Insufficient documentation

## 2019-12-11 NOTE — Progress Notes (Signed)
   Covid-19 Vaccination Clinic  Name:  Jeffrey [name]    MRN: [id number] DOB: 08/01/1941  12/11/2019  Mr. Guterrez was observed post Covid-19 immunization for 15 minutes without incident. He was provided with Vaccine Information Sheet and instruction to access the V-Safe system.   Mr. Piersall was instructed to call 911 with any severe reactions post vaccine: Marland Kitchen Difficulty breathing  . Swelling of face and throat  . A fast heartbeat  . A bad rash all over body  . Dizziness and weakness   Immunizations Administered    Name Date Dose VIS Date Route   Pfizer COVID-19 Vaccine 12/11/2019  4:17 PM 0.3 mL 09/21/2019 Intramuscular   Manufacturer: [place]   Lot: [id number]   [place]: [id number]

## 2019-12-11 NOTE — Chronic Care Management (AMB) (Signed)
Chronic Care Management   Note  12/11/2019 Name: Jeffrey [name] MRN: [id number] DOB: October 12, 1940  Jeffrey [name] is a 79 y.o. year old male who is a primary care patient of Tower, Wynelle Fanny, MD. I reached out to [name] by phone today in response to a referral sent by Jeffrey [name]'s PCP, Tower, Wynelle Fanny, MD.   [name] was given information about Chronic Care Management services today including:  1. CCM service includes personalized support from designated clinical staff supervised by his physician, including individualized plan of care and coordination with other care providers 2. 24/7 contact phone numbers for assistance for urgent and routine care needs. 3. Service will only be billed when office clinical staff spend 20 minutes or more in a month to coordinate care. 4. Only one practitioner may furnish and bill the service in a calendar month. 5. The patient may stop CCM services at any time (effective at the end of the month) by phone call to the office staff. 6. The patient will be responsible for cost sharing (co-pay) of up to 20% of the service fee (after annual deductible is met).  Patient agreed to services and verbal consent obtained.   Follow up plan:   Raynicia Dukes [employer] Scheduler

## 2019-12-13 ENCOUNTER — Ambulatory Visit (INDEPENDENT_AMBULATORY_CARE_PROVIDER_SITE_OTHER): Payer: PPO | Admitting: *Deleted

## 2019-12-13 DIAGNOSIS — I639 Cerebral infarction, unspecified: Secondary | ICD-10-CM

## 2019-12-13 LAB — CUP PACEART REMOTE DEVICE CHECK
Date Time Interrogation Session: 20210304021607
Implantable Pulse Generator Implant Date: 20180313

## 2019-12-13 NOTE — Progress Notes (Signed)
ILR Remote 

## 2019-12-31 ENCOUNTER — Telehealth: Payer: PPO

## 2019-12-31 ENCOUNTER — Telehealth: Payer: Self-pay

## 2019-12-31 DIAGNOSIS — E7800 Pure hypercholesterolemia, unspecified: Secondary | ICD-10-CM

## 2019-12-31 DIAGNOSIS — I10 Essential (primary) hypertension: Secondary | ICD-10-CM

## 2019-12-31 NOTE — Chronic Care Management (AMB) (Deleted)
Chronic Care Management Pharmacy  Name: Jeffrey [name]  MRN: [id number] DOB: Sep 02, 1941  Chief Complaint/ HPI  Jeffrey [name],  79 y.o. , male presents for their Initial CCM visit with the clinical pharmacist via telephone.  PCP : Abner Greenspan, MD  Their chronic conditions include: hypertension, hyperlipidemia, chronic pain, GERD, gout  Patient concerns:  Office Visits:  04/23/19: Tower-  AWV, cont current meds   Consult Visit:  12/11/19: Chronic hip pain - steroid injection  No Known Allergies  Medications: Outpatient Encounter Medications as of 12/31/2019  Medication Sig  . allopurinol (ZYLOPRIM) 300 MG tablet Take 1 tablet (300 mg total) by mouth daily. At night  . Ascorbic Acid (VITAMIN C) 1000 MG tablet Take 1,000 mg by mouth daily.    Marland Kitchen aspirin EC 81 MG tablet Take 162 mg by mouth 2 (two) times daily.  Marland Kitchen atorvastatin (LIPITOR) 20 MG tablet TAKE 1 TABLET(20 MG( every 3 days  . bisoprolol (ZEBETA) 5 MG tablet Take 1 and 1/2 tablets by mouth (7.5 mg) once daily  . calcium carbonate (TUMS EX) 750 MG chewable tablet Chew 1 tablet by mouth every 2 (two) hours as needed for heartburn.  . Calcium-Magnesium-Zinc 1000-400-15 MG TABS Take 1 tablet by mouth daily.    . hydrochlorothiazide (HYDRODIURIL) 12.5 MG tablet TAKE 1 TABLET(12.5 MG) BY MOUTH TWICE DAILY  . lisinopril (ZESTRIL) 20 MG tablet TAKE 1 TABLET(20 MG) BY MOUTH DAILY  . Multiple Vitamin (MULTIVITAMIN) tablet Take 1 tablet by mouth daily.  . Omega-3 Fatty Acids (FISH OIL PO) Take 1 capsule by mouth daily.   . potassium chloride (K-DUR) 10 MEQ tablet Take 10 mEq by mouth daily.  Marland Kitchen VITAMIN E PO Take 1 capsule by mouth daily.    No facility-administered encounter medications on file as of 12/31/2019.    Current Diagnosis/Assessment: Hypertension   CMP Latest Ref Rng & Units 04/23/2019 12/20/2018 01/11/2018  Glucose 70 - 99 mg/dL 132(H) 107(H) 119(H)  BUN 6 - 23 mg/dL 16 15 11   Creatinine 0.40 - 1.50 mg/dL 0.79 0.56(L)  0.79  Sodium 135 - 145 mEq/L 141 139 140  Potassium 3.5 - 5.1 mEq/L 3.8 3.8 4.2  Chloride 96 - 112 mEq/L 101 104 103  CO2 19 - 32 mEq/L 30 26 31   Calcium 8.4 - 10.5 mg/dL 8.8 8.8(L) 9.2  Total Protein 6.0 - 8.3 g/dL 6.5 6.9 6.3  Total Bilirubin 0.2 - 1.2 mg/dL 1.0 1.1 0.6  Alkaline Phos 39 - 117 U/L 47 45 50  AST 0 - 37 U/L 31 42(H) 39(H)  ALT 0 - 53 U/L 39 43 46   Office blood pressures are: BP Readings from Last 3 Encounters:  02/12/19 122/80  12/20/18 (!) 168/90  02/14/18 (!) 144/76   BP today is:  {CHL HP UPSTREAM Pharmacist BP ranges:[phone]}  Patient has failed these meds in the past:  Patient checks BP at home {CHL HP BP Monitoring Frequency:[phone]} Patient home BP readings are ranging:   Patient is currently {CHL Controlled/Uncontrolled:[phone]} on the following medications:   Bisoprolol 5 mg - 1 and 1/2 tablet daily  HCTZ 12.5 mg - 1 tablet BID   Potassium chloride 10 mEq - 1 daily   Lisinopril 20 mg - 1 tablet daily  We discussed:  {CHL HP Upstream Pharmacy [phone]}  Plan: Continue {CHL HP [facility]stream Pharmacy Plans:[phone]}  Hyperlipidemia   Lipid Panel     Component Value Date/Time   CHOL 124 04/23/2019 1128   TRIG 342.0 (H)  04/23/2019 1128   HDL 42.60 04/23/2019 1128   CHOLHDL 3 04/23/2019 1128   VLDL 68.4 (H) 04/23/2019 1128   LDLCALC 25 01/10/2017 0801   LDLDIRECT 42.0 04/23/2019 1128     The ASCVD Risk score (Goff DC Jr., et al., 2013) failed to calculate for the following reasons:   The patient has a prior MI or stroke diagnosis   Patient has failed these meds in past: *** Patient is currently {CHL Controlled/Uncontrolled:[phone]} on the following medications:   Aspirin 81 mg - 1 tablet daily  Atorvastatin 20 mg - 1 tablet every 3 days  Omega 3-fatty acids - 1 capsule daily   We discussed:  {CHL HP Upstream Pharmacy discussion:[phone]}  Plan: Continue {CHL HP Upstream Pharmacy Plans:[phone]}   Gout    Patient has failed these meds in past:  Patient is currently {CHL Controlled/Uncontrolled:[phone]} on the following medications:   Allopurinol 300 mg - 1 tablet daily at night  We discussed:   Plan: Continue {CHL HP Upstream Pharmacy [id number]   Vaccines   Reviewed and discussed patient's vaccination history.    Immunization History  Administered Date(s) Administered  . Hepatitis A, Adult 07/21/2016  . Influenza Split 08/25/2011  . Influenza,inj,Quad PF,6+ Mos 08/01/2013, 11/05/2014, 07/21/2016  . PFIZER SARS-COV-2 Vaccination 11/16/2019, 12/11/2019  . Pneumococcal Conjugate-13 11/05/2014  . Pneumococcal Polysaccharide-23 08/25/2011  . Td 04/01/2003, 08/01/2013    Plan: Recommended patient receive *** vaccine in *** office/pharmacy.    Medication Management  OTCs: Vitamin C 1000 mg, multivitamin, Tums extra strength, calcium-mag-zing, vitamin E,   Pharmacy/Benefits: [facility]   Adherence:  Social support:  Affordability:  CCM Follow Up:  Debbora Dus, PharmD Clinical Pharmacist [facility] at [facility] [phone]

## 2019-12-31 NOTE — Telephone Encounter (Signed)
I would like to request a referral for Jeffrey [name] to chronic care management pharmacy services for the following conditions:   Essential hypertension, benign  [I10]  Hyperlipidemia [E78.5]  Debbora Dus, PharmD Clinical Pharmacist [facility] at [facility] [phone]

## 2019-12-31 NOTE — Chronic Care Management (AMB) (Deleted)
Chronic Care Management Pharmacy  Name: Jeffrey [name]  MRN: [id number] DOB: [birth year]  Chief Complaint/ HPI  Jeffrey [name],  79 y.o. , male presents for their Initial CCM visit with the clinical pharmacist via telephone.  PCP : Abner Greenspan, MD  Their chronic conditions include: hypertension, hyperlipidemia, chronic pain, GERD, gout  Patient concerns:  Office Visits:  04/23/19: Tower-  AWV, cont current meds   Consult Visit:  12/11/19: Chronic hip pain - steroid injection  No Known Allergies  Medications: Outpatient Encounter Medications as of 01/03/2020  Medication Sig  . allopurinol (ZYLOPRIM) 300 MG tablet Take 1 tablet (300 mg total) by mouth daily. At night  . Ascorbic Acid (VITAMIN C) 1000 MG tablet Take 1,000 mg by mouth daily.    Marland Kitchen aspirin EC 81 MG tablet Take 162 mg by mouth 2 (two) times daily.  Marland Kitchen atorvastatin (LIPITOR) 20 MG tablet TAKE 1 TABLET(20 MG( every 3 days  . bisoprolol (ZEBETA) 5 MG tablet Take 1 and 1/2 tablets by mouth (7.5 mg) once daily  . calcium carbonate (TUMS EX) 750 MG chewable tablet Chew 1 tablet by mouth every 2 (two) hours as needed for heartburn.  . Calcium-Magnesium-Zinc 1000-400-15 MG TABS Take 1 tablet by mouth daily.    . hydrochlorothiazide (HYDRODIURIL) 12.5 MG tablet TAKE 1 TABLET(12.5 MG) BY MOUTH TWICE DAILY  . lisinopril (ZESTRIL) 20 MG tablet TAKE 1 TABLET(20 MG) BY MOUTH DAILY  . Multiple Vitamin (MULTIVITAMIN) tablet Take 1 tablet by mouth daily.  . Omega-3 Fatty Acids (FISH OIL PO) Take 1 capsule by mouth daily.   . potassium chloride (K-DUR) 10 MEQ tablet Take 10 mEq by mouth daily.  Marland Kitchen VITAMIN E PO Take 1 capsule by mouth daily.    No facility-administered encounter medications on file as of 01/03/2020.    Current Diagnosis/Assessment: Hypertension   CMP Latest Ref Rng & Units 04/23/2019 12/20/2018 01/11/2018  Glucose 70 - 99 mg/dL 132(H) 107(H) 119(H)  BUN 6 - 23 mg/dL 16 15 11   Creatinine 0.40 - 1.50 mg/dL 0.79 0.56(L)  0.79  Sodium 135 - 145 mEq/L 141 139 140  Potassium 3.5 - 5.1 mEq/L 3.8 3.8 4.2  Chloride 96 - 112 mEq/L 101 104 103  CO2 19 - 32 mEq/L 30 26 31   Calcium 8.4 - 10.5 mg/dL 8.8 8.8(L) 9.2  Total Protein 6.0 - 8.3 g/dL 6.5 6.9 6.3  Total Bilirubin 0.2 - 1.2 mg/dL 1.0 1.1 0.6  Alkaline Phos 39 - 117 U/L 47 45 50  AST 0 - 37 U/L 31 42(H) 39(H)  ALT 0 - 53 U/L 39 43 46   Office blood pressures are: BP Readings from Last 3 Encounters:  02/12/19 122/80  12/20/18 (!) 168/90  02/14/18 (!) 144/76   BP today is:  {CHL HP UPSTREAM Pharmacist BP ranges:[id number]}  Patient has failed these meds in the past:  Patient checks BP at home {CHL HP BP Monitoring Frequency:[phone]} Patient home BP readings are ranging:   Patient is currently {CHL Controlled/Uncontrolled:[phone]} on the following medications:   Bisoprolol 5 mg - 1 and 1/2 tablet daily  HCTZ 12.5 mg - 1 tablet BID   Potassium chloride 10 mEq - 1 daily   Lisinopril 20 mg - 1 tablet daily  We discussed:  {CHL HP Upstream Pharmacy discussion:[phone]}  Plan: Continue {CHL HP Upstream Pharmacy Plans:[phone]}  Hyperlipidemia   Lipid Panel     Component Value Date/Time   CHOL 124 04/23/2019 1128   TRIG 342.0 (H)  04/23/2019 1128   HDL 42.60 04/23/2019 1128   CHOLHDL 3 04/23/2019 1128   VLDL 68.4 (H) 04/23/2019 1128   LDLCALC 25 01/10/2017 0801   LDLDIRECT 42.0 04/23/2019 1128     The ASCVD Risk score (Goff DC Jr., et al., 2013) failed to calculate for the following reasons:   The patient has a prior MI or stroke diagnosis   Patient has failed these meds in past: *** Patient is currently {CHL Controlled/Uncontrolled:[phone]} on the following medications:   Aspirin 81 mg - 1 tablet daily  Atorvastatin 20 mg - 1 tablet every 3 days  Omega 3-fatty acids - 1 capsule daily   We discussed:  {CHL HP Upstream Pharmacy discussion:[phone]}  Plan: Continue {CHL HP Upstream Pharmacy [phone]}   Gout     Patient has failed these meds in past:  Patient is currently {CHL Controlled/Uncontrolled:[phone]} on the following medications:   Allopurinol 300 mg - 1 tablet daily at night  We discussed:   Plan: Continue {CHL HP Upstream Pharmacy PH:[id number]   Vaccines   Reviewed and discussed patient's vaccination history.    Immunization History  Administered Date(s) Administered  . Hepatitis A, Adult 07/21/2016  . Influenza Split 08/25/2011  . Influenza,inj,Quad PF,6+ Mos 08/01/2013, 11/05/2014, 07/21/2016  . PFIZER SARS-COV-2 Vaccination 11/16/2019, 12/11/2019  . Pneumococcal Conjugate-13 11/05/2014  . Pneumococcal Polysaccharide-23 08/25/2011  . Td 04/01/2003, 08/01/2013    Plan: Recommended patient receive *** vaccine in *** office/pharmacy.    Medication Management  OTCs: Vitamin C 1000 mg, multivitamin, Tums extra strength, calcium-mag-zing, vitamin E,   Pharmacy/Benefits: [facility]   Adherence:  Social support:  Affordability:  CCM Follow Up:  Debbora Dus, PharmD Clinical Pharmacist [facility] [phone]

## 2020-01-03 ENCOUNTER — Telehealth: Payer: PPO

## 2020-01-14 ENCOUNTER — Ambulatory Visit (INDEPENDENT_AMBULATORY_CARE_PROVIDER_SITE_OTHER): Payer: PPO | Admitting: *Deleted

## 2020-01-14 DIAGNOSIS — I639 Cerebral infarction, unspecified: Secondary | ICD-10-CM | POA: Diagnosis not present

## 2020-01-14 LAB — CUP PACEART REMOTE DEVICE CHECK
Date Time Interrogation Session: 20210404031802
Implantable Pulse Generator Implant Date: 20180313

## 2020-01-15 NOTE — Progress Notes (Signed)
ILR Remote 

## 2020-01-21 ENCOUNTER — Other Ambulatory Visit: Payer: Self-pay | Admitting: *Deleted

## 2020-01-21 MED ORDER — LISINOPRIL 20 MG PO TABS: ORAL_TABLET | ORAL | 0 refills | Status: DC

## 2020-01-22 ENCOUNTER — Telehealth: Payer: Self-pay

## 2020-01-22 MED ORDER — BISOPROLOL FUMARATE 5 MG PO TABS: ORAL_TABLET | ORAL | 0 refills | Status: DC

## 2020-01-22 MED ORDER — ALLOPURINOL 300 MG PO TABS: 300.0000 mg | ORAL_TABLET | Freq: Every day | ORAL | 0 refills | Status: DC

## 2020-01-22 NOTE — Telephone Encounter (Signed)
lmom to schedule

## 2020-01-22 NOTE — Addendum Note (Signed)
Addended by: Tammi Sou on: 01/22/2020 03:03 PM   Modules accepted: Orders

## 2020-01-22 NOTE — Telephone Encounter (Signed)
-----   Message from Janan Ridge, [place] sent at 01/22/2020 10:45 AM EDT ----- Jeffrey [name] is requesting refill for Hydrochlorothiazide 12.5MG .   Patient will need an appointment for further refills, Thanks !

## 2020-01-23 NOTE — Telephone Encounter (Signed)
Attempted to schedule.  LMOV to call office.  ° °

## 2020-01-23 NOTE — Telephone Encounter (Signed)
-----   Message from Jeffrey [name], [place] sent at 01/22/2020 10:45 AM EDT ----- Anders Simmonds is requesting refill for Hydrochlorothiazide 12.5MG .   Patient will need an appointment for further refills, Thanks !

## 2020-02-04 DIAGNOSIS — R0902 Hypoxemia: Secondary | ICD-10-CM | POA: Diagnosis not present

## 2020-02-04 DIAGNOSIS — R0602 Shortness of breath: Secondary | ICD-10-CM | POA: Diagnosis not present

## 2020-02-05 DIAGNOSIS — J189 Pneumonia, unspecified organism: Secondary | ICD-10-CM | POA: Diagnosis not present

## 2020-02-05 DIAGNOSIS — I110 Hypertensive heart disease with heart failure: Secondary | ICD-10-CM | POA: Diagnosis not present

## 2020-02-05 DIAGNOSIS — E7800 Pure hypercholesterolemia, unspecified: Secondary | ICD-10-CM | POA: Diagnosis not present

## 2020-02-05 DIAGNOSIS — Z20822 Contact with and (suspected) exposure to covid-19: Secondary | ICD-10-CM | POA: Diagnosis not present

## 2020-02-05 DIAGNOSIS — R05 Cough: Secondary | ICD-10-CM | POA: Diagnosis not present

## 2020-02-05 DIAGNOSIS — R0602 Shortness of breath: Secondary | ICD-10-CM | POA: Diagnosis not present

## 2020-02-05 DIAGNOSIS — I088 Other rheumatic multiple valve diseases: Secondary | ICD-10-CM | POA: Diagnosis not present

## 2020-02-05 DIAGNOSIS — I5021 Acute systolic (congestive) heart failure: Secondary | ICD-10-CM | POA: Diagnosis not present

## 2020-02-05 DIAGNOSIS — I10 Essential (primary) hypertension: Secondary | ICD-10-CM | POA: Diagnosis not present

## 2020-02-05 DIAGNOSIS — R918 Other nonspecific abnormal finding of lung field: Secondary | ICD-10-CM | POA: Diagnosis not present

## 2020-02-05 DIAGNOSIS — R9431 Abnormal electrocardiogram [ECG] [EKG]: Secondary | ICD-10-CM | POA: Diagnosis not present

## 2020-02-05 DIAGNOSIS — I5030 Unspecified diastolic (congestive) heart failure: Secondary | ICD-10-CM | POA: Diagnosis not present

## 2020-02-05 DIAGNOSIS — Q211 Atrial septal defect: Secondary | ICD-10-CM | POA: Diagnosis not present

## 2020-02-05 DIAGNOSIS — I5033 Acute on chronic diastolic (congestive) heart failure: Secondary | ICD-10-CM | POA: Diagnosis not present

## 2020-02-06 NOTE — Telephone Encounter (Signed)
3 attempts to schedule fu appt from recall list.   Deleting recall.   

## 2020-02-06 NOTE — Telephone Encounter (Signed)
Attempted to schedule.  LMOV to call office.  ° °

## 2020-02-13 ENCOUNTER — Telehealth: Payer: Self-pay

## 2020-02-13 NOTE — Telephone Encounter (Signed)
Unable to speak with patient to inform of disconnected monitor. °

## 2020-02-14 LAB — CUP PACEART REMOTE DEVICE CHECK
Date Time Interrogation Session: 20210505032221
Implantable Pulse Generator Implant Date: 20180313

## 2020-02-18 ENCOUNTER — Ambulatory Visit (INDEPENDENT_AMBULATORY_CARE_PROVIDER_SITE_OTHER): Payer: PPO | Admitting: *Deleted

## 2020-02-18 DIAGNOSIS — I639 Cerebral infarction, unspecified: Secondary | ICD-10-CM | POA: Diagnosis not present

## 2020-02-18 NOTE — Progress Notes (Signed)
Carelink Summary Report / Loop Recorder 

## 2020-02-20 ENCOUNTER — Telehealth: Payer: Self-pay | Admitting: Family Medicine

## 2020-02-20 NOTE — Chronic Care Management (AMB) (Signed)
  Chronic Care Management   Outreach Note  02/20/2020 Name: SARATH SULLINGER MRN: [id number] DOB: 15-Feb-1941  Referred by: Tower, Wynelle Fanny, MD Reason for referral : Chronic Care Management   An unsuccessful telephone outreach was attempted today. The patient was referred to the pharmacist for assistance with care management and care coordination.   Follow Up Plan:   [place]

## 2020-02-20 NOTE — Chronic Care Management (AMB) (Signed)
  Chronic Care Management   Note  02/20/2020 Name: Jeffrey [name] MRN: [id number] DOB: 08/24/1941  [name] is a 79 y.o. year old male who is a primary care patient of Tower, Wynelle Fanny, MD. I reached out to [name] by phone today in response to a referral sent by [name]'s PCP, Tower, Wynelle Fanny, MD.   [name] was given information about Chronic Care Management services today including:  1. CCM service includes personalized support from designated clinical staff supervised by his physician, including individualized plan of care and coordination with other care providers 2. 24/7 contact phone numbers for assistance for urgent and routine care needs. 3. Service will only be billed when office clinical staff spend 20 minutes or more in a month to coordinate care. 4. Only one practitioner may furnish and bill the service in a calendar month. 5. The patient may stop CCM services at any time (effective at the end of the month) by phone call to the office staff.   Patient agreed to services and verbal consent obtained.   Follow up plan:   [name]

## 2020-02-22 ENCOUNTER — Ambulatory Visit (INDEPENDENT_AMBULATORY_CARE_PROVIDER_SITE_OTHER)
Admission: RE | Admit: 2020-02-22 | Discharge: 2020-02-22 | Disposition: A | Discharge status: Home / Self Care | Payer: PPO | Source: Ambulatory Visit | Attending: Family Medicine | Admitting: Family Medicine

## 2020-02-22 ENCOUNTER — Other Ambulatory Visit: Payer: Self-pay

## 2020-02-22 ENCOUNTER — Ambulatory Visit (INDEPENDENT_AMBULATORY_CARE_PROVIDER_SITE_OTHER): Payer: PPO | Admitting: Family Medicine

## 2020-02-22 ENCOUNTER — Telehealth: Payer: Self-pay

## 2020-02-22 ENCOUNTER — Encounter: Payer: Self-pay | Admitting: Family Medicine

## 2020-02-22 VITALS — BP 138/80 | HR 54 | Temp 97.8°F | Ht 73.0 in | Wt 178.3 lb

## 2020-02-22 DIAGNOSIS — J189 Pneumonia, unspecified organism: Secondary | ICD-10-CM | POA: Diagnosis not present

## 2020-02-22 DIAGNOSIS — I509 Heart failure, unspecified: Secondary | ICD-10-CM | POA: Diagnosis not present

## 2020-02-22 DIAGNOSIS — I77810 Thoracic aortic ectasia: Secondary | ICD-10-CM

## 2020-02-22 DIAGNOSIS — I5031 Acute diastolic (congestive) heart failure: Secondary | ICD-10-CM

## 2020-02-22 DIAGNOSIS — D696 Thrombocytopenia, unspecified: Secondary | ICD-10-CM | POA: Diagnosis not present

## 2020-02-22 DIAGNOSIS — I10 Essential (primary) hypertension: Secondary | ICD-10-CM

## 2020-02-22 LAB — BASIC METABOLIC PANEL
BUN: 21 mg/dL (ref 6–23)
CO2: 32 mEq/L (ref 19–32)
Calcium: 8.8 mg/dL (ref 8.4–10.5)
Chloride: 103 mEq/L (ref 96–112)
Creatinine, Ser: 0.78 mg/dL (ref 0.40–1.50)
GFR: 96.56 mL/min (ref >60.00)
Glucose, Bld: 125 mg/dL — ABNORMAL HIGH (ref 70–99)
Potassium: 4 mEq/L (ref 3.5–5.1)
Sodium: 140 mEq/L (ref 135–145)

## 2020-02-22 MED ORDER — TORSEMIDE 20 MG PO TABS: 20.0000 mg | ORAL_TABLET | Freq: Every day | ORAL | 1 refills | Status: DC

## 2020-02-22 NOTE — Telephone Encounter (Signed)
Received a fax from [place] asking to change Torsemide to 90 day supply instead of 30 days that was sent in today. Ok to fill at 90 tablets?

## 2020-02-22 NOTE — Telephone Encounter (Signed)
I was hesitant to do this because I am unsure how long he will need to be on it - I want to keep him on it until he sees cardiology  = but then unsure if he will stay on it  If he absolutely needs it changed to 90 however, send 90 with no refills  Thanks

## 2020-02-22 NOTE — Progress Notes (Signed)
Subjective:    Patient ID: Jeffrey [name], male    DOB: 1940/11/03, 79 y.o.   MRN: [id number]  This visit occurred during the SARS-CoV-2 public health emergency.  Safety protocols were in place, including screening questions prior to the visit, additional usage of staff PPE, and extensive cleaning of exam room while observing appropriate contact time as indicated for disinfecting solutions.    HPI Pt presents for f/u of hospitalization in [place] for pneumonia   He woke up about midnight and could not breathe well - quite sudden   Wt Readings from Last 3 Encounters:  02/22/20 178 lb 4.8 oz (80.9 kg)  04/23/19 180 lb 11.2 oz (82 kg)  12/20/18 177 lb (80.3 kg)   23.52 kg/m   Was hospitalized from 02/05/20 to 02/06/20  For shortness of breath  cxr showed scattered infiltrates in lower lungs with small effusions   tx with doxy and azithro and 02    He has had his pna vaccines  Also covid vaccine series  Was tested negative for covid  Discharged and 02 was up to 93%  Sent out on omnicef   Had echocardiogram showing grade 2 diastolic dysfunction and mild LFH EF good at 55-60%  Sent home with torsemide   Labs: LABS: Lab Results  Component Value Date  NA 142 02/05/2020  K 3.9 02/05/2020  CL 105 02/05/2020  CO2 26 02/05/2020  BUN 15 02/05/2020  CREATININE 0.68 02/05/2020  GLU 120 (H) 02/05/2020  CA 8.6 02/05/2020   Lab Results  Component Value Date  WBC 5.5 02/05/2020  HGB 13.3 02/05/2020  HCT 39.5 02/05/2020  PLT 100 (L) 02/05/2020  MCV 98.3 (H) 02/05/2020   D dimer neg -normal at 0.42  BNP was elevated at 723.9  Blood culture negative     CXR XR CHEST 1 VW  Narrative  EXAM: XR Chest, 1 View.   CLINICAL HISTORY: SOB (Tech: sac ) sob  COMPARISON: None provided.  FINDINGS:  LUNGS AND PLEURA: Moderate scattered infiltrates identified throughout both lungs mainly in the lower lungs. Mild bilateral effusions. No pneumothorax.   HEART AND  MEDIASTINUM: The heart size and mediastinal contours are normal.   BONES: No acute osseous abnormality.   IMPRESSION:  Moderate scattered infiltrates identified throughout both lungs mainly concentrated in the lower lungs. Mild bilateral effusions.  He has a house in [place]   His cardiologist is Dr End on [address]    Today:  Feels fine  Sob is entirely gone  Very little cough to start-none now  [name] just a little tired but mostly back to normal   No swelling in ankles now or then   BP Readings from Last 3 Encounters:  02/22/20 (!) 142/84  02/12/19 122/80  12/20/18 (!) 168/90  re check after sitting BP: 138/80   Pulse Readings from Last 3 Encounters:  02/22/20 (!) 54  12/20/18 (!) 58  02/14/18 (!) 56   Patient Active Problem List   Diagnosis Date Noted  . Pneumonia 02/22/2020  . CHF (congestive heart failure) ([place]) 02/22/2020  . Medicare annual wellness visit, subsequent 04/23/2019  . Paresthesia of hand 02/12/2019  . Left groin pain 02/14/2018  . 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 Cryptogenic stroke ([place]) 11/08/2016  . Blurred vision 11/05/2014  . Prostate cancer screening 11/05/2014  . Family history of colon cancer 08/01/2013  . Colon cancer screening 08/01/2013  . Routine general medical examination at a health care facility 08/01/2013  .  PSA, INCREASED 09/07/2010  . 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 Thrombocytopenia ([place]) 01/16/2009  . Hyperlipidemia 06/08/2007  . Gout 06/08/2007  . Essential hypertension 06/08/2007  . Prediabetes 06/08/2007  . SKIN CANCER, HX OF 06/08/2007   Past Medical History:  Diagnosis Date  . DDD (degenerative disc disease)    with epidural injection  . Gastritis    H pylori, partial tx.  EGD 04/2002  . Gout   . History of skin cancer   . Hyperlipidemia   . Hypertension    some white coat component- better at home  . Stroke ([place])   . TIA (transient ischemic  attack)    Past Surgical History:  Procedure Laterality Date  . COLONOSCOPY WITH PROPOFOL N/A 01/16/2018   Procedure: COLONOSCOPY WITH PROPOFOL;  Surgeon: Lollie Sails, MD;  Location: [facility] ENDOSCOPY;  Service: Endoscopy;  Laterality: N/A;  . [place]  . LOOP RECORDER INSERTION N/A 12/21/2016   Procedure: Loop Recorder Insertion;  Surgeon: Deboraha Sprang, MD;  Location: [facility] CV LAB;  Service: Cardiovascular;  Laterality: N/A;  . TEE WITHOUT CARDIOVERSION N/A 12/21/2016   Procedure: TRANSESOPHAGEAL ECHOCARDIOGRAM (TEE);  Surgeon: Nelva Bush, MD;  Location: [facility] ORS;  Service: Cardiovascular;  Laterality: N/A;   Social History   Tobacco Use  . Smoking status: Former Smoker    Packs/day: 1.00    Years: 8.00    Pack years: 8.00    Types: Cigarettes    Quit date: 1973    Years since quitting: 48.4  . Smokeless tobacco: Never Used  Substance Use Topics  . Alcohol use: Yes    Alcohol/week: 7.0 standard drinks    Types: 7 Glasses of wine per week  . Drug use: No   Family History  Problem Relation Age of Onset  . Other Mother        brain tumor  . Skin cancer Mother        suspect melanoma  . Heart attack Father 13  . Colon cancer Sister    No Known Allergies Current Outpatient Medications on File Prior to Visit  Medication Sig Dispense Refill  . allopurinol (ZYLOPRIM) 300 MG tablet Take 1 tablet (300 mg total) by mouth daily. At night 90 tablet 0  . Ascorbic Acid (VITAMIN C) 1000 MG tablet Take 1,000 mg by mouth daily.      Marland Kitchen aspirin EC 81 MG tablet Take 162 mg by mouth 2 (two) times daily.    Marland Kitchen atorvastatin (LIPITOR) 20 MG tablet TAKE 1 TABLET(20 MG( every 3 days 1 tablet 0  . bisoprolol (ZEBETA) 5 MG tablet Take 1 and 1/2 tablets by mouth (7.5 mg) once daily 135 tablet 0  . calcium carbonate (TUMS EX) 750 MG chewable tablet Chew 1 tablet by mouth every 2 (two) hours as needed for heartburn.    . Calcium-Magnesium-Zinc 1000-400-15 MG TABS Take 1  tablet by mouth daily.      Marland Kitchen lisinopril (ZESTRIL) 20 MG tablet TAKE 1 TABLET(20 MG) BY MOUTH DAILY 90 tablet 0  . Multiple Vitamin (MULTIVITAMIN) tablet Take 1 tablet by mouth daily.    . Omega-3 Fatty Acids (FISH OIL PO) Take 1 capsule by mouth daily.     . potassium chloride (K-DUR) 10 MEQ tablet Take 10 mEq by mouth daily.    Marland Kitchen VITAMIN E PO Take 1 capsule by mouth daily.      No current facility-administered medications on file prior to visit.  Review of Systems  Constitutional: Negative for activity change, appetite change, fatigue, fever and unexpected weight change.  HENT: Negative for congestion, rhinorrhea, sore throat and trouble swallowing.   Eyes: Negative for pain, redness, itching and visual disturbance.  Respiratory: Negative for cough, chest tightness, shortness of breath and wheezing.   Cardiovascular: Negative for chest pain and palpitations.  Gastrointestinal: Negative for abdominal pain, blood in stool, constipation, diarrhea and nausea.  Endocrine: Negative for cold intolerance, heat intolerance, polydipsia and polyuria.  Genitourinary: Negative for difficulty urinating, dysuria, frequency and urgency.  Musculoskeletal: Negative for arthralgias, joint swelling and myalgias.  Skin: Negative for pallor and rash.  Neurological: Negative for dizziness, tremors, weakness, numbness and headaches.  Hematological: Negative for adenopathy. Does not bruise/bleed easily.  Psychiatric/Behavioral: Negative for decreased concentration and dysphoric mood. The patient is not nervous/anxious.        Objective:   Physical Exam Constitutional:      General: He is not in acute distress.    Appearance: Normal appearance. He is well-developed and normal weight. He is not ill-appearing or diaphoretic.  HENT:     Head: Normocephalic and atraumatic.     Mouth/Throat:     Mouth: Mucous membranes are moist.  Eyes:     General: No scleral icterus.    Conjunctiva/sclera: Conjunctivae  normal.     Pupils: Pupils are equal, round, and reactive to light.  Neck:     Thyroid: No thyromegaly.     Vascular: No carotid bruit or JVD.  Cardiovascular:     Rate and Rhythm: Normal rate and regular rhythm.     Pulses: Normal pulses.     Heart sounds: Normal heart sounds. No gallop.   Pulmonary:     Effort: Pulmonary effort is normal. No respiratory distress.     Breath sounds: Normal breath sounds. No wheezing or rales.  Abdominal:     General: Bowel sounds are normal. There is no distension or abdominal bruit.     Palpations: Abdomen is soft. There is no mass.     Tenderness: There is no abdominal tenderness.  Musculoskeletal:     Cervical back: Normal range of motion and neck supple.     Right lower leg: No edema.     Left lower leg: No edema.  Lymphadenopathy:     Cervical: No cervical adenopathy.  Skin:    General: Skin is warm and dry.     Coloration: Skin is not pale.     Findings: No erythema or rash.     Comments: No bruising  Neurological:     Mental Status: He is alert.     Sensory: No sensory deficit.     Coordination: Coordination normal.     Deep Tendon Reflexes: Reflexes are normal and symmetric. Reflexes normal.  Psychiatric:        Mood and Affect: Mood normal.        Cognition and Memory: Cognition and memory normal.           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[place])  No clinical changes Sees cardiology       Relevant Medications   torsemide (DEMADEX) 20 MG tablet   CHF (congestive heart failure) ([place])    New with recent hospitalization in [place] for sob and  pneumonia on cxr  Per echo-most likely diastolic dysfunction Reviewed hospital records, lab results and studies in detail   Tolerating torsemide and symptom free  cxr and lab today  Will ref for cardiology f/u      Relevant Medications   torsemide (DEMADEX) 20 MG tablet   Other Relevant Orders   Basic metabolic panel (Completed)   DG Chest 2 View (Completed)   Ambulatory referral to Cardiology     Respiratory   Pneumonia - Primary    Diagnosed with sob (also chf) Recently hosp in [place] hospital records, lab results and studies in detail  Clinically resolved after abx (doxy and azithro then dc on cefdinir)  cxr today       Relevant Orders   DG Chest 2 View (Completed)     Other   Thrombocytopenia ([place])    Platelet ct was 100 when in the hospital (in setting of pneumonia and also chf) No abn bleeding or bruising  Continue to follow

## 2020-02-22 NOTE — Patient Instructions (Addendum)
Chest xray now to re check lungs  Labs for your electrolytes (due to the diuretic)  I will place a cardiology referral - the office will call you to set that up   If symptoms come back please call and let us know or go to the emergency room

## 2020-02-23 NOTE — Assessment & Plan Note (Signed)
Diagnosed with sob (also chf) Recently hosp in [place] hospital records, lab results and studies in detail  Clinically resolved after abx (doxy and azithro then dc on cefdinir)  cxr today

## 2020-02-23 NOTE — Assessment & Plan Note (Signed)
bp in fair control at this time  BP Readings from Last 1 Encounters:  02/22/20 138/80   No changes needed Most recent labs reviewed  Disc lifstyle change with low sodium diet and exercise

## 2020-02-23 NOTE — Assessment & Plan Note (Signed)
No clinical changes Sees cardiology 

## 2020-02-23 NOTE — Assessment & Plan Note (Signed)
Platelet ct was 100 when in the hospital (in setting of pneumonia and also chf) No abn bleeding or bruising  Continue to follow

## 2020-02-23 NOTE — Assessment & Plan Note (Signed)
New with recent hospitalization in [place] for sob and pneumonia on cxr  Per echo-most likely diastolic dysfunction Reviewed hospital records, lab results and studies in detail   Tolerating torsemide and symptom free  cxr and lab today  Will ref for cardiology f/u

## 2020-02-29 ENCOUNTER — Ambulatory Visit (INDEPENDENT_AMBULATORY_CARE_PROVIDER_SITE_OTHER): Payer: PPO | Admitting: Internal Medicine

## 2020-02-29 ENCOUNTER — Encounter: Payer: Self-pay | Admitting: Internal Medicine

## 2020-02-29 ENCOUNTER — Other Ambulatory Visit: Payer: Self-pay

## 2020-02-29 VITALS — BP 138/86 | HR 57 | Ht 73.0 in | Wt 175.4 lb

## 2020-02-29 DIAGNOSIS — R0602 Shortness of breath: Secondary | ICD-10-CM

## 2020-02-29 DIAGNOSIS — I10 Essential (primary) hypertension: Secondary | ICD-10-CM | POA: Diagnosis not present

## 2020-02-29 DIAGNOSIS — I639 Cerebral infarction, unspecified: Secondary | ICD-10-CM

## 2020-02-29 DIAGNOSIS — I5030 Unspecified diastolic (congestive) heart failure: Secondary | ICD-10-CM | POA: Diagnosis not present

## 2020-02-29 MED ORDER — TORSEMIDE 20 MG PO TABS: 20.0000 mg | ORAL_TABLET | Freq: Every day | ORAL | 1 refills | Status: DC | PRN

## 2020-02-29 NOTE — Patient Instructions (Signed)
Medication Instructions:  Your physician has recommended you make the following change in your medication:  1- CHANGE Torsemide and potassium to daily as needed for weight gain >2 lbs in 24 hr, >5 lb in 1 week, and leg swelling.   *If you need a refill on your cardiac medications before your next appointment, please call your pharmacy*   Lab Work: none If you have labs (blood work) drawn today and your tests are completely normal, you will receive your results only by: Marland Kitchen MyChart Message (if you have MyChart) OR . A paper copy in the mail If you have any lab test that is abnormal or we need to change your treatment, we will call you to review the results.   Testing/Procedures: Your physician has requested that you have an exercise tolerance test. For further information please visit [email or web address]. Please also follow instruction sheet, as given.   DO NOT drink or eat foods with caffeine for 24 hours before the test. (Chocolate, coffee, tea, decaf coffee/tea, or energy drinks)  DO NOT smoke for 4 hours before your test.  If you use an inhaler, bring it with you to the test.  Wear comfortable shoes and clothing. Women do not wear dresses.    Follow-Up: At [facility] you and your health needs are our priority.  As part of our continuing mission to provide you with exceptional heart care, we have created designated Provider Care Teams.  These Care Teams include your primary Cardiologist (physician) and Advanced Practice Providers (APPs -  Physician Assistants and Nurse Practitioners) who all work together to provide you with the care you need, when you need it.  We recommend signing up for the patient portal called "MyChart".  Sign up information is provided on this After Visit Summary.  MyChart is used to connect with patients for Virtual Visits (Telemedicine).  Patients are able to view lab/test results, encounter notes, upcoming appointments, etc.  Non-urgent messages can be  sent to your provider as well.   To learn more about what you can do with MyChart, go to [email or web address].    Your next appointment:   3 month(s)  The format for your next appointment:   In Person  Provider:    You may see DR Harrell Gave END or one of the following Advanced Practice Providers on your designated Care Team:    Murray Hodgkins, NP  Christell Faith, PA-C  Marrianne Mood, PA-C

## 2020-02-29 NOTE — Progress Notes (Signed)
Follow-up Outpatient Visit Date: 02/29/2020  Primary Care Provider: Abner Greenspan, MD [place] [address]  Chief Complaint: Shortness of breath  HPI:  [name] is a 79 y.o. male with history of hyperlipidemia and stroke, who presents for evaluation of shortness of breath.  I last saw him in 02/2017.  Mr. Mavros was hospitalized at [facility] last month with shortness of breath.  He was diagnosed with pneumonia and heart failure.  Echo report during that time indicates LVEF of 55-60% with grade 2 diastolic dysfunction and mild LVH.  Two nights before his hospitalization, Mr. Butta reports that he did feel sleep well though he otherwise felt like himself.  The next day, he was fatigued but without other symptoms including shortness of breath and chest pain.  However, that night, he became significantly short of breath with orthopnea and ultimately called EMS.  Mr. Eckstrom denies any preceding symptoms like fatigue or exertional dyspnea.  He did not notice any edema or weight gain.  He was started on torsemide 20 mg daily in the hospital, which he continues on at this time.  He feels like he is back to his baseline but is concerned about the potential for a repeat episode of acute shortness of breath.  Mr. Fuentez has not had any new neurologic changes consistent with recurrent stroke.  He continues to be monitored via implantable loop recorder placement 2018.  --------------------------------------------------------------------------------------------------  Cardiovascular History & Procedures: Cardiovascular Problems:  Cryptogenic stroke  Mildly dilated ascending aorta  Risk Factors:  Hypertension, hyperlipidemia, male gender, and age greater than 21  Cath/PCI:  None  CV Surgery:  None  EP Procedures and Devices:  Implantable loop recorder (12/21/16)  24-hour Holter monitor (10/20/16, [place]): Predominant rhythm was sinus with an  average rate of 63 bpm (range 46-93 bpm). Rare supraventricular and ventricular ectopy was identified. There was one episode of nonsustained ventricular tachycardia lasting 4 beats. No sustained arrhythmias, including atrial fibrillation/flutter) are evident.  Non-Invasive Evaluation(s):  TTE (02/05/2020, [facility]): Normal LV size with mild LVH.  LVEF 55-60% with grade 2 diastolic dysfunction and elevated filling pressures.  Normal RV size and function.  Moderate left and mild right atrial enlargement.  Mild to moderate mitral regurgitation.  Mild tricuspid regurgitation.  Aortic sclerosis.  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   TEE (12/21/16): Normal LV size with mild LVH. LVEF 55-65%. Mild aortic regurgitation. Mildly dilated ascending aorta. Mild mitral regurgitation. No left or right atrial thrombus. No PFO or ASD; negative  TTE (12/02/16): Normal LV size with mild LVH. LVEF 55-60% with normal wall motion. Normal diastolic function. Moderate AI. Mildly dilated ascending aorta measuring 3.9 cm. Mild MR. Normal RV size and function.  Transthoracic echocardiogram (10/2016; [place]): Reportedly normal, though no images or reports is available for review.  Recent CV Pertinent Labs: Lab Results  Component Value Date   CHOL 124 04/23/2019   HDL 42.60 04/23/2019   LDLCALC 25 01/10/2017   LDLDIRECT 42.0 04/23/2019   TRIG 342.0 (H) 04/23/2019   CHOLHDL 3 04/23/2019   INR 1.1 12/20/2018   K 4.0 02/22/2020   K 3.9 05/15/2009   BUN 21 02/22/2020   BUN 12 05/15/2009   CREATININE 0.78 02/22/2020   CREATININE 0.8 05/15/2009    Past medical and surgical history were reviewed and updated in EPIC.  Current Meds  Medication Sig  . allopurinol (ZYLOPRIM) 300 MG tablet Take 1 tablet (300  mg total) by mouth daily. At night  . Ascorbic Acid (VITAMIN C) 1000 MG tablet Take 1,000 mg by mouth daily.    Marland Kitchen aspirin EC 81 MG tablet Take 162 mg by mouth 2 (two)  times daily.  Marland Kitchen atorvastatin (LIPITOR) 20 MG tablet TAKE 1 TABLET(20 MG( every 3 days  . bisoprolol (ZEBETA) 5 MG tablet Take 1 and 1/2 tablets by mouth (7.5 mg) once daily  . calcium carbonate (TUMS EX) 750 MG chewable tablet Chew 1 tablet by mouth every 2 (two) hours as needed for heartburn.  . Calcium-Magnesium-Zinc 1000-400-15 MG TABS Take 1 tablet by mouth daily.    Marland Kitchen lisinopril (ZESTRIL) 20 MG tablet TAKE 1 TABLET(20 MG) BY MOUTH DAILY  . Multiple Vitamin (MULTIVITAMIN) tablet Take 1 tablet by mouth daily.  . Omega-3 Fatty Acids (FISH OIL PO) Take 1 capsule by mouth daily.   . potassium chloride (K-DUR) 10 MEQ tablet Take 10 mEq by mouth daily.  Marland Kitchen torsemide (DEMADEX) 20 MG tablet Take 1 tablet (20 mg total) by mouth daily.  Marland Kitchen VITAMIN E PO Take 1 capsule by mouth daily.     Allergies: Patient has no known allergies.  Social History   Tobacco Use  . Smoking status: Former Smoker    Packs/day: 1.00    Years: 8.00    Pack years: 8.00    Types: Cigarettes    Quit date: 1973    Years since quitting: 48.4  . Smokeless tobacco: Never Used  Substance Use Topics  . Alcohol use: Yes    Alcohol/week: 7.0 standard drinks    Types: 7 Glasses of wine per week    Comment: weekly-with dinner  . Drug use: No    Family History  Problem Relation Age of Onset  . Other Mother        brain tumor  . Skin cancer Mother        suspect melanoma  . Heart attack Father 33  . Colon cancer Sister     Review of Systems: A 12-system review of systems was performed and was negative except as noted in the HPI.  --------------------------------------------------------------------------------------------------  Physical Exam: BP 138/86 (BP Location: Left Arm, Patient Position: Sitting, Cuff Size: Normal)   Pulse (!) 57   Ht 6\' 1"  (1.854 m)   Wt 175 lb 6 oz (79.5 kg)   SpO2 97%   BMI 23.14 kg/m   General: NAD. Neck: No JVD or HJR. Lungs: Clear to auscultation without wheezes or  crackles. Heart: Bradycardic but regular without murmurs rubs or gallops. Abdomen: Soft, nontender, nondistended. Extremities: No lower extremity edema.  2+ radial and pedal pulses.  EKG: Sinus bradycardia (heart rate 57 bpm).  Otherwise, no significant abnormality.  Lab Results  Component Value Date   WBC 6.3 04/23/2019   HGB 15.2 04/23/2019   HCT 44.3 04/23/2019   MCV 97.8 04/23/2019   PLT 114.0 (L) 04/23/2019    Lab Results  Component Value Date   NA 140 02/22/2020   K 4.0 02/22/2020   CL 103 02/22/2020   CO2 32 02/22/2020   BUN 21 02/22/2020   CREATININE 0.78 02/22/2020   GLUCOSE 125 (H) 02/22/2020   ALT 39 04/23/2019    Lab Results  Component Value Date   CHOL 124 04/23/2019   HDL 42.60 04/23/2019   LDLCALC 25 01/10/2017   LDLDIRECT 42.0 04/23/2019   TRIG 342.0 (H) 04/23/2019   CHOLHDL 3 04/23/2019    --------------------------------------------------------------------------------------------------  ASSESSMENT AND PLAN: HFpEF and shortness  of breath: Recent hospitalization is most consistent with an episode of acute HFpEF.  Mr. Schorr did not have any symptoms consistent with an acute infection such as fevers, chills, nor was his white blood cell count elevated.  He may have developed flash pulmonary edema in the setting of diastolic dysfunction.  He appears euvolemic on exam today and is back to his baseline.  I worry about the potential for becoming dehydrated.  I advised Mr. Langille to take torsemide 20 mg daily and potassium chloride 10 mEq daily if he gains more than  2 pounds in 24 hours or 5 pounds in a week.  Though he is not had any other ischemic symptoms, Mr. Cordial has risk factors for ischemic heart disease.  We have agreed to perform an exercise tolerance test for further evaluation.  Cryptogenic stroke: No new neurologic symptoms reported.  Implantable loop recorder thus far has not demonstrated evidence of atrial fibrillation/flutter.  TEE at the time of  diagnosis was negative for intracardiac thrombus or right to left shunt.  Recent echo at outside hospital suggests a PFO, though images are not available for review.  I recommend continuation of aspirin and atorvastatin for secondary prevention.  Hypertension: Blood pressure borderline elevated today.  We will defer changes to current regimen of bisoprolol and lisinopril.  Follow-up: Return to clinic in 3 months.  Nelva Bush, MD 02/29/2020 9:37 AM

## 2020-03-01 ENCOUNTER — Encounter: Payer: Self-pay | Admitting: Internal Medicine

## 2020-03-01 DIAGNOSIS — R0602 Shortness of breath: Secondary | ICD-10-CM | POA: Insufficient documentation

## 2020-03-01 DIAGNOSIS — I5030 Unspecified diastolic (congestive) heart failure: Secondary | ICD-10-CM | POA: Insufficient documentation

## 2020-03-21 ENCOUNTER — Telehealth: Payer: Self-pay

## 2020-03-21 ENCOUNTER — Ambulatory Visit: Payer: PPO

## 2020-03-21 ENCOUNTER — Other Ambulatory Visit: Payer: Self-pay

## 2020-03-21 NOTE — Telephone Encounter (Signed)
Pt arrived in office for gxt.   Pt identified with 2 indicators and consented. Explained procedure and pt placed on cardiac monitor. BL vital signs obtained while in standing position.  0905 HR 67, BP 188/110 0910 HR 61, BP 173/102 0915 HR 61, BP 165/102  Pt reports he held medications this morning as follows; bisoprolol, lisinopril and torsemide.   Spoke with Christell Faith, PA who indicated that test should be rescheduled d/t BL HTN.   Explained to patient parameter needed for GXT. He verbalized understanding and agreeable to reschedule. Pt verbalized that at next appt he will need to hold only bisoprolol and torsemide. He should take lisinopril.   Appt rescheduled for 03/27/20.  Routing to to primary MD and RN as Juluis Rainier.

## 2020-03-22 ENCOUNTER — Other Ambulatory Visit: Payer: Self-pay | Admitting: Family Medicine

## 2020-03-24 ENCOUNTER — Ambulatory Visit (INDEPENDENT_AMBULATORY_CARE_PROVIDER_SITE_OTHER): Payer: PPO | Admitting: *Deleted

## 2020-03-24 DIAGNOSIS — I639 Cerebral infarction, unspecified: Secondary | ICD-10-CM

## 2020-03-24 LAB — CUP PACEART REMOTE DEVICE CHECK
Date Time Interrogation Session: 20210613235247
Implantable Pulse Generator Implant Date: 20180313

## 2020-03-24 MED ORDER — LISINOPRIL 40 MG PO TABS: 40.0000 mg | ORAL_TABLET | Freq: Every day | ORAL | 3 refills | Status: DC

## 2020-03-25 NOTE — Progress Notes (Signed)
Carelink Summary Report / Loop Recorder 

## 2020-03-27 ENCOUNTER — Ambulatory Visit (INDEPENDENT_AMBULATORY_CARE_PROVIDER_SITE_OTHER): Payer: PPO

## 2020-03-27 ENCOUNTER — Other Ambulatory Visit: Payer: Self-pay

## 2020-03-27 DIAGNOSIS — I639 Cerebral infarction, unspecified: Secondary | ICD-10-CM | POA: Diagnosis not present

## 2020-03-31 LAB — EXERCISE TOLERANCE TEST
Estimated workload: 11.5 METS
Exercise duration (min): 9 min
Exercise duration (sec): 52 s
MPHR: 144 {beats}/min
Peak HR: 127 {beats}/min
Percent HR: 88 %
RPE: 16
Rest HR: 71 {beats}/min

## 2020-04-07 NOTE — Chronic Care Management (AMB) (Deleted)
Chronic Care Management Pharmacy  Name: Jeffrey [name]  MRN: [id number] DOB: 23-Jan-1941  Chief Complaint/ HPI  Jeffrey [name],  79 y.o., male presents for their Initial CCM visit with the clinical pharmacist via telephone.  PCP : Abner Greenspan, MD  Their chronic conditions include: HTN, HLD, gout, HFpEF, cryptogenic stroke, pre-diabetes  *** 5/12 consent  Office Visits:  02/22/20 PCP visit - Per ECHO, diastolic dysfunction. Referred to cardiology. Started on torsemide 20 mg 1 table daily as needed for swelling.  Consult Visit:  03/22/20: Cardiology patient message - increase lisinopril to 40 mg daily due to home BP 155/90s  02/29/20: Cardiology - CHF, concerned about dehydration, take torsemide 20 mg daily with potassium 10 mEq only if weight gain of > 2 lbs in 24 hours or 5 lbs in 1 week; Stroke - continue aspirin and atorvastatin   02/05/20 ED admission - [facility], [place], [place] for SOB. Pneumonia and acute systolic heart failure. Given rocephin and azithromycin for pneumonia. Given lasix and ECHO ordered. Started on torsemide 20 mg 1 tablet daily. Estimated EF 55-60%  Medications: Outpatient Encounter Medications as of 04/08/2020  Medication Sig   allopurinol (ZYLOPRIM) 300 MG tablet Take 1 tablet (300 mg total) by mouth daily. At night   Ascorbic Acid (VITAMIN C) 1000 MG tablet Take 1,000 mg by mouth daily.     aspirin EC 81 MG tablet Take 162 mg by mouth 2 (two) times daily.   atorvastatin (LIPITOR) 20 MG tablet TAKE 1 TABLET(20 MG( every 3 days   bisoprolol (ZEBETA) 5 MG tablet Take 1 and 1/2 tablets by mouth (7.5 mg) once daily   calcium carbonate (TUMS EX) 750 MG chewable tablet Chew 1 tablet by mouth every 2 (two) hours as needed for heartburn.   Calcium-Magnesium-Zinc 1000-400-15 MG TABS Take 1 tablet by mouth daily.     lisinopril (ZESTRIL) 40 MG tablet Take 1 tablet (40 mg total) by mouth daily.   Multiple Vitamin (MULTIVITAMIN) tablet Take 1 tablet by  mouth daily.   Omega-3 Fatty Acids (FISH OIL PO) Take 1 capsule by mouth daily.    potassium chloride (K-DUR) 10 MEQ tablet Take 10 mEq by mouth daily as needed. for weight gain >2 lbs in 24 hr, >5 lb in 1 week, and leg swelling.   torsemide (DEMADEX) 20 MG tablet Take 1 tablet (20 mg total) by mouth daily as needed (for weight gain >2 lbs in 24 hr, >5 lb in 1 week, and leg swelling.).   VITAMIN E PO Take 1 capsule by mouth daily.    No facility-administered encounter medications on file as of 04/08/2020.     Current Diagnosis/Assessment:  Goals Addressed   None     Heart Failure   Type: Diastolic  Last ejection fraction: 55-60% (02/05/20) NYHA Class: {CHL HP Upstream Pharm NYHA Class:[phone]} AHA HF Stage: {CHL HP Upstream Pharm AHA HF Stage:[phone]}  Patient has failed these meds in past: None Patient is currently {CHL Controlled/Uncontrolled:[id number]} on the following medications:   Torsemide 20 mg 1 tablet daily as needed for weight gain > 2 lbs, > 5 in a week, and leg swelling  Potassium chloride 10 meq 1 tablet daily as needed with torsemide  We discussed {CHL HP Upstream Pharmacy discussion:[phone]}  Plan  Continue {CHL HP Upstream Pharmacy Plans:[phone]}   Hypertension   BP today is:  {CHL HP UPSTREAM Pharmacist BP ranges:[phone]}  Office blood pressures are  BP Readings from Last 3 Encounters:  02/29/20  138/86  02/22/20 138/80  02/12/19 122/80    Patient has failed these meds in the past: HCTZ Patient is currently {CHL Controlled/[phone]} on the following medications:   Bisoprolol 5 mg 1.5 tablet daily  Lisinopril 40 mg 1 tablet daily (increased from 20 mg daily on 03/22/20)  Patient checks BP at home {CHL HP BP Monitoring Frequency:[phone]}  Patient home BP readings are ranging: (2 hours after medication)  6/18 - 143 over 77 - pulse 59  6/19 - 124 over 64 - pulse 64  6/20 - 141 over 75 - pulse 62  6/21 -  145 over 83 - pulse 61  We discussed {CHL HP Upstream Pharmacy discussion:[id number]}  Plan  Continue {CHL HP Upstream Pharmacy Plans:[phone]}    Hyperlipidemia   Lipid Panel     Component Value Date/Time   CHOL 124 04/23/2019 1128   TRIG 342.0 (H) 04/23/2019 1128   HDL 42.60 04/23/2019 1128   CHOLHDL 3 04/23/2019 1128   VLDL 68.4 (H) 04/23/2019 1128   LDLCALC 25 01/10/2017 0801   LDLDIRECT 42.0 04/23/2019 1128     The ASCVD Risk score (Goff DC Jr., et al., 2013) failed to calculate for the following reasons:   The patient has a prior MI or stroke diagnosis   Patient has failed these meds in past: none Patient is currently {CHL Controlled/[phone]} on the following medications:   Atorvastatin 20 mg 1 tablet every 3 days  We discussed:  {CHL HP Upstream Pharmacy discussion:[id number]}  Plan  Continue {CHL HP Upstream Pharmacy Plans:[phone]}   Gout   Patient has failed these meds in past: none Patient is currently {CHL Controlled/[phone]} on the following medications:   Allopurinol 300 mg 1 tablet daily at night  We discussed:  ***  Plan  Continue {CHL HP Upstream Pharmacy Plans:[phone]}   Cryptogenic Stroke   Patient has failed these meds in past: None Patient is currently {CHL Controlled/[phone]} on the following medications:   Aspirin 81 mg 2 tablets twice daily  We discussed:  ***  Plan  Continue {CHL HP Upstream Pharmacy Plans:[phone]}    OTCs/Health Maintenance   Patient is currently {CHL Controlled/[phone]} on the following medications:  Vitamin C 1000 mg 1 tablet daily  Tums EX 750 mg 1 tablet every 2 hrs as needed for heartburn  Multivitamin 1 tablet daily  Vitamin E *** 1 capsule daily  We discussed:  ***  Plan  Continue {CHL HP Upstream Pharmacy TUUEK:[id number]}    Vaccines   Reviewed and discussed patient's vaccination history.    Immunization  History  Administered Date(s) Administered   Hepatitis A, Adult 07/21/2016   Influenza Split 08/25/2011   Influenza,inj,Quad PF,6+ Mos 08/01/2013, 11/05/2014, 07/21/2016   PFIZER SARS-COV-2 Vaccination 11/16/2019, 12/11/2019   Pneumococcal Conjugate-13 11/05/2014   Pneumococcal Polysaccharide-23 08/25/2011   Td 04/01/2003, 08/01/2013    Plan  Recommended patient receive *** vaccine in *** office.    Medication Management   Pharmacy/Benefits: [facility] / [facility] Adherence:  Pt endorses ***% compliance  We discussed: ***  Plan  {US Pharmacy TAVW:97948}   Follow up: *** month phone visit  Timer :  CPP Chart prep 14:28:32 14:56:03 0:27:31

## 2020-04-07 NOTE — Chronic Care Management (AMB) (Deleted)
Chronic Care Management Pharmacy  Name: Jeffrey [name]  MRN: [id number] DOB: 23-May-1941  Chief Complaint/ HPI  Jeffrey [name],  79 y.o. , male presents for their {Initial/Follow-up:3041532} CCM visit with the clinical pharmacist {CHL HP Upstream Pharm visit XTKW:[id number]}.  PCP : Jeffrey Greenspan, MD  Their chronic conditions include: HTN, HLD, gout, CHF, cryptogenic stroke  *** 5/12 consent  Office Visits: 02/22/20 OV - Per ECHO, diastolic dysfunction. Referred to cardiology. Started on torsemide 20 mg 1 table daily as needed for swelling.  Consult Visit: 02/05/20 OV - Admitted at [facility], [place], [place] for SOB. Pneumonia and acute systolic heart failure. Given rocephin and azithromycin for pneumonia. Given lasix and ECHO ordered. Started on torsemide 20 mg 1 tablet daily. Estimated EF 55-60%  Medications: Outpatient Encounter Medications as of 04/08/2020  Medication Sig  . allopurinol (ZYLOPRIM) 300 MG tablet Take 1 tablet (300 mg total) by mouth daily. At night  . Ascorbic Acid (VITAMIN C) 1000 MG tablet Take 1,000 mg by mouth daily.    Marland Kitchen aspirin EC 81 MG tablet Take 162 mg by mouth 2 (two) times daily.  Marland Kitchen atorvastatin (LIPITOR) 20 MG tablet TAKE 1 TABLET(20 MG( every 3 days  . bisoprolol (ZEBETA) 5 MG tablet Take 1 and 1/2 tablets by mouth (7.5 mg) once daily  . calcium carbonate (TUMS EX) 750 MG chewable tablet Chew 1 tablet by mouth every 2 (two) hours as needed for heartburn.  . Calcium-Magnesium-Zinc 1000-400-15 MG TABS Take 1 tablet by mouth daily.    Marland Kitchen lisinopril (ZESTRIL) 40 MG tablet Take 1 tablet (40 mg total) by mouth daily.  . Multiple Vitamin (MULTIVITAMIN) tablet Take 1 tablet by mouth daily.  . Omega-3 Fatty Acids (FISH OIL PO) Take 1 capsule by mouth daily.   . potassium chloride (K-DUR) 10 MEQ tablet Take 10 mEq by mouth daily as needed. for weight gain >2 lbs in 24 hr, >5 lb in 1 week, and leg swelling.  . torsemide (DEMADEX) 20 MG tablet Take 1 tablet  (20 mg total) by mouth daily as needed (for weight gain >2 lbs in 24 hr, >5 lb in 1 week, and leg swelling.).  Marland Kitchen VITAMIN E PO Take 1 capsule by mouth daily.    No facility-administered encounter medications on file as of 04/08/2020.     Current Diagnosis/Assessment:  Goals Addressed   None     Heart Failure   Type: Diastolic  Last ejection fraction: 55-60% (02/05/20) NYHA Class: {CHL HP Upstream Pharm NYHA Class:[id number]} AHA HF Stage: {CHL HP Upstream Pharm AHA HF Stage:[phone]}  Patient has failed these meds in past: None Patient is currently {CHL Controlled/Uncontrolled:[phone]} on the following medications:  . Torsemide 20 mg 1 tablet daily as needed for weight gain > 2 lbs, > 5 in a week, and leg swelling . Potassium chloride 10 meq 1 tablet daily as needed with torsemide  We discussed {CHL HP Upstream Pharmacy [phone]}  Plan  Continue {CHL HP Upstream Pharmacy [phone]}   Hypertension   BP today is:  {CHL HP UPSTREAM Pharmacist BP [phone]}  Office blood pressures are  BP Readings from Last 3 Encounters:  02/29/20 138/86  02/22/20 138/80  02/12/19 122/80    Patient has failed these meds in the past: HCTZ Patient is currently {CHL Controlled/Uncontrolled:[phone]} on the following medications:  . Bisoprolol 5 mg 1.5 tablet daily . Lisinopril 40 mg 1 tablet daily  Patient checks BP at home {CHL HP BP Monitoring Frequency:[phone]}  Patient  home BP readings are ranging: ***  We discussed {CHL HP Upstream Pharmacy [phone]}  Plan  Continue {CHL HP Upstream Pharmacy [phone]}    Hyperlipidemia   Lipid Panel     Component Value Date/Time   CHOL 124 04/23/2019 1128   TRIG 342.0 (H) 04/23/2019 1128   HDL 42.60 04/23/2019 1128   CHOLHDL 3 04/23/2019 1128   VLDL 68.4 (H) 04/23/2019 1128   LDLCALC 25 01/10/2017 0801   LDLDIRECT 42.0 04/23/2019 1128     The ASCVD Risk score (Goff DC Jr.,  et al., 2013) failed to calculate for the following reasons:   The patient has a prior MI or stroke diagnosis   Patient has failed these meds in past: none Patient is currently {CHL Controlled/Uncontrolled:[phone]} on the following medications:  . Atorvastatin 20 mg 1 tablet every 3 days  We discussed:  {CHL HP Upstream Pharmacy [phone]}  Plan  Continue {CHL HP Upstream Pharmacy [phone]}   Gout   Patient has failed these meds in past: none Patient is currently {CHL Controlled/Uncontrolled:[phone]} on the following medications:  . Allopurinol 300 mg 1 tablet daily at night  We discussed:  ***  Plan  Continue {CHL HP Upstream Pharmacy SKAJG:[id number]}   Cryptogenic Stroke   Patient has failed these meds in past: None Patient is currently {CHL Controlled/Uncontrolled:[phone]} on the following medications:  . Aspirin 81 mg 2 tablets twice daily  We discussed:  ***  Plan  Continue {CHL HP Upstream Pharmacy TDHRC:[id number]}    OTCs/Health Maintenance   Patient is currently {CHL Controlled/Uncontrolled:[phone]} on the following medications: Marland Kitchen Vitamin C 1000 mg 1 tablet daily . Tums EX 750 mg 1 tablet every 2 hrs as needed for heartburn . Multivitamin 1 tablet daily . Vitamin E *** 1 capsule daily  We discussed:  ***  Plan  Continue {CHL HP Upstream Pharmacy OEHOZ:[id number]}    Vaccines   Reviewed and discussed patient's vaccination history.    Immunization History  Administered Date(s) Administered  . Hepatitis A, Adult 07/21/2016  . Influenza Split 08/25/2011  . Influenza,inj,Quad PF,6+ Mos 08/01/2013, 11/05/2014, 07/21/2016  . PFIZER SARS-COV-2 Vaccination 11/16/2019, 12/11/2019  . Pneumococcal Conjugate-13 11/05/2014  . Pneumococcal Polysaccharide-23 08/25/2011  . Td 04/01/2003, 08/01/2013    Plan  Recommended patient receive *** vaccine in *** office.    Medication Management   Pharmacy/Benefits: [facility]  / [facility] Adherence:  Pt endorses ***% compliance  We discussed: ***  Plan  {US Pharmacy CWUG:89169}   Follow up: *** month phone visit  Timer :  CPP Chart prep 14:28:32 14:56:03 0:27:31

## 2020-04-08 ENCOUNTER — Telehealth: Payer: PPO

## 2020-04-08 ENCOUNTER — Telehealth: Payer: Self-pay

## 2020-04-08 NOTE — Telephone Encounter (Signed)
Attempted to contact patient by telephone for initial CCM appointment at 9:00 AM on 04/08/20. Left voicemail with contact information for rescheduling.  Debbora Dus, PharmD Clinical Pharmacist [facility] at [facility] [phone]

## 2020-04-28 ENCOUNTER — Ambulatory Visit (INDEPENDENT_AMBULATORY_CARE_PROVIDER_SITE_OTHER): Payer: PPO | Admitting: *Deleted

## 2020-04-28 DIAGNOSIS — I639 Cerebral infarction, unspecified: Secondary | ICD-10-CM

## 2020-04-28 LAB — CUP PACEART REMOTE DEVICE CHECK
Date Time Interrogation Session: 20210718230605
Implantable Pulse Generator Implant Date: 20180313

## 2020-04-30 NOTE — Progress Notes (Signed)
Carelink Summary Report / Loop Recorder 

## 2020-05-07 ENCOUNTER — Telehealth: Payer: Self-pay

## 2020-05-07 NOTE — Chronic Care Management (AMB) (Signed)
Third unsuccessful attempt to reschedule CCM services today. Will cancel referral until further contact from patient.  Debbora Dus, PharmD Clinical Pharmacist [facility] at [facility] [phone]

## 2020-05-15 ENCOUNTER — Other Ambulatory Visit: Payer: Self-pay | Admitting: Family Medicine

## 2020-06-01 LAB — CUP PACEART REMOTE DEVICE CHECK
Date Time Interrogation Session: 20210818232734
Implantable Pulse Generator Implant Date: 20180313

## 2020-06-02 ENCOUNTER — Ambulatory Visit (INDEPENDENT_AMBULATORY_CARE_PROVIDER_SITE_OTHER): Payer: PPO | Admitting: *Deleted

## 2020-06-02 DIAGNOSIS — I639 Cerebral infarction, unspecified: Secondary | ICD-10-CM | POA: Diagnosis not present

## 2020-06-02 NOTE — Progress Notes (Signed)
Carelink Summary Report / Loop Recorder 

## 2020-06-04 ENCOUNTER — Other Ambulatory Visit: Payer: Self-pay | Admitting: Family Medicine

## 2020-06-04 ENCOUNTER — Ambulatory Visit: Payer: PPO | Admitting: Internal Medicine

## 2020-06-10 ENCOUNTER — Ambulatory Visit: Payer: PPO | Admitting: Family

## 2020-07-07 ENCOUNTER — Ambulatory Visit (INDEPENDENT_AMBULATORY_CARE_PROVIDER_SITE_OTHER): Payer: PPO

## 2020-07-07 DIAGNOSIS — I639 Cerebral infarction, unspecified: Secondary | ICD-10-CM | POA: Diagnosis not present

## 2020-07-07 LAB — CUP PACEART REMOTE DEVICE CHECK
Date Time Interrogation Session: 20210918232234
Implantable Pulse Generator Implant Date: 20180313

## 2020-07-09 NOTE — Progress Notes (Signed)
Carelink Summary Report / Loop Recorder 

## 2020-07-16 ENCOUNTER — Ambulatory Visit: Payer: PPO | Admitting: Internal Medicine

## 2020-07-19 ENCOUNTER — Other Ambulatory Visit: Payer: Self-pay | Admitting: Family Medicine

## 2020-07-21 NOTE — Telephone Encounter (Signed)
Pt had a f/u on 02/22/20 but hasn't had lipid labs done in over a year and no future appts., please advise

## 2020-07-21 NOTE — Telephone Encounter (Signed)
Please schedule PE and refill until then  

## 2020-07-22 ENCOUNTER — Other Ambulatory Visit: Payer: Self-pay | Admitting: Family Medicine

## 2020-07-22 NOTE — Telephone Encounter (Signed)
Rx refilled for 30 days, 0 refills. Pt routed to the front for PE appointment

## 2020-08-16 ENCOUNTER — Other Ambulatory Visit: Payer: Self-pay | Admitting: Family Medicine

## 2020-08-17 ENCOUNTER — Telehealth: Payer: Self-pay | Admitting: Family Medicine

## 2020-08-17 DIAGNOSIS — M109 Gout, unspecified: Secondary | ICD-10-CM

## 2020-08-17 DIAGNOSIS — D696 Thrombocytopenia, unspecified: Secondary | ICD-10-CM

## 2020-08-17 DIAGNOSIS — E7800 Pure hypercholesterolemia, unspecified: Secondary | ICD-10-CM

## 2020-08-17 DIAGNOSIS — I10 Essential (primary) hypertension: Secondary | ICD-10-CM

## 2020-08-17 DIAGNOSIS — Z125 Encounter for screening for malignant neoplasm of prostate: Secondary | ICD-10-CM

## 2020-08-17 DIAGNOSIS — R7303 Prediabetes: Secondary | ICD-10-CM

## 2020-08-17 NOTE — Telephone Encounter (Signed)
-----   Message from Cloyd Stagers, RT sent at 08/05/2020  1:25 PM EDT ----- Regarding: Lab Orders for Monday 11.8.2021 Please place lab orders for Monday 11.8.2021, office visit for physical on Thursday 11.11.2021 Thank you, Dyke Maes RT(R)

## 2020-08-18 ENCOUNTER — Other Ambulatory Visit: Payer: Self-pay

## 2020-08-18 ENCOUNTER — Other Ambulatory Visit (INDEPENDENT_AMBULATORY_CARE_PROVIDER_SITE_OTHER): Payer: PPO

## 2020-08-18 DIAGNOSIS — R7303 Prediabetes: Secondary | ICD-10-CM

## 2020-08-18 DIAGNOSIS — D696 Thrombocytopenia, unspecified: Secondary | ICD-10-CM

## 2020-08-18 DIAGNOSIS — E7800 Pure hypercholesterolemia, unspecified: Secondary | ICD-10-CM

## 2020-08-18 DIAGNOSIS — I10 Essential (primary) hypertension: Secondary | ICD-10-CM | POA: Diagnosis not present

## 2020-08-18 DIAGNOSIS — M109 Gout, unspecified: Secondary | ICD-10-CM

## 2020-08-18 DIAGNOSIS — Z125 Encounter for screening for malignant neoplasm of prostate: Secondary | ICD-10-CM | POA: Diagnosis not present

## 2020-08-18 LAB — HEMOGLOBIN A1C: Hgb A1c MFr Bld: 6.1 % (ref 4.6–6.5)

## 2020-08-18 LAB — LIPID PANEL
Cholesterol: 128 mg/dL (ref 0–200)
HDL: 41.4 mg/dL (ref >39.00)
NonHDL: 86.8
Total CHOL/HDL Ratio: 3
Triglycerides: 262 mg/dL — ABNORMAL HIGH (ref 0.0–149.0)
VLDL: 52.4 mg/dL — ABNORMAL HIGH (ref 0.0–40.0)

## 2020-08-18 LAB — COMPREHENSIVE METABOLIC PANEL
ALT: 52 U/L (ref 0–53)
AST: 36 U/L (ref 0–37)
Albumin: 4.1 g/dL (ref 3.5–5.2)
Alkaline Phosphatase: 51 U/L (ref 39–117)
BUN: 16 mg/dL (ref 6–23)
CO2: 31 mEq/L (ref 19–32)
Calcium: 9.1 mg/dL (ref 8.4–10.5)
Chloride: 103 mEq/L (ref 96–112)
Creatinine, Ser: 0.77 mg/dL (ref 0.40–1.50)
GFR: 86.47 mL/min (ref >60.00)
Glucose, Bld: 121 mg/dL — ABNORMAL HIGH (ref 70–99)
Potassium: 4.2 mEq/L (ref 3.5–5.1)
Sodium: 141 mEq/L (ref 135–145)
Total Bilirubin: 0.6 mg/dL (ref 0.2–1.2)
Total Protein: 5.9 g/dL — ABNORMAL LOW (ref 6.0–8.3)

## 2020-08-18 LAB — CBC WITH DIFFERENTIAL/PLATELET
Basophils Absolute: 0.1 10*3/uL (ref 0.0–0.1)
Basophils Relative: 1 % (ref 0.0–3.0)
Eosinophils Absolute: 0.5 10*3/uL (ref 0.0–0.7)
Eosinophils Relative: 8.9 % — ABNORMAL HIGH (ref 0.0–5.0)
HCT: 43.4 % (ref 39.0–52.0)
Hemoglobin: 14.8 g/dL (ref 13.0–17.0)
Lymphocytes Relative: 22.5 % (ref 12.0–46.0)
Lymphs Abs: 1.2 10*3/uL (ref 0.7–4.0)
MCHC: 34 g/dL (ref 30.0–36.0)
MCV: 95.5 fl (ref 78.0–100.0)
Monocytes Absolute: 0.5 10*3/uL (ref 0.1–1.0)
Monocytes Relative: 9.2 % (ref 3.0–12.0)
Neutro Abs: 3 10*3/uL (ref 1.4–7.7)
Neutrophils Relative %: 58.4 % (ref 43.0–77.0)
Platelets: 89 10*3/uL — ABNORMAL LOW (ref 150.0–400.0)
RBC: 4.55 Mil/uL (ref 4.22–5.81)
RDW: 13 % (ref 11.5–15.5)
WBC: 5.2 10*3/uL (ref 4.0–10.5)

## 2020-08-18 LAB — TSH: TSH: 2.19 u[IU]/mL (ref 0.35–4.50)

## 2020-08-18 LAB — LDL CHOLESTEROL, DIRECT: Direct LDL: 49 mg/dL

## 2020-08-18 LAB — PSA, MEDICARE: PSA: 2.11 ng/ml (ref 0.10–4.00)

## 2020-08-18 LAB — URIC ACID: Uric Acid, Serum: 5.9 mg/dL (ref 4.0–7.8)

## 2020-08-20 ENCOUNTER — Ambulatory Visit: Payer: PPO | Admitting: Internal Medicine

## 2020-08-20 ENCOUNTER — Encounter: Payer: Self-pay | Admitting: Internal Medicine

## 2020-08-20 ENCOUNTER — Other Ambulatory Visit: Payer: Self-pay

## 2020-08-20 VITALS — BP 182/102 | HR 66 | Ht 73.0 in | Wt 188.0 lb

## 2020-08-20 DIAGNOSIS — I639 Cerebral infarction, unspecified: Secondary | ICD-10-CM

## 2020-08-20 DIAGNOSIS — I10 Essential (primary) hypertension: Secondary | ICD-10-CM

## 2020-08-20 DIAGNOSIS — I5032 Chronic diastolic (congestive) heart failure: Secondary | ICD-10-CM

## 2020-08-20 DIAGNOSIS — E785 Hyperlipidemia, unspecified: Secondary | ICD-10-CM | POA: Diagnosis not present

## 2020-08-20 DIAGNOSIS — I5031 Acute diastolic (congestive) heart failure: Secondary | ICD-10-CM

## 2020-08-20 NOTE — Patient Instructions (Signed)
Medication Instructions:  Your physician recommends that you continue on your current medications as directed. Please refer to the Current Medication list given to you today.  *If you need a refill on your cardiac medications before your next appointment, please call your pharmacy*   Lab Work: None ordered.  If you have labs (blood work) drawn today and your tests are completely normal, you will receive your results only by: Marland Kitchen MyChart Message (if you have MyChart) OR . A paper copy in the mail If you have any lab test that is abnormal or we need to change your treatment, we will call you to review the results.   Testing/Procedures: None ordered.   Follow-Up: At [facility], you and your health needs are our priority.  As part of our continuing mission to provide you with exceptional heart care, we have created designated Provider Care Teams.  These Care Teams include your primary Cardiologist (physician) and Advanced Practice Providers (APPs -  Physician Assistants and Nurse Practitioners) who all work together to provide you with the care you need, when you need it.  We recommend signing up for the patient portal called "MyChart".  Sign up information is provided on this After Visit Summary.  MyChart is used to connect with patients for Virtual Visits (Telemedicine).  Patients are able to view lab/test results, encounter notes, upcoming appointments, etc.  Non-urgent messages can be sent to your provider as well.   To learn more about what you can do with MyChart, go to [email or web address].    Your next appointment:   3 month(s)  The format for your next appointment:   In Person  Provider:   You may see Dr. Saunders Revel or one of the following Advanced Practice Providers on your designated Care Team:    Murray Hodgkins, NP  Christell Faith, PA-C  Marrianne Mood, PA-C  Cadence Kathlen Mody, [place]    Other Instructions  Check blood pressure and heart rate daily and please send a  MyChart message to give our office an update on your readings.

## 2020-08-20 NOTE — Progress Notes (Signed)
Follow-up Outpatient Visit Date: 08/20/2020  Primary Care Provider: Abner Greenspan, MD [place] [address]  Chief Complaint: Elevated blood pressure  HPI:  [name] is a 79 y.o. male with history of stroke, hypertension, hyperlipidemia, and HFpEF, who presents for follow-up of hypertension, stroke, and HFpEF.  I last saw [name] in May following a hospitalization for acute shortness of breath felt to be due to HFpEF.  We subsequently performed an exercise tolerance test, which was low risk.  Since that time, [name] has been feeling well without chest pain, shortness of breath, palpitations, and lightheadedness.  He notes occasional leg edema which resolves with as needed torsemide.  [name] has been monitoring his blood pressure regularly and notes that most days it is normal.  He has had a couple of outliers, however, where his systolic readings are in the 160s.  Reports being compliant with his medications as well as following a low-sodium diet.  He has been under quite a bit of stress through work and wonders if this is contributing to his elevated blood pressure.  --------------------------------------------------------------------------------------------------  Cardiovascular History & Procedures: Cardiovascular Problems:  Cryptogenic stroke  Mildly dilated ascending aorta  Risk Factors:  Hypertension, hyperlipidemia, male gender, and age greater than 57  Cath/PCI:  None  CV Surgery:  None  EP Procedures and Devices:  Implantable loop recorder (12/21/16)  24-hour Holter monitor (10/20/16, [place]): Predominant rhythm was sinus with an average rate of 63 bpm (range 46-93 bpm). Rare supraventricular and ventricular ectopy was identified. There was one episode of nonsustained ventricular tachycardia lasting 4 beats. No sustained arrhythmias, including atrial fibrillation/flutter) are evident.  Non-Invasive Evaluation(s):  Exercise  tolerance test (03/27/2020): Low risk study without evidence of ischemia.  Rare isolated PVCs noted before and during stress.  TTE (02/05/2020, [facility]): Normal LV size with mild LVH.  LVEF 55-60% with grade 2 diastolic dysfunction and elevated filling pressures.  Normal RV size and function.  Moderate left and mild right atrial enlargement.  Mild to moderate mitral regurgitation.  Mild tricuspid regurgitation.  Aortic sclerosis.  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   TEE (12/21/16): Normal LV size with mild LVH. LVEF 55-65%. Mild aortic regurgitation. Mildly dilated ascending aorta. Mild mitral regurgitation. No left or right atrial thrombus. No PFO or ASD; negative  TTE (12/02/16): Normal LV size with mild LVH. LVEF 55-60% with normal wall motion. Normal diastolic function. Moderate AI. Mildly dilated ascending aorta measuring 3.9 cm. Mild MR. Normal RV size and function.  Transthoracic echocardiogram (10/2016; [place]): Reportedly normal, though no images or reports is available for review.  Recent CV Pertinent Labs: Lab Results  Component Value Date   CHOL 128 08/18/2020   HDL 41.40 08/18/2020   LDLCALC 25 01/10/2017   LDLDIRECT 49.0 08/18/2020   TRIG 262.0 (H) 08/18/2020   CHOLHDL 3 08/18/2020   INR 1.1 12/20/2018   K 4.2 08/18/2020   K 3.9 05/15/2009   BUN 16 08/18/2020   BUN 12 05/15/2009   CREATININE 0.77 08/18/2020   CREATININE 0.8 05/15/2009    Past medical and surgical history were reviewed and updated in EPIC.  Current Meds  Medication Sig  . allopurinol (ZYLOPRIM) 300 MG tablet TAKE 1 TABLET(300 MG) BY MOUTH DAILY AT NIGHT  . Ascorbic Acid (VITAMIN C) 1000 MG tablet Take 1,000 mg by mouth daily.    Marland Kitchen aspirin EC 81 MG tablet Take 162 mg by mouth 2 (two)  times daily.  Marland Kitchen atorvastatin (LIPITOR) 20 MG tablet TAKE 1 TABLET(20 MG) BY MOUTH DAILY  . bisoprolol (ZEBETA) 5 MG tablet TAKE 1 AND 1/2 TABLETS(7.5 MG) BY MOUTH EVERY DAY  .  calcium carbonate (TUMS EX) 750 MG chewable tablet Chew 1 tablet by mouth every 2 (two) hours as needed for heartburn.  . Calcium-Magnesium-Zinc 1000-400-15 MG TABS Take 1 tablet by mouth daily.    Marland Kitchen lisinopril (ZESTRIL) 40 MG tablet Take 1 tablet (40 mg total) by mouth daily.  . meloxicam (MOBIC) 15 MG tablet Take 15 mg by mouth daily as needed.  . Multiple Vitamin (MULTIVITAMIN) tablet Take 1 tablet by mouth daily.  . Omega-3 Fatty Acids (FISH OIL PO) Take 1 capsule by mouth daily.   . potassium chloride (K-DUR) 10 MEQ tablet Take 10 mEq by mouth daily as needed. for weight gain >2 lbs in 24 hr, >5 lb in 1 week, and leg swelling.  . torsemide (DEMADEX) 20 MG tablet Take 1 tablet (20 mg total) by mouth daily as needed (for weight gain >2 lbs in 24 hr, >5 lb in 1 week, and leg swelling.).  Marland Kitchen VITAMIN E PO Take 1 capsule by mouth daily.     Allergies: Patient has no known allergies.  Social History   Tobacco Use  . Smoking status: Former Smoker    Packs/day: 1.00    Years: 8.00    Pack years: 8.00    Types: Cigarettes    Quit date: 1973    Years since quitting: 48.8  . Smokeless tobacco: Never Used  Vaping Use  . Vaping Use: Never used  Substance Use Topics  . Alcohol use: Yes    Alcohol/week: 7.0 standard drinks    Types: 7 Glasses of wine per week    Comment: weekly-with dinner  . Drug use: No    Family History  Problem Relation Age of Onset  . Other Mother        brain tumor  . Skin cancer Mother        suspect melanoma  . Heart attack Father 89  . Colon cancer Sister     Review of Systems: A 12-system review of systems was performed and was negative except as noted in the HPI.  --------------------------------------------------------------------------------------------------  Physical Exam: BP (!) 182/102 (BP Location: Left Arm, Patient Position: Sitting, Cuff Size: Normal)   Pulse 66   Ht 6\' 1"  (1.854 m)   Wt 188 lb (85.3 kg)   SpO2 98%   BMI 24.80 kg/m    General: NAD. HEENT: No conjunctival pallor or scleral icterus. Facemask in place. Neck: No JVD or HJR. Lungs: Normal work of breathing. Clear to auscultation bilaterally without wheezes or crackles. Heart: Regular rate and rhythm without murmurs, rubs, or gallops. Non-displaced PMI. Abd: Bowel sounds present. Soft, NT/ND. Ext: Trace pretibial edema bilaterally.  EKG: Normal sinus rhythm without abnormality.  Lab Results  Component Value Date   WBC 5.2 08/18/2020   HGB 14.8 08/18/2020   HCT 43.4 08/18/2020   MCV 95.5 08/18/2020   PLT 89.0 (L) 08/18/2020    Lab Results  Component Value Date   NA 141 08/18/2020   K 4.2 08/18/2020   CL 103 08/18/2020   CO2 31 08/18/2020   BUN 16 08/18/2020   CREATININE 0.77 08/18/2020   GLUCOSE 121 (H) 08/18/2020   ALT 52 08/18/2020    Lab Results  Component Value Date   CHOL 128 08/18/2020   HDL 41.40 08/18/2020   [place]  25 01/10/2017   LDLDIRECT 49.0 08/18/2020   TRIG 262.0 (H) 08/18/2020   CHOLHDL 3 08/18/2020    --------------------------------------------------------------------------------------------------  ASSESSMENT AND PLAN: Hypertension: Blood pressure poorly controlled today with only small improvement on repeat (blood pressure down to 170/86).  Most home readings have been in the normal range with only a few recent outliers.  [name] is compliant with his medications as well as a low-sodium diet.  It is possible that stress is exacerbating his hypertension.  Given mostly normal readings at home over the last few weeks, we have agreed to defer medication changes today.  [name] is scheduled to see his PCP tomorrow to discuss his stress, which blood pressure can be rechecked.  I also asked him to continue monitoring his blood pressure at home and to contact us in about 2 weeks with the readings.  If his blood pressure remains above goal, addition of another agent will need to be considered.  Cryptogenic stroke: No new  neurologic changes reported.  Loop recorder has not shown evidence of atrial fibrillation.  We will continue current medications for secondary prevention including aspirin, atorvastatin, and antihypertensive regimen.  Chronic HFpEF: Other than trace edema on examination today, Jeffrey [name] appears euvolemic.  He has not had any recurrent dyspnea since our last visit in May.  He can continue to use torsemide on an as-needed basis.  Exercise tolerance test in June was low risk.  No further testing recommended at this time.  Hyperlipidemia: Lipid panel earlier this week showed mildly elevated triglycerides but excellent LDL (49).  We will defer medication changes today and keep working on lifestyle modifications.  Follow-up: Return to clinic in 3 months.  Nelva Bush, MD 08/21/2020 6:55 AM

## 2020-08-21 ENCOUNTER — Encounter: Payer: Self-pay | Admitting: Family Medicine

## 2020-08-21 ENCOUNTER — Ambulatory Visit (INDEPENDENT_AMBULATORY_CARE_PROVIDER_SITE_OTHER): Payer: PPO | Admitting: Family Medicine

## 2020-08-21 ENCOUNTER — Encounter: Payer: Self-pay | Admitting: Internal Medicine

## 2020-08-21 VITALS — BP 136/78 | HR 61 | Temp 96.9°F | Ht 71.5 in | Wt 186.3 lb

## 2020-08-21 DIAGNOSIS — I10 Essential (primary) hypertension: Secondary | ICD-10-CM

## 2020-08-21 DIAGNOSIS — Z0000 Encounter for general adult medical examination without abnormal findings: Secondary | ICD-10-CM | POA: Diagnosis not present

## 2020-08-21 DIAGNOSIS — M109 Gout, unspecified: Secondary | ICD-10-CM

## 2020-08-21 DIAGNOSIS — Z125 Encounter for screening for malignant neoplasm of prostate: Secondary | ICD-10-CM

## 2020-08-21 DIAGNOSIS — Z23 Encounter for immunization: Secondary | ICD-10-CM | POA: Diagnosis not present

## 2020-08-21 DIAGNOSIS — D696 Thrombocytopenia, unspecified: Secondary | ICD-10-CM

## 2020-08-21 DIAGNOSIS — R7303 Prediabetes: Secondary | ICD-10-CM

## 2020-08-21 DIAGNOSIS — E7800 Pure hypercholesterolemia, unspecified: Secondary | ICD-10-CM

## 2020-08-21 MED ORDER — BISOPROLOL FUMARATE 5 MG PO TABS
ORAL_TABLET | ORAL | 3 refills | Status: DC
Start: 2020-08-21 — End: 2021-12-09

## 2020-08-21 MED ORDER — ATORVASTATIN CALCIUM 20 MG PO TABS
ORAL_TABLET | ORAL | 3 refills | Status: DC
Start: 2020-08-21 — End: 2021-12-11

## 2020-08-21 MED ORDER — ALLOPURINOL 300 MG PO TABS
ORAL_TABLET | ORAL | 3 refills | Status: DC
Start: 2020-08-21 — End: 2021-11-25

## 2020-08-21 NOTE — Assessment & Plan Note (Signed)
Reviewed health habits including diet and exercise and skin cancer prevention Reviewed appropriate screening tests for age  Also reviewed health mt list, fam hx and immunization status , as well as social and family history   See HPI Labs reviewed  Flu vaccine given  Enc him to get 2nd shingrix vaccine Immunized for covid Advance directive done and up to date Hearing screen is reassuring  Vision screen also ok 20/25 both eyes  No cognitive concerns

## 2020-08-21 NOTE — Assessment & Plan Note (Signed)
Platelet ct is down to 86  No symptoms of bruising/bleeding  No recent trauma or infection  Plan to re check with path rev in 2 weeks  If still under 100 consider hematology ref

## 2020-08-21 NOTE — Assessment & Plan Note (Signed)
bp in fair control at this time  BP Readings from Last 1 Encounters:  08/21/20 136/78   No changes needed Most recent labs reviewed  Disc lifstyle change with low sodium diet and exercise  Plan to continue lisinopril 40 mg and toresmide 20 mg and bisoprolol 7.5 mg daily  occ ankle edema at night

## 2020-08-21 NOTE — Patient Instructions (Addendum)
Get your 2nd shingrix vaccine   Flu shot today  Try to exercise twice daily (if only for 10-15 minutes)  Chair yoga and exercise bands or weights are good   I want to re check your platelet count in about 2 weeks  If you develop any bleeding issues let me know

## 2020-08-21 NOTE — Progress Notes (Signed)
Subjective:    Patient ID: Jeffrey [name], male    DOB: 1941/07/15, 79 y.o.   MRN: [id number]  This visit occurred during the SARS-CoV-2 public health emergency.  Safety protocols were in place, including screening questions prior to the visit, additional usage of staff PPE, and extensive cleaning of exam room while observing appropriate contact time as indicated for disinfecting solutions.    HPI Pt presents for amw and health mt exam with rev of chronic medical problems  I have personally reviewed the Medicare Annual Wellness questionnaire and have noted 1. The patient's medical and social history 2. Their use of alcohol, tobacco or illicit drugs 3. Their current medications and supplements 4. The patient's functional ability including ADL's, fall risks, home safety risks and hearing or visual             impairment. 5. Diet and physical activities 6. Evidence for depression or mood disorders  The patients weight, height, BMI have been recorded in the chart and visual acuity is per eye clinic.  I have made referrals, counseling and provided education to the patient based review of the above and I have provided the pt with a written personalized care plan for preventive services. Reviewed and updated provider list, see scanned forms.  See scanned forms.  Routine anticipatory guidance given to patient.  See health maintenance. Colon cancer screening-colonoscopy 4/19  Flu vaccine-today Tetanus vaccine 10/14 Td Pneumovax completed Zoster vaccine covid vaccine 10/21 pfizer  Falls-none Fractures=none Supplements-none Exercise -not as much as in the past  Prostate cancer screening Lab Results  Component Value Date   PSA 2.11 08/18/2020   PSA 1.82 04/23/2019   PSA 1.30 01/11/2018  no urinary changes    Advance directive-done and utd Cognitive function addressed- see scanned forms- and if abnormal then additional documentation follows.  No changes Able to keep up with  work/running financials   PMH and SH reviewed  Meds, vitals, and allergies reviewed.   ROS: See HPI.  Otherwise negative.    Weight : Wt Readings from Last 3 Encounters:  08/21/20 186 lb 5 oz (84.5 kg)  08/20/20 188 lb (85.3 kg)  02/29/20 175 lb 6 oz (79.5 kg)   25.62 kg/m    Hearing/vision:  Hearing Screening   125Hz  250Hz  500Hz  1000Hz  2000Hz  3000Hz  4000Hz  6000Hz  8000Hz   Right ear:   40 40 40  0    Left ear:   40 40 40  0      Visual Acuity Screening   Right eye Left eye Both eyes  Without correction:     With correction: 20/40 20/40 20/25      Care team Brooklynne Pereida-pcp End-cardiologist [facility]  HTN bp is stable today  No cp or palpitations or headaches or edema  No side effects to medicines  BP Readings from Last 3 Encounters:  08/21/20 136/78  08/20/20 (!) 182/102  02/29/20 138/86     Taking lisinopril 40 mg daily  Torsemide 20 mg daily prn Bisoprolol 7.5 mg daily  Pulse Readings from Last 3 Encounters:  08/21/20 61  08/20/20 66  02/29/20 (!) 57   BP goes up with stress Works in Psychologist, educational- few tough years  At his age - able to work from home - does not want to quit    Gout Takes allopurinol Lab Results  Component Value Date   LABURIC 5.9 08/18/2020   Lab Results  Component Value Date   CREATININE 0.77 08/18/2020   BUN 16 08/18/2020   NA  141 08/18/2020   K 4.2 08/18/2020   CL 103 08/18/2020   CO2 31 08/18/2020    Hyperlipidemia  Lab Results  Component Value Date   CHOL 128 08/18/2020   CHOL 124 04/23/2019   CHOL 111 01/11/2018   Lab Results  Component Value Date   HDL 41.40 08/18/2020   HDL 42.60 04/23/2019   HDL 44.50 01/11/2018   Lab Results  Component Value Date   LDLCALC 25 01/10/2017   LDLCALC 65 01/12/2016   LDLCALC 83 12/16/2010   Lab Results  Component Value Date   TRIG 262.0 (H) 08/18/2020   TRIG 342.0 (H) 04/23/2019   TRIG 309.0 (H) 01/11/2018   Lab Results  Component Value Date   CHOLHDL 3 08/18/2020     CHOLHDL 3 04/23/2019   CHOLHDL 3 01/11/2018   Lab Results  Component Value Date   LDLDIRECT 49.0 08/18/2020   LDLDIRECT 42.0 04/23/2019   LDLDIRECT 34.0 01/11/2018  atorvastatin 20   Prediabetes Lab Results  Component Value Date   HGBA1C 6.1 08/18/2020   Lab Results  Component Value Date   WBC 5.2 08/18/2020   HGB 14.8 08/18/2020   HCT 43.4 08/18/2020   MCV 95.5 08/18/2020   PLT 89.0 (L) 08/18/2020  h/o thrombocytopenia  Patient Active Problem List   Diagnosis Date Noted  . Heart failure with preserved ejection fraction ([place]) 03/01/2020  . Shortness of breath 03/01/2020  . CHF (congestive heart failure) ([place]) 02/22/2020  . Medicare annual wellness visit, subsequent 04/23/2019  . Paresthesia of hand 02/12/2019  . Left groin pain 02/14/2018  . 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 Cryptogenic stroke ([place]) 11/08/2016  . Blurred vision 11/05/2014  . Prostate cancer screening 11/05/2014  . Family history of colon cancer 08/01/2013  . Colon cancer screening 08/01/2013  . Routine general medical examination at a health care facility 08/01/2013  . PSA, INCREASED 09/07/2010  . 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 Thrombocytopenia ([place]) 01/16/2009  . Hyperlipidemia 06/08/2007  . Gout 06/08/2007  . Essential hypertension 06/08/2007  . Prediabetes 06/08/2007  . SKIN CANCER, HX OF 06/08/2007   Past Medical History:  Diagnosis Date  . DDD (degenerative disc disease)    with epidural injection  . Gastritis    H pylori, partial tx.  EGD 04/2002  . Gout   . History of skin cancer   . Hyperlipidemia   . Hypertension    some white coat component- better at home  . Stroke ([place])   . TIA (transient ischemic attack)    Past Surgical History:  Procedure Laterality Date  . COLONOSCOPY WITH PROPOFOL N/A 01/16/2018   Procedure: COLONOSCOPY WITH PROPOFOL;  Surgeon: Lollie Sails, MD;  Location: [facility] ENDOSCOPY;  Service: Endoscopy;   Laterality: N/A;  . [place]  . LOOP RECORDER INSERTION N/A 12/21/2016   Procedure: Loop Recorder Insertion;  Surgeon: Deboraha Sprang, MD;  Location: [place] CV LAB;  Service: Cardiovascular;  Laterality: N/A;  . TEE WITHOUT CARDIOVERSION N/A 12/21/2016   Procedure: TRANSESOPHAGEAL ECHOCARDIOGRAM (TEE);  Surgeon: Nelva Bush, MD;  Location: [facility] ORS;  Service: Cardiovascular;  Laterality: N/A;   Social History   Tobacco Use  . Smoking status: Former Smoker    Packs/day: 1.00    Years: 8.00    Pack years: 8.00    Types: Cigarettes    Quit date: 1973    Years since quitting: 48.8  . Smokeless tobacco: Never Used  Vaping Use  .  Vaping Use: Never used  Substance Use Topics  . Alcohol use: Yes    Alcohol/week: 7.0 standard drinks    Types: 7 Glasses of wine per week    Comment: weekly-with dinner  . Drug use: No   Family History  Problem Relation Age of Onset  . Other Mother        brain tumor  . Skin cancer Mother        suspect melanoma  . Heart attack Father 47  . Colon cancer Sister    No Known Allergies Current Outpatient Medications on File Prior to Visit  Medication Sig Dispense Refill  . Ascorbic Acid (VITAMIN C) 1000 MG tablet Take 1,000 mg by mouth daily.      Marland Kitchen aspirin EC 81 MG tablet Take 162 mg by mouth 2 (two) times daily.    . calcium carbonate (TUMS EX) 750 MG chewable tablet Chew 1 tablet by mouth every 2 (two) hours as needed for heartburn.    . Calcium-Magnesium-Zinc 1000-400-15 MG TABS Take 1 tablet by mouth daily.      Marland Kitchen lisinopril (ZESTRIL) 40 MG tablet Take 1 tablet (40 mg total) by mouth daily. 90 tablet 3  . meloxicam (MOBIC) 15 MG tablet Take 15 mg by mouth daily as needed.    . Multiple Vitamin (MULTIVITAMIN) tablet Take 1 tablet by mouth daily.    . Omega-3 Fatty Acids (FISH OIL PO) Take 1 capsule by mouth daily.     . potassium chloride (K-DUR) 10 MEQ tablet Take 10 mEq by mouth daily as needed. for weight gain >2 lbs in  24 hr, >5 lb in 1 week, and leg swelling.    . torsemide (DEMADEX) 20 MG tablet Take 1 tablet (20 mg total) by mouth daily as needed (for weight gain >2 lbs in 24 hr, >5 lb in 1 week, and leg swelling.). 30 tablet 1  . VITAMIN E PO Take 1 capsule by mouth daily.      No current facility-administered medications on file prior to visit.     Review of Systems  Constitutional: Negative for activity change, appetite change, fatigue, fever and unexpected weight change.  HENT: Negative for congestion, rhinorrhea, sore throat and trouble swallowing.   Eyes: Negative for pain, redness, itching and visual disturbance.  Respiratory: Negative for cough, chest tightness, shortness of breath and wheezing.   Cardiovascular: Negative for chest pain and palpitations.  Gastrointestinal: Negative for abdominal pain, blood in stool, constipation, diarrhea and nausea.  Endocrine: Negative for cold intolerance, heat intolerance, polydipsia and polyuria.  Genitourinary: Negative for difficulty urinating, dysuria, frequency and urgency.  Musculoskeletal: Positive for arthralgias. Negative for joint swelling and myalgias.       Hip pain   Skin: Negative for pallor and rash.  Neurological: Negative for dizziness, tremors, weakness, numbness and headaches.  Hematological: Negative for adenopathy. Does not bruise/bleed easily.  Psychiatric/Behavioral: Negative for decreased concentration and dysphoric mood. The patient is not nervous/anxious.        Objective:   Physical Exam Constitutional:      General: He is not in acute distress.    Appearance: Normal appearance. He is well-developed and normal weight. He is not ill-appearing or diaphoretic.  HENT:     Head: Normocephalic and atraumatic.     Right Ear: Tympanic membrane, ear canal and external ear normal.     Left Ear: Tympanic membrane, ear canal and external ear normal.     Nose: Nose normal. No congestion.  Mouth/Throat:     Mouth: Mucous membranes  are moist.     Pharynx: Oropharynx is clear. No posterior oropharyngeal erythema.  Eyes:     General: No scleral icterus.       Right eye: No discharge.        Left eye: No discharge.     Conjunctiva/sclera: Conjunctivae normal.     Pupils: Pupils are equal, round, and reactive to light.  Neck:     Thyroid: No thyromegaly.     Vascular: No carotid bruit or JVD.  Cardiovascular:     Rate and Rhythm: Normal rate and regular rhythm.     Pulses: Normal pulses.     Heart sounds: Normal heart sounds. No gallop.   Pulmonary:     Effort: Pulmonary effort is normal. No respiratory distress.     Breath sounds: Normal breath sounds. No wheezing or rales.     Comments: Good air exch Chest:     Chest wall: No tenderness.  Abdominal:     General: Bowel sounds are normal. There is no distension or abdominal bruit.     Palpations: Abdomen is soft. There is no mass.     Tenderness: There is no abdominal tenderness.     Hernia: No hernia is present.  Musculoskeletal:        General: No tenderness.     Cervical back: Normal range of motion and neck supple. No rigidity. No muscular tenderness.     Right lower leg: No edema.     Left lower leg: No edema.     Comments: No kyphosis   Lymphadenopathy:     Cervical: No cervical adenopathy.  Skin:    General: Skin is warm and dry.     Coloration: Skin is not pale.     Findings: No erythema or rash.     Comments: Tanned with lentigines   Neurological:     Mental Status: He is alert.     Cranial Nerves: No cranial nerve deficit.     Motor: No abnormal muscle tone.     Coordination: Coordination normal.     Gait: Gait normal.     Deep Tendon Reflexes: Reflexes are normal and symmetric. Reflexes normal.  Psychiatric:        Mood and Affect: Mood normal.        Cognition and Memory: Cognition and memory normal.           Assessment & Plan:   Problem List Items Addressed This Visit      Cardiovascular and Mediastinum   Essential  hypertension    bp in fair control at this time  BP Readings from Last 1 Encounters:  08/21/20 136/78   No changes needed Most recent labs reviewed  Disc lifstyle change with low sodium diet and exercise  Plan to continue lisinopril 40 mg and toresmide 20 mg and bisoprolol 7.5 mg daily  occ ankle edema at night        Relevant Medications   atorvastatin (LIPITOR) 20 MG tablet   bisoprolol (ZEBETA) 5 MG tablet     Other   Hyperlipidemia    Disc goals for lipids and reasons to control them Rev last labs with pt Rev low sat fat diet in detail Trig down to 262 Plan to continue atorvastatin 20 mg daily and diet       Relevant Medications   atorvastatin (LIPITOR) 20 MG tablet   bisoprolol (ZEBETA) 5 MG tablet   Gout  No flares with allopurinol Lab Results  Component Value Date   LABURIC 5.9 08/18/2020    Nl renal labs      Thrombocytopenia (HCC)    Platelet ct is down to 86  No symptoms of bruising/bleeding  No recent trauma or infection  Plan to re check with path rev in 2 weeks  If still under 100 consider hematology ref      Relevant Orders   CBC with Differential/Platelet   Pathologist smear review   Prediabetes    Lab Results  Component Value Date   HGBA1C 6.1 08/18/2020   disc imp of low glycemic diet and wt loss to prevent DM2       Routine general medical examination at a health care facility    Reviewed health habits including diet and exercise and skin cancer prevention Reviewed appropriate screening tests for age  Also reviewed health mt list, fam hx and immunization status , as well as social and family history   See HPI Labs reviewed  Flu vaccine given  Enc him to get 2nd shingrix vaccine Immunized for covid Advance directive done and up to date Hearing screen is reassuring  Vision screen also ok 20/25 both eyes  No cognitive concerns       Prostate cancer screening    Lab Results  Component Value Date   PSA 2.11 08/18/2020   PSA  1.82 04/23/2019   PSA 1.30 01/11/2018  no urinary changes or nocturia         Medicare annual wellness visit, subsequent - Primary    Reviewed health habits including diet and exercise and skin cancer prevention Reviewed appropriate screening tests for age  Also reviewed health mt list, fam hx and immunization status , as well as social and family history   See HPI Labs reviewed  Flu vaccine given  Enc him to get 2nd shingrix vaccine Immunized for covid Advance directive done and up to date Hearing screen is reassuring  Vision screen also ok 20/25 both eyes  No cognitive concerns       Other Visit Diagnoses    Need for influenza vaccination       Relevant Orders   Flu Vaccine QUAD High Dose(Fluad) (Completed)

## 2020-08-21 NOTE — Assessment & Plan Note (Signed)
Lab Results  ?Component Value Date  ? HGBA1C 6.1 08/18/2020  ? ?disc imp of low glycemic diet and wt loss to prevent DM2  ?

## 2020-08-21 NOTE — Assessment & Plan Note (Signed)
Lab Results  Component Value Date   PSA 2.11 08/18/2020   PSA 1.82 04/23/2019   PSA 1.30 01/11/2018  no urinary changes or nocturia

## 2020-08-21 NOTE — Assessment & Plan Note (Signed)
Disc goals for lipids and reasons to control them Rev last labs with pt Rev low sat fat diet in detail Trig down to 262 Plan to continue atorvastatin 20 mg daily and diet

## 2020-08-21 NOTE — Assessment & Plan Note (Signed)
No flares with allopurinol Lab Results  Component Value Date   LABURIC 5.9 08/18/2020    Nl renal labs

## 2020-09-10 ENCOUNTER — Other Ambulatory Visit: Payer: Self-pay

## 2020-09-10 ENCOUNTER — Other Ambulatory Visit (INDEPENDENT_AMBULATORY_CARE_PROVIDER_SITE_OTHER): Payer: PPO

## 2020-09-10 DIAGNOSIS — D696 Thrombocytopenia, unspecified: Secondary | ICD-10-CM

## 2020-09-10 NOTE — Addendum Note (Signed)
Addended by: Cloyd Stagers on: 09/10/2020 08:07 AM   Modules accepted: Orders

## 2020-09-11 LAB — PATHOLOGIST SMEAR REVIEW

## 2020-09-11 LAB — CBC WITH DIFFERENTIAL/PLATELET
Absolute Monocytes: 504 {cells}/uL (ref 200–950)
Basophils Absolute: 18 {cells}/uL (ref 0–200)
Basophils Relative: 0.4 %
Eosinophils Absolute: 356 {cells}/uL (ref 15–500)
Eosinophils Relative: 7.9 %
HCT: 43.3 % (ref 38.5–50.0)
Hemoglobin: 14.8 g/dL (ref 13.2–17.1)
Lymphs Abs: 707 {cells}/uL — ABNORMAL LOW (ref 850–3900)
MCH: 32.3 pg (ref 27.0–33.0)
MCHC: 34.2 g/dL (ref 32.0–36.0)
MCV: 94.5 fL (ref 80.0–100.0)
MPV: 12.7 fL — ABNORMAL HIGH (ref 7.5–12.5)
Monocytes Relative: 11.2 %
Neutro Abs: 2916 {cells}/uL (ref 1500–7800)
Neutrophils Relative %: 64.8 %
Platelets: 101 Thousand/uL — ABNORMAL LOW (ref 140–400)
RBC: 4.58 Million/uL (ref 4.20–5.80)
RDW: 12.9 % (ref 11.0–15.0)
Total Lymphocyte: 15.7 %
WBC: 4.5 Thousand/uL (ref 3.8–10.8)

## 2020-10-20 ENCOUNTER — Telehealth: Payer: Self-pay | Admitting: Family Medicine

## 2020-10-20 DIAGNOSIS — D696 Thrombocytopenia, unspecified: Secondary | ICD-10-CM

## 2020-10-20 NOTE — Telephone Encounter (Signed)
-----   Message from Cloyd Stagers, RT sent at 10/06/2020  2:17 PM EST ----- Regarding: Lab Orders for Tuesday 1.11.2022 Please place lab orders for Tuesday 1.11.2022, appt notes state "fasting labs" Thank you, Dyke Maes RT(R)

## 2020-10-21 ENCOUNTER — Other Ambulatory Visit: Payer: Self-pay

## 2020-10-21 ENCOUNTER — Other Ambulatory Visit (INDEPENDENT_AMBULATORY_CARE_PROVIDER_SITE_OTHER): Payer: PPO

## 2020-10-21 DIAGNOSIS — D696 Thrombocytopenia, unspecified: Secondary | ICD-10-CM

## 2020-10-21 LAB — CBC WITH DIFFERENTIAL/PLATELET
Basophils Absolute: 0 10*3/uL (ref 0.0–0.1)
Basophils Relative: 0.8 % (ref 0.0–3.0)
Eosinophils Absolute: 0.3 10*3/uL (ref 0.0–0.7)
Eosinophils Relative: 7 % — ABNORMAL HIGH (ref 0.0–5.0)
HCT: 43.2 % (ref 39.0–52.0)
Hemoglobin: 14.8 g/dL (ref 13.0–17.0)
Lymphocytes Relative: 21 % (ref 12.0–46.0)
Lymphs Abs: 0.9 10*3/uL (ref 0.7–4.0)
MCHC: 34.2 g/dL (ref 30.0–36.0)
MCV: 96.2 fl (ref 78.0–100.0)
Monocytes Absolute: 0.4 10*3/uL (ref 0.1–1.0)
Monocytes Relative: 8.5 % (ref 3.0–12.0)
Neutro Abs: 2.7 10*3/uL (ref 1.4–7.7)
Neutrophils Relative %: 62.7 % (ref 43.0–77.0)
Platelets: 85 10*3/uL — ABNORMAL LOW (ref 150.0–400.0)
RBC: 4.49 Mil/uL (ref 4.22–5.81)
RDW: 13.3 % (ref 11.5–15.5)
WBC: 4.3 10*3/uL (ref 4.0–10.5)

## 2020-10-22 ENCOUNTER — Telehealth: Payer: Self-pay | Admitting: Family Medicine

## 2020-10-22 DIAGNOSIS — D696 Thrombocytopenia, unspecified: Secondary | ICD-10-CM

## 2020-10-22 NOTE — Telephone Encounter (Signed)
-----   Message from Tammi Sou, [place] sent at 10/22/2020 12:40 PM EST ----- Pt notified of lab results and Dr. Marliss Coots comments. Pt said he wanted Dr. Glori Bickers to know that after his stroke a few years ago he was advise to take 3 baby asa daily (81mg ) and he has been doing that every day since then. Pt is okay if Dr. Glori Bickers thinks he still needs to see a hematologist but he wanted to double check to see if him taking that much asa could be the cause of his low platelets

## 2020-10-22 NOTE — Telephone Encounter (Signed)
Thanks for letting me know  Doubt this plays a role  Ref done

## 2020-10-23 NOTE — Telephone Encounter (Signed)
That is entirely up to him Can cancel the referral  Re check cbc 2-3 mo if he is open to that  Watch for bruising/bleeding

## 2020-10-23 NOTE — Telephone Encounter (Signed)
Pt notified. Pt also said that he remembered this same issues happened over 10 yrs ago when his platelets were really low and PCP sent him to a hematologist and they didn't find any cause of his low #s. Pt just doesn't want to go back to the hematologist if that's going to be the same situation. Pt said he feels fine and he has no bleeding or bruising issues. Pt just wanted to see what Dr. Glori Bickers though since he's already done this before

## 2020-10-24 NOTE — Telephone Encounter (Signed)
Per pt referral cancelled and f/u labs scheduled in 2 months. Pt will let us know if he does develop bruising or bleeding but said he's fine for now.   FYI to PCP

## 2020-11-06 ENCOUNTER — Encounter: Payer: Self-pay | Admitting: *Deleted

## 2020-11-06 ENCOUNTER — Emergency Department: Payer: PPO

## 2020-11-06 ENCOUNTER — Other Ambulatory Visit: Payer: Self-pay

## 2020-11-06 DIAGNOSIS — Z85828 Personal history of other malignant neoplasm of skin: Secondary | ICD-10-CM | POA: Insufficient documentation

## 2020-11-06 DIAGNOSIS — R200 Anesthesia of skin: Secondary | ICD-10-CM | POA: Diagnosis not present

## 2020-11-06 DIAGNOSIS — Z7982 Long term (current) use of aspirin: Secondary | ICD-10-CM | POA: Insufficient documentation

## 2020-11-06 DIAGNOSIS — Z87891 Personal history of nicotine dependence: Secondary | ICD-10-CM | POA: Diagnosis not present

## 2020-11-06 DIAGNOSIS — Z79899 Other long term (current) drug therapy: Secondary | ICD-10-CM | POA: Insufficient documentation

## 2020-11-06 DIAGNOSIS — I509 Heart failure, unspecified: Secondary | ICD-10-CM | POA: Diagnosis not present

## 2020-11-06 DIAGNOSIS — R202 Paresthesia of skin: Secondary | ICD-10-CM | POA: Diagnosis not present

## 2020-11-06 DIAGNOSIS — M7989 Other specified soft tissue disorders: Secondary | ICD-10-CM | POA: Diagnosis not present

## 2020-11-06 DIAGNOSIS — I110 Hypertensive heart disease with heart failure: Secondary | ICD-10-CM | POA: Diagnosis not present

## 2020-11-06 DIAGNOSIS — M79602 Pain in left arm: Secondary | ICD-10-CM | POA: Diagnosis not present

## 2020-11-06 DIAGNOSIS — R29818 Other symptoms and signs involving the nervous system: Secondary | ICD-10-CM | POA: Diagnosis not present

## 2020-11-06 DIAGNOSIS — M25522 Pain in left elbow: Secondary | ICD-10-CM | POA: Diagnosis not present

## 2020-11-06 DIAGNOSIS — I10 Essential (primary) hypertension: Secondary | ICD-10-CM | POA: Diagnosis not present

## 2020-11-06 LAB — BASIC METABOLIC PANEL
Anion gap: 13 (ref 5–15)
BUN: 14 mg/dL (ref 8–23)
CO2: 28 mmol/L (ref 22–32)
Calcium: 9 mg/dL (ref 8.9–10.3)
Chloride: 100 mmol/L (ref 98–111)
Creatinine, Ser: 0.8 mg/dL (ref 0.61–1.24)
GFR, Estimated: >60 mL/min (ref >60)
Glucose, Bld: 185 mg/dL — ABNORMAL HIGH (ref 70–99)
Potassium: 3.5 mmol/L (ref 3.5–5.1)
Sodium: 141 mmol/L (ref 135–145)

## 2020-11-06 LAB — TROPONIN I (HIGH SENSITIVITY): Troponin I (High Sensitivity): 8 ng/L (ref <18)

## 2020-11-06 LAB — CBC
HCT: 45.6 % (ref 39.0–52.0)
Hemoglobin: 15.6 g/dL (ref 13.0–17.0)
MCH: 32.8 pg (ref 26.0–34.0)
MCHC: 34.2 g/dL (ref 30.0–36.0)
MCV: 96 fL (ref 80.0–100.0)
Platelets: 103 10*3/uL — ABNORMAL LOW (ref 150–400)
RBC: 4.75 MIL/uL (ref 4.22–5.81)
RDW: 12.6 % (ref 11.5–15.5)
WBC: 5.5 10*3/uL (ref 4.0–10.5)
nRBC: 0 % (ref 0.0–0.2)

## 2020-11-06 NOTE — ED Triage Notes (Addendum)
Pt ambulatory to triage.  Pt has left arm numbness.   No chest pain or sob  No headache.  No n/v/d.  Sx began after lunch today.  Hx htn  Pt took bp meds today.   Pt alert  Speech clear.

## 2020-11-07 ENCOUNTER — Emergency Department: Payer: PPO

## 2020-11-07 ENCOUNTER — Telehealth: Payer: Self-pay | Admitting: Family Medicine

## 2020-11-07 ENCOUNTER — Emergency Department
Admission: EM | Admit: 2020-11-07 | Discharge: 2020-11-07 | Disposition: A | Discharge status: Home / Self Care | Payer: PPO | Attending: Emergency Medicine | Admitting: Emergency Medicine

## 2020-11-07 DIAGNOSIS — I10 Essential (primary) hypertension: Secondary | ICD-10-CM

## 2020-11-07 DIAGNOSIS — M7989 Other specified soft tissue disorders: Secondary | ICD-10-CM | POA: Diagnosis not present

## 2020-11-07 DIAGNOSIS — M79602 Pain in left arm: Secondary | ICD-10-CM

## 2020-11-07 DIAGNOSIS — M25522 Pain in left elbow: Secondary | ICD-10-CM | POA: Diagnosis not present

## 2020-11-07 LAB — URINALYSIS, ROUTINE W REFLEX MICROSCOPIC
Bacteria, UA: NONE SEEN
Bilirubin Urine: NEGATIVE
Glucose, UA: NEGATIVE mg/dL
Ketones, ur: NEGATIVE mg/dL
Leukocytes,Ua: NEGATIVE
Nitrite: NEGATIVE
Protein, ur: NEGATIVE mg/dL
Specific Gravity, Urine: 1.012 (ref 1.005–1.030)
WBC, UA: NONE SEEN WBC/hpf (ref 0–5)
pH: 7 (ref 5.0–8.0)

## 2020-11-07 LAB — TROPONIN I (HIGH SENSITIVITY): Troponin I (High Sensitivity): 9 ng/L (ref <18)

## 2020-11-07 MED ORDER — AMLODIPINE BESYLATE 10 MG PO TABS: 10.0000 mg | ORAL_TABLET | Freq: Every day | ORAL | 1 refills | Status: AC

## 2020-11-07 MED ORDER — AMLODIPINE BESYLATE 5 MG PO TABS
5.0000 mg | ORAL_TABLET | Freq: Once | ORAL | Status: AC
  Administered 2020-11-07: 5 mg via ORAL
  Filled 2020-11-07: qty 1

## 2020-11-07 NOTE — Telephone Encounter (Signed)
Juliann Pulse with Access Nurse called in stating she triaged patient due to issues with BP and arm pain. Stated he was resulting in ER and is refusing to return. Stated he would like to just have Dr.Tower review his labs and notes he had done last night. Expressed Er precautions advised to patient but said he doesn't want to go back with fear they will tell him same thing as before. Please advise.

## 2020-11-07 NOTE — ED Provider Notes (Signed)
[facility] Emergency Department Provider Note  ____________________________________________   Event Date/Time   First MD Initiated Contact with Patient 11/07/20 0019     (approximate)  I have reviewed the triage vital signs and the nursing notes.   HISTORY  Chief Complaint Numbness    HPI Jeffrey [name] is a 80 y.o. male with history of hypertension, hyperlipidemia, stroke on aspirin who presents to the emergency department with complaints of left upper extremity pain.  He is unable to describe the pain.  States symptoms started yesterday and he thinks are due to his blood pressure being elevated which he thinks is due to increased stress.  He is on lisinopril 40 mg, bisoprolol 5 mg, torsemide 20 mg he reports compliance.  No recent changes in medications.  He states he noticed a "bubble" that is tender to the touch side portion of the left elbow.  No bruising.  No redness or warmth.  No fever.  No injury to the arm.  He denies any numbness or focal weakness.  No headache or vision changes.  No chest pain, chest tightness, shortness of breath, nausea, vomiting, diaphoresis or dizziness.  No previous history of PE or DVT.  He reports he used to check his blood pressure regularly at home and normally his systolic is in the 093G to 140s.        Past Medical History:  Diagnosis Date  . DDD (degenerative disc disease)    with epidural injection  . Gastritis    H pylori, partial tx.  EGD 04/2002  . Gout   . History of skin cancer   . Hyperlipidemia   . Hypertension    some white coat component- better at home  . Stroke ([place])   . TIA (transient ischemic attack)     Patient Active Problem List   Diagnosis Date Noted  . Heart failure with preserved ejection fraction ([place]) 03/01/2020  . Shortness of breath 03/01/2020  . CHF (congestive heart failure) ([place]) 02/22/2020  . Medicare annual wellness visit, subsequent 04/23/2019  . Paresthesia of hand 02/12/2019   . Left groin pain 02/14/2018  . 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 Cryptogenic stroke ([place]) 11/08/2016  . Blurred vision 11/05/2014  . Prostate cancer screening 11/05/2014  . Family history of colon cancer 08/01/2013  . Colon cancer screening 08/01/2013  . Routine general medical examination at a health care facility 08/01/2013  . PSA, INCREASED 09/07/2010  . 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 Thrombocytopenia ([place]) 01/16/2009  . Hyperlipidemia 06/08/2007  . Gout 06/08/2007  . Essential hypertension 06/08/2007  . Prediabetes 06/08/2007  . SKIN CANCER, HX OF 06/08/2007    Past Surgical History:  Procedure Laterality Date  . COLONOSCOPY WITH PROPOFOL N/A 01/16/2018   Procedure: COLONOSCOPY WITH PROPOFOL;  Surgeon: Lollie Sails, MD;  Location: [facility] ENDOSCOPY;  Service: Endoscopy;  Laterality: N/A;  . [place]  . LOOP RECORDER INSERTION N/A 12/21/2016   Procedure: Loop Recorder Insertion;  Surgeon: Deboraha Sprang, MD;  Location: [facility] CV LAB;  Service: Cardiovascular;  Laterality: N/A;  . TEE WITHOUT CARDIOVERSION N/A 12/21/2016   Procedure: TRANSESOPHAGEAL ECHOCARDIOGRAM (TEE);  Surgeon: Nelva Bush, MD;  Location: [facility] ORS;  Service: Cardiovascular;  Laterality: N/A;    Prior to Admission medications   Medication Sig Start Date End Date Taking? Authorizing Provider  amLODipine (NORVASC) 10 MG tablet Take 1 tablet (10 mg total) by mouth daily. 11/07/20 11/07/21  Yes Yassin Scales N, DO  allopurinol (ZYLOPRIM) 300 MG tablet TAKE 1 TABLET(300 MG) BY MOUTH DAILY AT NIGHT 08/21/20   Tower, Audrie Gallus, MD  Ascorbic Acid (VITAMIN C) 1000 MG tablet Take 1,000 mg by mouth daily.      [provider]  aspirin EC 81 MG tablet Take 162 mg by mouth 2 (two) times daily.    [provider]  atorvastatin (LIPITOR) 20 MG tablet TAKE 1 TABLET(20 MG) BY MOUTH DAILY 08/21/20   Tower, Audrie Gallus, MD  bisoprolol  (ZEBETA) 5 MG tablet TAKE 1 AND 1/2 TABLETS(7.5 MG) BY MOUTH EVERY DAY 08/21/20   Tower, Audrie Gallus, MD  calcium carbonate (TUMS EX) 750 MG chewable tablet Chew 1 tablet by mouth every 2 (two) hours as needed for heartburn.    [provider]  Calcium-Magnesium-Zinc 1000-400-15 MG TABS Take 1 tablet by mouth daily.      [provider]  lisinopril (ZESTRIL) 40 MG tablet Take 1 tablet (40 mg total) by mouth daily. 03/24/20   End, Cristal Deer, MD  meloxicam (MOBIC) 15 MG tablet Take 15 mg by mouth daily as needed. 03/24/20   [provider]  Multiple Vitamin (MULTIVITAMIN) tablet Take 1 tablet by mouth daily.    [provider]  Omega-3 Fatty Acids (FISH OIL PO) Take 1 capsule by mouth daily.     [provider]  potassium chloride (K-DUR) 10 MEQ tablet Take 10 mEq by mouth daily as needed. for weight gain >2 lbs in 24 hr, >5 lb in 1 week, and leg swelling.    [provider]  torsemide (DEMADEX) 20 MG tablet Take 1 tablet (20 mg total) by mouth daily as needed (for weight gain >2 lbs in 24 hr, >5 lb in 1 week, and leg swelling.). 02/29/20   End, Cristal Deer, MD  VITAMIN E PO Take 1 capsule by mouth daily.     [provider]    Allergies Patient has no known allergies.  Family History  Problem Relation Age of Onset  . Other Mother        brain tumor  . Skin cancer Mother        suspect melanoma  . Heart attack Father 53  . Colon cancer Sister     Social History Social History   Tobacco Use  . Smoking status: Former Smoker    Packs/day: 1.00    Years: 8.00    Pack years: 8.00    Types: Cigarettes    Quit date: 1973    Years since quitting: 49.1  . Smokeless tobacco: Never Used  Vaping Use  . Vaping Use: Never used  Substance Use Topics  . Alcohol use: Not Currently    Alcohol/week: 7.0 standard drinks    Types: 7 Glasses of wine per week    Comment: weekly-with dinner  . Drug use: No    Review of  Systems Constitutional: No fever. Eyes: No visual changes. ENT: No sore throat. Cardiovascular: Denies chest pain. Respiratory: Denies shortness of breath. Gastrointestinal: No nausea, vomiting, diarrhea. Genitourinary: Negative for dysuria. Musculoskeletal: Negative for back pain. Skin: Negative for rash. Neurological: Negative for focal weakness or numbness.  ____________________________________________   PHYSICAL EXAM:  VITAL SIGNS: ED Triage Vitals  Enc Vitals Group     BP 11/06/20 2307 (!) 197/95     Pulse Rate 11/06/20 2307 77     Resp 11/06/20 2307 18     Temp 11/06/20 2307 98 F (36.7 C)  Temp Source 11/06/20 2307 Oral     SpO2 11/06/20 2307 98 %     Weight 11/06/20 2305 178 lb (80.7 kg)     Height 11/06/20 2305 6\' 1"  (1.854 m)     Head Circumference --      Peak Flow --      Pain Score 11/06/20 2304 5     Pain Loc --      Pain Edu? --      Excl. in [place]? --    CONSTITUTIONAL: Alert and oriented and responds appropriately to questions. Well-appearing; well-nourished HEAD: Normocephalic EYES: Conjunctivae clear, pupils appear equal, EOM appear intact ENT: normal nose; moist mucous membranes NECK: Supple, normal ROM CARD: RRR; S1 and S2 appreciated; no murmurs, no clicks, no rubs, no gallops RESP: Normal chest excursion without splinting or tachypnea; breath sounds clear and equal bilaterally; no wheezes, no rhonchi, no rales, no hypoxia or respiratory distress, speaking full sentences ABD/GI: Normal bowel sounds; non-distended; soft, non-tender, no rebound, no guarding, no peritoneal signs, no hepatosplenomegaly BACK: The back appears normal EXT: Normal ROM in all joints; no deformity noted, no edema; no cyanosis; patient has some tenderness to palpation with minimal soft tissue swelling noted to the inner aspect of the left elbow without associated ecchymosis, redness, warmth.  There is no joint effusion noted to the left elbow and he has full range of motion.   2+ left radial pulse on exam.  Compartments in the left upper extremity are soft.  No bony deformity noted.  No calf tenderness or calf swelling. SKIN: Normal color for age and race; warm; no rash on exposed skin NEURO: Moves all extremities equally, normal sensation diffusely, cranial nerves II through XII intact, normal speech PSYCH: The patient's mood and manner are appropriate.  ____________________________________________   LABS (all labs ordered are listed, but only abnormal results are displayed)  Labs Reviewed  BASIC METABOLIC PANEL - Abnormal; Notable for the following components:      Result Value   Glucose, Bld 185 (*)    All other components within normal limits  CBC - Abnormal; Notable for the following components:   Platelets 103 (*)    All other components within normal limits  URINALYSIS, ROUTINE W REFLEX MICROSCOPIC - Abnormal; Notable for the following components:   Color, Urine STRAW (*)    APPearance CLEAR (*)    Hgb urine dipstick SMALL (*)    All other components within normal limits  TROPONIN I (HIGH SENSITIVITY)  TROPONIN I (HIGH SENSITIVITY)   ____________________________________________  EKG   EKG Interpretation  Date/Time:  Thursday November 06 2020 23:03:36 EST Ventricular Rate:  78 PR Interval:  132 QRS Duration: 90 QT Interval:  400 QTC Calculation: 456 R Axis:   112 Text Interpretation: Normal sinus rhythm Right axis deviation Possible Anterior infarct , age undetermined Abnormal ECG No significant change since last tracing Confirmed by Pryor Curia [id number]) on 11/07/2020 12:37:05 AM       ____________________________________________  RADIOLOGY Jessie Foot Kataryna Mcquilkin, personally viewed and evaluated these images (plain radiographs) as part of my medical decision making, as well as reviewing the written report by the radiologist.  ED MD interpretation: Chest x-ray and head CT showed no acute abnormality.  Official radiology report(s): DG Chest  2 View  Result Date: 11/06/2020 CLINICAL DATA:  Left arm numbness, hypertension EXAM: CHEST - 2 VIEW COMPARISON:  02/22/2020 FINDINGS: Benign calcified granuloma noted within the right upper lobe. Mild parenchymal scarring noted within the right apex.  Lungs are otherwise clear. No pneumothorax or pleural effusion. Cardiac size within normal limits. Implanted loop recorder again noted. No acute bone abnormality. IMPRESSION: No active cardiopulmonary disease. Electronically Signed   By: Fidela Salisbury MD   On: 11/06/2020 23:33   CT Head Wo Contrast  Result Date: 11/07/2020 CLINICAL DATA:  Acute neurologic deficit, hypertension, left arm paresthesia EXAM: CT HEAD WITHOUT CONTRAST TECHNIQUE: Contiguous axial images were obtained from the base of the skull through the vertex without intravenous contrast. COMPARISON:  12/20/2018 FINDINGS: Brain: Normal anatomic configuration. Parenchymal volume loss is commensurate with the patient's age. Mild periventricular white matter changes are present likely reflecting the sequela of small vessel ischemia. Remote lacunar infarcts within the right frontal subcortical white matter, right insular cortex, and right cerebellar hemisphere are again identified. No abnormal intra or extra-axial mass lesion or fluid collection. No abnormal mass effect or midline shift. No evidence of acute intracranial hemorrhage or infarct. Ventricular size is normal. Cerebellum unremarkable. Vascular: No asymmetric hyperdense vasculature at the skull base. Skull: Intact Sinuses/Orbits: Paranasal sinuses are clear. Orbits are unremarkable. Other: Mastoid air cells and middle ear cavities are clear. IMPRESSION: No acute intracranial hemorrhage or infarct. Stable remote infarcts.  Mild senescent change. Electronically Signed   By: Fidela Salisbury MD   On: 11/07/2020 00:03   US Venous Img Upper Uni Left  Result Date: 11/07/2020 CLINICAL DATA:  Pain and swelling near the elbow. EXAM: LEFT UPPER  EXTREMITY VENOUS DOPPLER ULTRASOUND TECHNIQUE: Gray-scale sonography with graded compression, as well as color Doppler and duplex ultrasound were performed to evaluate the upper extremity deep venous system from the level of the subclavian vein and including the jugular, axillary, basilic, radial, ulnar and upper cephalic vein. Spectral Doppler was utilized to evaluate flow at rest and with distal augmentation maneuvers. COMPARISON:  None. FINDINGS: Contralateral Subclavian Vein: Respiratory phasicity is normal and symmetric with the symptomatic side. No evidence of thrombus. Normal compressibility. Internal Jugular Vein: No evidence of thrombus. Normal compressibility, respiratory phasicity and response to augmentation. Subclavian Vein: No evidence of thrombus. Normal compressibility, respiratory phasicity and response to augmentation. Axillary Vein: No evidence of thrombus. Normal compressibility, respiratory phasicity and response to augmentation. Cephalic Vein: No evidence of thrombus. Normal compressibility, respiratory phasicity and response to augmentation. Basilic Vein: No evidence of thrombus. Normal compressibility, respiratory phasicity and response to augmentation. Brachial Veins: No evidence of thrombus. Normal compressibility, respiratory phasicity and response to augmentation. Radial Veins: No evidence of thrombus. Normal compressibility, respiratory phasicity and response to augmentation. Ulnar Veins: No evidence of thrombus. Normal compressibility, respiratory phasicity and response to augmentation. Other Findings:  None visualized. IMPRESSION: No evidence of DVT within the left upper extremity. Electronically Signed   By: Constance Holster M.D.   On: 11/07/2020 02:13    ____________________________________________   PROCEDURES  Procedure(s) performed (including Critical Care): none ____________________________________________   INITIAL IMPRESSION / ASSESSMENT AND PLAN / ED  COURSE  As part of my medical decision making, I reviewed the following data within the [place] notes reviewed and incorporated, Labs reviewed, EKG interpreted NSR, Radiograph reviewed and Notes from prior ED visits         Patient here with left upper extremity pain.  He denies numbness despite triage note.  Neurologically intact here.  He is hypertensive.  Currently blood pressures in the 180s/110's.  Suspect this may be causing his arm discomfort.  He reports compliance with his bisoprolol, torsemide, lisinopril.  Will give dose of  amlodipine here.  No focal neurologic deficits on exam.  Doubt stroke.  Head CT unremarkable.  His EKG is nonischemic.  His chest x-ray is clear.  His first troponin is normal.  Will check urinalysis for signs of endorgan damage given elevated blood pressures.  Discussed with patient that there is always a possibility this could be his anginal equivalent but seems less likely.  Pain is not exertional in nature and he has no chest pain or shortness of breath.  Will repeat troponin here in the ED.  He also has an area of soft tissue swelling noted to the left elbow.  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  No sign of septic arthritis, gout, bursitis, cellulitis or abscess.  Will obtain venous ultrasound just to rule out DVT.  He is comfortable with this plan.  If work-up unremarkable and blood pressure improves, anticipate discharge home with close follow-up with his PCP Dr. Glori Bickers and a prescription for amlodipine.     1:50 AM  Pt's repeat troponin is normal.  Urine shows hemoglobinuria but no hematuria no proteinuria.  No other signs of endorgan damage from hypertension.  Ultrasound currently pending.  He has received amlodipine.  I will recheck blood pressure 1 hour after receiving this medication.  2:15 AM  Pt's ultrasound shows no acute DVT.  No superficial  thrombophlebitis.  He is still hypertensive despite 5 of amlodipine.  Will give second dose and recheck blood pressure.  3:45 AM  Pt's blood pressure has improved significantly after 10 mg of amlodipine. He reports feeling better. I feel he can follow-up with his primary care doctor as an outpatient. Will discharge with amlodipine 10 mg once daily. Recommended he continue all other blood pressure medications and check his blood pressure once a day at home. He is comfortable with this plan.  At this time, I do not feel there is any life-threatening condition present. I have reviewed, interpreted and discussed all results (EKG, imaging, lab, urine as appropriate) and exam findings with patient/family. I have reviewed nursing notes and appropriate previous records.  I feel the patient is safe to be discharged home without further emergent workup and can continue workup as an outpatient as needed. Discussed usual and customary return precautions. Patient/family verbalize understanding and are comfortable with this plan.  Outpatient follow-up has been provided as needed. All questions have been answered.  ____________________________________________   FINAL CLINICAL IMPRESSION(S) / ED DIAGNOSES  Final diagnoses:  Primary hypertension  Left arm pain     ED Discharge Orders         Ordered    amLODipine (NORVASC) 10 MG tablet  Daily        11/07/20 0346          *Please note:  ARTHOR STELMA was evaluated in Emergency Department on 11/07/2020 for the symptoms described in the history of present illness. He was evaluated in the context of the global COVID-19 pandemic, which necessitated consideration that the patient might be at risk for infection with the SARS-CoV-2 virus that causes COVID-19. Institutional protocols and algorithms that pertain to the evaluation of patients at risk for COVID-19 are in a state of rapid change based on information released by regulatory bodies including the CDC and  federal and state organizations. These policies and algorithms were followed during the patient's care in the ED.  Some ED evaluations and interventions may be delayed as a  result of limited staffing during and the pandemic.*   Note:  This document was prepared using Dragon voice recognition software and may include unintentional dictation errors.   Robina Hamor, Delice Bison, DO 11/07/20 [phone]

## 2020-11-07 NOTE — Discharge Instructions (Signed)
Your cardiac labs today were normal. Chest x-ray, head CT and ultrasound of your left arm showed no acute abnormality. Please take all of your normal blood pressure medications as prescribed. We are prescribing an additional blood pressure medication to take alongside of these other medicines. I recommend that she check your blood pressure at least once a day and keep a log of this and follow-up closely with your primary care doctor to determine if you should continue the blood pressure medication that we have been prescribed today or if your regimen needs to be altered.

## 2020-11-07 NOTE — Telephone Encounter (Signed)
Left VM requesting pt to call the office back appt has already been scheduled for 11/11/20

## 2020-11-07 NOTE — ED Notes (Signed)
Pt in US

## 2020-11-07 NOTE — Telephone Encounter (Signed)
Per chart review tab pt went to [facility] ED 11/07/20; please see note from Dr Glori Bickers below the access nurse note. Sending note to [place].

## 2020-11-07 NOTE — Telephone Encounter (Signed)
[name] - Client TELEPHONE ADVICE RECORD [facility] Patient Name: Jeffrey [name] Gender: Male DOB: [birth year] Age: 80 Y 51 M 12 D Return Phone Number: [phone] (Primary) Address: City/State/Zip: [place] [address] Client [facility] - Client Client Site [place] MD Contact Type Call Who Is Calling Patient / Member / Family / Caregiver Call Type Triage / Clinical Relationship To Patient Self Return Phone Number [phone] (Primary) Chief Complaint Blood Pressure High Reason for Call Symptomatic / Request for Health Information Initial Comment emily from call center patient was seen in the hospital last night due to high blood pressure / his current pressure is 150/100 and he's been scheduled for an appt on 01/31-- but they wanted to get him triage Translation No Nurse Assessment Nurse: Vallery Sa, RN, [place] Date/Time (Eastern Time): 11/07/2020 2:16:51 PM Confirm and document reason for call. If symptomatic, describe symptoms. ---Clifton James states that his blood pressure was 150/100 about an hour ago. He developed pain in his left arm yesterday (current pain rated as a 5 on the 1 to 10 scale). No chest pain. No severe breathing difficulty. No numbness. No injury in the past 3 days. Alert and responsive. Does the patient have any new or worsening symptoms? ---Yes Will a triage be completed? ---Yes Related visit to physician within the last 2 weeks? ---Yes Does the PT have any chronic conditions? (i.e. diabetes, asthma, this includes High risk factors for pregnancy, etc.) ---Yes List chronic conditions. ---High Blood Pressure (he was seen in the ER last night and advised to follow up with MD today) Is this a behavioral health or substance abuse call? ---No Guidelines Guideline Title Affirmed Question Affirmed Notes Nurse Date/Time (Eastern Time) Blood Pressure -  High Systolic BP >= 578 OR Diastolic >= 469 Trumbull, RN, Cathy 11/07/2020 2:20:08 PM Arm Pain [1] Age > 40 AND [2] no obvious cause AND [3] pain even when not moving the arm (Exception: pain is clearly Vallery Sa, RN, [place] 11/07/2020 2:23:44 PM PLEASE NOTE: All timestamps contained within this report are represented as [place] Standard Time. CONFIDENTIALTY NOTICE: This fax transmission is intended only for the addressee. It contains information that is legally privileged, confidential or otherwise protected from use or disclosure. If you are not the intended recipient, you are strictly prohibited from reviewing, disclosing, copying using or disseminating any of this information or taking any action in reliance on or regarding this information. If you have received this fax in error, please notify us immediately by telephone so that we can arrange for its return to [place]. Phone: [phone], Toll-Free: [phone], Fax: [phone] Page: 2 of 3 Call Id: [id number] Guidelines Guideline Title Affirmed Question Affirmed Notes Nurse Date/Time Eilene Ghazi Time) made worse by moving arm or bending neck) Disp. Time Eilene Ghazi Time) Disposition Final User 11/07/2020 2:23:25 PM SEE PCP WITHIN 3 DAYS Vallery Sa RNTye [place] 11/07/2020 2:33:38 PM Called On-Call Provider Wood, RN, [facility] 11/07/2020 2:36:26 PM Call Completed Vallery Sa, RN, [place] 11/07/2020 2:29:46 PM Go to ED Now Yes Vallery Sa, RN, Rosey [place] Disagree/Comply Disagree Caller Understands Yes PreDisposition Call Doctor Care Advice Given Per Guideline SEE PCP WITHIN 3 DAYS: * You need to be seen within 2 or 3 days. HIGH BLOOD PRESSURE: * Untreated high blood pressure may cause damage to your heart, brain, kidneys, and eyes. * Treatment of high blood pressure can reduce the risk of stroke, heart attack, and heart failure. * The goal of blood  pressure treatment for most people with hypertension is to keep the blood pressure under 140/90. For people  that are 60 years or older, your doctor may instead want to keep the blood pressure under 150/90. HIGH BLOOD PRESSURE - LIFESTYLE MODIFICATIONS: * The following things can help you reduce your blood pressure. * EAT HEALTHY: Eat a diet rich in fresh fruits and vegetables, dietary fiber, non-animal protein (e.g., soy), and low-fat dairy products. Avoid foods with a high content of saturated fat or cholesterol. * DECREASE SODIUM INTAKE: Aim to eat less than 2.4 g (100 mmol) of sodium each day. Unfortunately 75% of the salt in the average person's diet is in pre-processed foods. * EXERCISE, BE MORE PHYSICALLY ACTIVE: Do at least 30 minutes of aerobic exercise (e.g., brisk walking) most days of the week. Other examples of aerobic activities cycling, jogging, and swimming. * REDUCE STRESS: Find activities that help reduce your stress. Examples might include meditation, yoga, or even a restful walk in a park. * REDUCE WEIGHT AND WAISTLINE: It is important to maintain a normal body weight. The goal should be a BMI (body mass index) under 25 for men and women, a waist circumference under 40 inches (102 cm) in men, and a waist circumference under 35 inches (88 cm) in women. * Chest pain or difficulty breathing occurs * Difficulty walking, difficulty talking, or severe headache occurs * Weakness or numbness of the face, arm or leg on one side of the body occurs CALL BACK IF: * Your blood pressure is over 180/110 * You become worse CARE ADVICE given per High Blood Pressure (Adult) guideline. GO TO ED NOW: * You need to be seen in the Emergency Department. * Go to the ED at ___________ [facility] [place] now. Drive carefully. NOTE TO TRIAGER - DRIVING: * Another adult should drive. * Patient should not delay going to the emergency department. * If immediate transportation is not available via car, rideshare (e.g., Lyft, Uber), or taxi, then the patient should be instructed to call [id number]. BRING MEDICINES: * Bring  a list of your current medicines when you go to the Emergency Department (ER). * Bring the pill bottles too. This will help the doctor (or NP/PA) to make certain you are taking the right medicines and the right dose. CARE ADVICE given per Arm Pain (Adult) guideline. Referrals GO TO FACILITY REFUSED PLEASE NOTE: All timestamps contained within this report are represented as [place] Standard Time. CONFIDENTIALTY NOTICE: This fax transmission is intended only for the addressee. It contains information that is legally privileged, confidential or otherwise protected from use or disclosure. If you are not the intended recipient, you are strictly prohibited from reviewing, disclosing, copying using or disseminating any of this information or taking any action in reliance on or regarding this information. If you have received this fax in error, please notify us immediately by telephone so that we can arrange for its return to [place]. Phone: [phone], Toll-Free: [phone], Fax: [phone] Page: 3 of 3 Call Id: [id number] [place] Phone DateTime Result/Outcome Message Type Notes Danton Clap for MD [id number] 11/07/2020 2:33:38 PM Called On Call Provider - Reached Doctor Paged Danton Clap for MD 4/76/5465 2:35:37 PM Spoke with On Call - Outcome Notification [place] declined the Go to ER disposition because he went last night and was released. Reinforced the Go to ER disposition and he states that he would like to have the MD notified so that they can review the testing that was done in the ER.  Called the office backline and [place] Mole shares that they don't have any appointments available today. She will notify MD. Mikle Bosworth notified.

## 2020-11-07 NOTE — Telephone Encounter (Signed)
I rev ER documents and results. How is he feeling? How is bp  Please schedule f/u in office next week

## 2020-11-11 ENCOUNTER — Ambulatory Visit (INDEPENDENT_AMBULATORY_CARE_PROVIDER_SITE_OTHER): Payer: PPO | Admitting: Family Medicine

## 2020-11-11 ENCOUNTER — Encounter: Payer: Self-pay | Admitting: Family Medicine

## 2020-11-11 ENCOUNTER — Other Ambulatory Visit: Payer: Self-pay

## 2020-11-11 VITALS — BP 164/90 | HR 67 | Temp 96.9°F | Ht 71.5 in | Wt 180.4 lb

## 2020-11-11 DIAGNOSIS — I5032 Chronic diastolic (congestive) heart failure: Secondary | ICD-10-CM | POA: Diagnosis not present

## 2020-11-11 DIAGNOSIS — F5102 Adjustment insomnia: Secondary | ICD-10-CM | POA: Diagnosis not present

## 2020-11-11 DIAGNOSIS — I77810 Thoracic aortic ectasia: Secondary | ICD-10-CM | POA: Diagnosis not present

## 2020-11-11 DIAGNOSIS — G4700 Insomnia, unspecified: Secondary | ICD-10-CM | POA: Insufficient documentation

## 2020-11-11 DIAGNOSIS — I10 Essential (primary) hypertension: Secondary | ICD-10-CM | POA: Diagnosis not present

## 2020-11-11 DIAGNOSIS — F430 Acute stress reaction: Secondary | ICD-10-CM | POA: Diagnosis not present

## 2020-11-11 DIAGNOSIS — D696 Thrombocytopenia, unspecified: Secondary | ICD-10-CM | POA: Diagnosis not present

## 2020-11-11 MED ORDER — HYDROXYZINE HCL 10 MG PO TABS
10.0000 mg | ORAL_TABLET | Freq: Every evening | ORAL | 1 refills | Status: DC | PRN
Start: 2020-11-11 — End: 2021-06-12

## 2020-11-11 MED ORDER — SERTRALINE HCL 25 MG PO TABS: 25.0000 mg | ORAL_TABLET | Freq: Every day | ORAL | 3 refills | Status: DC

## 2020-11-11 NOTE — Patient Instructions (Addendum)
Start sertraline (zoloft) 25 mg in evening   If any intolerable side effects -stop it and let us know  If you feel worse-also stop it    Hydroxyzine is 10-20 mg at bedtime as needed for sleep  Take care of yourself  Some light exercise will help mood The office will call you about counseling   Continue amlodipine 10 mg daily   Follow up here in about 2 weeks

## 2020-11-11 NOTE — Assessment & Plan Note (Signed)
Suspect stress related zoloft px, counseling ref done Discussed sleep hygiene   Hydroxyzine 10-20 mg qhs px  F.u 2 weeks

## 2020-11-11 NOTE — Assessment & Plan Note (Signed)
Suspect his has increased bp and worsened arm pain (which is resolved)  Reviewed stressors/ coping techniques/symptoms/ support sources/ tx options and side effects in detail today Will start zoloft 25 mg daily  Discussed expectations of SSRI medication including time to effectiveness and mechanism of action, also poss of side effects (early and late)- including mental fuzziness, weight or appetite change, nausea and poss of worse dep or anxiety (even suicidal thoughts)  Pt voiced understanding and will stop med and update if this occurs   Ref for counseling made Hydroxyzine for sleep 10-20 mg qhs prn  F/u 2 wk  No SI today

## 2020-11-11 NOTE — Assessment & Plan Note (Signed)
Continues cardiology care

## 2020-11-11 NOTE — Assessment & Plan Note (Signed)
Platelet ct 103 in ER

## 2020-11-11 NOTE — Assessment & Plan Note (Signed)
BP is not optimally controlled Reviewed hospital records, lab results and studies in detail  BP Readings from Last 3 Encounters:  11/11/20 (!) 164/90  11/07/20 (!) 173/85  08/21/20 136/78   inst pt to start back on amlodipine 10 mg daily  Continue bisoprolol 7.5 mg daily and lisinopril 40 mg daily and demadex 20 mg daily  F/u in 2 wk

## 2020-11-11 NOTE — Progress Notes (Signed)
Subjective:    Patient ID: Jeffrey [name], male    DOB: 06-03-1941, 80 y.o.   MRN: [id number]  This visit occurred during the SARS-CoV-2 public health emergency.  Safety protocols were in place, including screening questions prior to the visit, additional usage of staff PPE, and extensive cleaning of exam room while observing appropriate contact time as indicated for disinfecting solutions.    HPI Pt presents for f/u of ER visit on 1/28  He presented with L UE pain and elevated bp  Also swollen area on L elbow  bp at presentation was 197/95  Labs Results for orders placed or performed during the hospital encounter of [id number]  Basic metabolic panel  Result Value Ref Range   Sodium 141 135 - 145 mmol/L   Potassium 3.5 3.5 - 5.1 mmol/L   Chloride 100 98 - 111 mmol/L   CO2 28 22 - 32 mmol/L   Glucose, Bld 185 (H) 70 - 99 mg/dL   BUN 14 8 - 23 mg/dL   Creatinine, Ser 0.80 0.61 - 1.24 mg/dL   Calcium 9.0 8.9 - 10.3 mg/dL   GFR, Estimated >60 >60 mL/min   Anion gap 13 5 - 15  CBC  Result Value Ref Range   WBC 5.5 4.0 - 10.5 K/uL   RBC 4.75 4.22 - 5.81 MIL/uL   Hemoglobin 15.6 13.0 - 17.0 g/dL   HCT 45.6 39.0 - 52.0 %   MCV 96.0 80.0 - 100.0 fL   MCH 32.8 26.0 - 34.0 pg   MCHC 34.2 30.0 - 36.0 g/dL   RDW 12.6 11.5 - 15.5 %   Platelets 103 (L) 150 - 400 K/uL   nRBC 0.0 0.0 - 0.2 %  Urinalysis, Routine w reflex microscopic  Result Value Ref Range   Color, Urine STRAW (A) YELLOW   APPearance CLEAR (A) CLEAR   Specific Gravity, Urine 1.012 1.005 - 1.030   pH 7.0 5.0 - 8.0   Glucose, UA NEGATIVE NEGATIVE mg/dL   Hgb urine dipstick SMALL (A) NEGATIVE   Bilirubin Urine NEGATIVE NEGATIVE   Ketones, ur NEGATIVE NEGATIVE mg/dL   Protein, ur NEGATIVE NEGATIVE mg/dL   Nitrite NEGATIVE NEGATIVE   Leukocytes,Ua NEGATIVE NEGATIVE   RBC / HPF 0-5 0 - 5 RBC/hpf   WBC, UA NONE SEEN 0 - 5 WBC/hpf   Bacteria, UA NONE SEEN NONE SEEN   Squamous Epithelial / LPF 0-5 0 - 5   Mucus PRESENT    Troponin I (High Sensitivity)  Result Value Ref Range   Troponin I (High Sensitivity) 8 <18 ng/L  Troponin I (High Sensitivity)  Result Value Ref Range   Troponin I (High Sensitivity) 9 <18 ng/L   EKG showed NSR with rate of 78 and R axis deviation  cxr and CT head showed no abn DG Chest 2 View  Result Date: 11/06/2020 CLINICAL DATA:  Left arm numbness, hypertension EXAM: CHEST - 2 VIEW COMPARISON:  02/22/2020 FINDINGS: Benign calcified granuloma noted within the right upper lobe. Mild parenchymal scarring noted within the right apex. Lungs are otherwise clear. No pneumothorax or pleural effusion. Cardiac size within normal limits. Implanted loop recorder again noted. No acute bone abnormality. IMPRESSION: No active cardiopulmonary disease. Electronically Signed   By: Fidela Salisbury MD   On: 11/06/2020 23:33   CT Head Wo Contrast  Result Date: 11/07/2020 CLINICAL DATA:  Acute neurologic deficit, hypertension, left arm paresthesia EXAM: CT HEAD WITHOUT CONTRAST TECHNIQUE: Contiguous axial images were obtained from the base  of the skull through the vertex without intravenous contrast. COMPARISON:  12/20/2018 FINDINGS: Brain: Normal anatomic configuration. Parenchymal volume loss is commensurate with the patient's age. Mild periventricular white matter changes are present likely reflecting the sequela of small vessel ischemia. Remote lacunar infarcts within the right frontal subcortical white matter, right insular cortex, and right cerebellar hemisphere are again identified. No abnormal intra or extra-axial mass lesion or fluid collection. No abnormal mass effect or midline shift. No evidence of acute intracranial hemorrhage or infarct. Ventricular size is normal. Cerebellum unremarkable. Vascular: No asymmetric hyperdense vasculature at the skull base. Skull: Intact Sinuses/Orbits: Paranasal sinuses are clear. Orbits are unremarkable. Other: Mastoid air cells and middle ear cavities are clear.  IMPRESSION: No acute intracranial hemorrhage or infarct. Stable remote infarcts.  Mild senescent change. Electronically Signed   By: Fidela Salisbury MD   On: 11/07/2020 00:03   US Venous Img Upper Uni Left  Result Date: 11/07/2020 CLINICAL DATA:  Pain and swelling near the elbow. EXAM: LEFT UPPER EXTREMITY VENOUS DOPPLER ULTRASOUND TECHNIQUE: Gray-scale sonography with graded compression, as well as color Doppler and duplex ultrasound were performed to evaluate the upper extremity deep venous system from the level of the subclavian vein and including the jugular, axillary, basilic, radial, ulnar and upper cephalic vein. Spectral Doppler was utilized to evaluate flow at rest and with distal augmentation maneuvers. COMPARISON:  None. FINDINGS: Contralateral Subclavian Vein: Respiratory phasicity is normal and symmetric with the symptomatic side. No evidence of thrombus. Normal compressibility. Internal Jugular Vein: No evidence of thrombus. Normal compressibility, respiratory phasicity and response to augmentation. Subclavian Vein: No evidence of thrombus. Normal compressibility, respiratory phasicity and response to augmentation. Axillary Vein: No evidence of thrombus. Normal compressibility, respiratory phasicity and response to augmentation. Cephalic Vein: No evidence of thrombus. Normal compressibility, respiratory phasicity and response to augmentation. Basilic Vein: No evidence of thrombus. Normal compressibility, respiratory phasicity and response to augmentation. Brachial Veins: No evidence of thrombus. Normal compressibility, respiratory phasicity and response to augmentation. Radial Veins: No evidence of thrombus. Normal compressibility, respiratory phasicity and response to augmentation. Ulnar Veins: No evidence of thrombus. Normal compressibility, respiratory phasicity and response to augmentation. Other Findings:  None visualized. IMPRESSION: No evidence of DVT within the left upper extremity.  Electronically Signed   By: Constance Holster M.D.   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,customers, 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 Stress reaction 11/11/2020  . Insomnia 11/11/2020  . Heart failure with preserved ejection fraction ([place]) 03/01/2020  . CHF (congestive heart failure) ([place]) 02/22/2020  . Medicare annual wellness visit, subsequent 04/23/2019  . Paresthesia of hand 02/12/2019  . Left groin pain 02/14/2018  . 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 Cryptogenic stroke ([place]) 11/08/2016  . Blurred vision 11/05/2014  . Prostate cancer screening 11/05/2014  . Family history of colon cancer 08/01/2013  . Colon cancer screening 08/01/2013  . Routine  general medical examination at a health care facility 08/01/2013  . PSA, INCREASED 09/07/2010  . 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 Thrombocytopenia ([place]) 01/16/2009  . Hyperlipidemia 06/08/2007  . Gout 06/08/2007  . Essential hypertension 06/08/2007  . Prediabetes 06/08/2007  . SKIN CANCER, HX OF 06/08/2007   Past Medical History:  Diagnosis Date  . DDD (degenerative disc disease)    with epidural injection  . Gastritis    H pylori, partial tx.  EGD 04/2002  . Gout   . History of skin cancer   . Hyperlipidemia   . Hypertension    some white coat component- better at home  . Stroke ([place])   . TIA (transient ischemic attack)    Past Surgical History:  Procedure Laterality Date  . COLONOSCOPY WITH PROPOFOL N/A 01/16/2018   Procedure: COLONOSCOPY WITH PROPOFOL;  Surgeon: Lollie Sails, MD;  Location: [facility] ENDOSCOPY;  Service: Endoscopy;  Laterality: N/A;  . [place]  . LOOP RECORDER INSERTION N/A 12/21/2016   Procedure: Loop Recorder Insertion;  Surgeon: Deboraha Sprang, MD;  Location: [facility];  Service: Cardiovascular;  Laterality: N/A;  . TEE WITHOUT CARDIOVERSION N/A 12/21/2016   Procedure: TRANSESOPHAGEAL ECHOCARDIOGRAM (TEE);  Surgeon: Nelva Bush, MD;  Location: [facility] ORS;  Service: Cardiovascular;  Laterality: N/A;   Social History   Tobacco Use  . Smoking status: Former Smoker    Packs/day: 1.00    Years: 8.00    Pack years: 8.00    Types: Cigarettes    Quit date: 1973    Years since quitting: 49.1  . Smokeless tobacco: Never Used  Vaping Use  . Vaping Use: Never used  Substance Use Topics  . Alcohol use: Not Currently    Alcohol/week: 7.0 standard drinks    Types: 7 Glasses of wine per week    Comment: weekly-with dinner  . Drug use: No   Family History  Problem Relation Age of Onset  . Other Mother        brain tumor  . Skin cancer Mother        suspect melanoma  . Heart attack Father  55  . Colon cancer Sister    No Known Allergies Current Outpatient Medications on File Prior to Visit  Medication Sig Dispense Refill  . allopurinol (ZYLOPRIM) 300 MG tablet TAKE 1 TABLET(300 MG) BY MOUTH DAILY AT NIGHT 90 tablet 3  . amLODipine (NORVASC) 10 MG tablet Take 1 tablet (10 mg total) by mouth daily. 30 tablet 1  . Ascorbic Acid (VITAMIN C)  1000 MG tablet Take 1,000 mg by mouth daily.    Marland Kitchen aspirin EC 81 MG tablet Take 162 mg by mouth 2 (two) times daily.    Marland Kitchen atorvastatin (LIPITOR) 20 MG tablet TAKE 1 TABLET(20 MG) BY MOUTH DAILY 90 tablet 3  . bisoprolol (ZEBETA) 5 MG tablet TAKE 1 AND 1/2 TABLETS(7.5 MG) BY MOUTH EVERY DAY 135 tablet 3  . calcium carbonate (TUMS EX) 750 MG chewable tablet Chew 1 tablet by mouth every 2 (two) hours as needed for heartburn.    . Calcium-Magnesium-Zinc 1000-400-15 MG TABS Take 1 tablet by mouth daily.    Marland Kitchen lisinopril (ZESTRIL) 40 MG tablet Take 1 tablet (40 mg total) by mouth daily. 90 tablet 3  . Multiple Vitamin (MULTIVITAMIN) tablet Take 1 tablet by mouth daily.    . Omega-3 Fatty Acids (FISH OIL PO) Take 1 capsule by mouth daily.     . potassium chloride (K-DUR) 10 MEQ tablet Take 10 mEq by mouth daily as needed. for weight gain >2 lbs in 24 hr, >5 lb in 1 week, and leg swelling.    . torsemide (DEMADEX) 20 MG tablet Take 1 tablet (20 mg total) by mouth daily as needed (for weight gain >2 lbs in 24 hr, >5 lb in 1 week, and leg swelling.). 30 tablet 1  . VITAMIN E PO Take 1 capsule by mouth daily.     No current facility-administered medications on file prior to visit.    Review of Systems  Constitutional: Positive for fatigue. Negative for activity change, appetite change, fever and unexpected weight change.  HENT: Negative for congestion, rhinorrhea, sore throat and trouble swallowing.   Eyes: Negative for pain, redness, itching and visual disturbance.  Respiratory: Negative for cough, chest tightness, shortness of breath and wheezing.    Cardiovascular: Negative for chest pain and palpitations.  Gastrointestinal: Negative for abdominal pain, blood in stool, constipation, diarrhea and nausea.  Endocrine: Negative for cold intolerance, heat intolerance, polydipsia and polyuria.  Genitourinary: Negative for difficulty urinating, dysuria, frequency and urgency.  Musculoskeletal: Negative for arthralgias, joint swelling and myalgias.  Skin: Negative for pallor and rash.  Neurological: Negative for dizziness, tremors, weakness, numbness and headaches.  Hematological: Negative for adenopathy. Does not bruise/bleed easily.  Psychiatric/Behavioral: Positive for sleep disturbance. Negative for decreased concentration, dysphoric mood and suicidal ideas. The patient is nervous/anxious.        Objective:   Physical Exam Constitutional:      General: He is not in acute distress.    Appearance: Normal appearance. He is well-developed, normal weight and well-nourished.  HENT:     Head: Normocephalic and atraumatic.     Mouth/Throat:     Mouth: Oropharynx is clear and moist.  Eyes:     Extraocular Movements: EOM normal.     Conjunctiva/sclera: Conjunctivae normal.     Pupils: Pupils are equal, round, and reactive to light.  Neck:     Thyroid: No thyromegaly.     Vascular: No carotid bruit or JVD.  Cardiovascular:     Rate and Rhythm: Normal rate and regular rhythm.     Pulses: Intact distal pulses.     Heart sounds: Normal heart sounds. No gallop.   Pulmonary:     Effort: Pulmonary effort is normal. No respiratory distress.     Breath sounds: Normal breath sounds. No wheezing or rales.     Comments: No crackles Abdominal:     General: Bowel sounds are normal. There is no distension or abdominal  bruit.     Palpations: Abdomen is soft. There is no mass.     Tenderness: There is no abdominal tenderness.  Musculoskeletal:        General: No edema.     Cervical back: Normal range of motion and neck supple.  Lymphadenopathy:      Cervical: No cervical adenopathy.  Skin:    General: Skin is warm and dry.     Coloration: Skin is not pale.     Findings: No erythema or rash.  Neurological:     Mental Status: He is alert.     Sensory: No sensory deficit.     Coordination: Coordination normal.     Deep Tendon Reflexes: Reflexes are normal and symmetric. Reflexes normal.     Comments: No tremor   Psychiatric:        Mood and Affect: Mood and affect and mood normal.           Assessment & Plan:   Problem List Items Addressed This Visit      Cardiovascular and Mediastinum   Essential hypertension    BP is not optimally controlled Reviewed hospital records, lab results and studies in detail  BP Readings from Last 3 Encounters:  11/11/20 (!) 164/90  11/07/20 (!) 173/85  08/21/20 136/78   inst pt to start back on amlodipine 10 mg daily  Continue bisoprolol 7.5 mg daily and lisinopril 40 mg daily and demadex 20 mg daily  F/u in 2 wk      Ascending aorta dilation (HCC)    Continues cardiology care         Other   Thrombocytopenia (HCC)    Platelet ct 103 in ER      Stress reaction - Primary    Suspect his has increased bp and worsened arm pain (which is resolved)  Reviewed stressors/ coping techniques/symptoms/ support sources/ tx options and side effects in detail today Will start zoloft 25 mg daily  Discussed expectations of SSRI medication including time to effectiveness and mechanism of action, also poss of side effects (early and late)- including mental fuzziness, weight or appetite change, nausea and poss of worse dep or anxiety (even suicidal thoughts)  Pt voiced understanding and will stop med and update if this occurs   Ref for counseling made Hydroxyzine for sleep 10-20 mg qhs prn  F/u 2 wk  No SI today        Relevant Medications   sertraline (ZOLOFT) 25 MG tablet   hydrOXYzine (ATARAX/VISTARIL) 10 MG tablet   Other Relevant Orders   Ambulatory referral to Psychology   Insomnia     Suspect stress related zoloft px, counseling ref done Discussed sleep hygiene   Hydroxyzine 10-20 mg qhs px  F.u 2 weeks       Other Visit Diagnoses    Chronic heart failure with preserved ejection fraction (HFpEF) (HCC)   (Chronic)

## 2020-11-25 ENCOUNTER — Ambulatory Visit (INDEPENDENT_AMBULATORY_CARE_PROVIDER_SITE_OTHER): Payer: PPO | Admitting: Family Medicine

## 2020-11-25 ENCOUNTER — Encounter: Payer: Self-pay | Admitting: Family Medicine

## 2020-11-25 ENCOUNTER — Other Ambulatory Visit: Payer: Self-pay

## 2020-11-25 VITALS — BP 145/80 | HR 58 | Temp 96.9°F | Ht 71.5 in | Wt 180.6 lb

## 2020-11-25 DIAGNOSIS — I10 Essential (primary) hypertension: Secondary | ICD-10-CM

## 2020-11-25 DIAGNOSIS — F430 Acute stress reaction: Secondary | ICD-10-CM

## 2020-11-25 NOTE — Progress Notes (Signed)
Subjective:    Patient ID: Jeffrey [name], male    DOB: [birth year], 80 y.o.   MRN: [id number]  This visit occurred during the SARS-CoV-2 public health emergency.  Safety protocols were in place, including screening questions prior to the visit, additional usage of staff PPE, and extensive cleaning of exam room while observing appropriate contact time as indicated for disinfecting solutions.    HPI Pt presents for f/u of stress reaction and HTN  Wt Readings from Last 3 Encounters:  11/25/20 180 lb 9 oz (81.9 kg)  11/11/20 180 lb 7 oz (81.8 kg)  11/06/20 178 lb (80.7 kg)   24.83 kg/m  Last visit - amlodipine was re started at 10 mg daily  Takes bisoprolol 7.5 mg daily and lisinopril 40 mg daily  BP Readings from Last 3 Encounters:  11/25/20 (!) 145/80  11/11/20 (!) 164/90  11/07/20 (!) 173/85    Pulse Readings from Last 3 Encounters:  11/25/20 (!) 58  11/11/20 67  11/07/20 (!) 59   Has cardiology appt on 2/24  Dr End  bp is still up -not a lot of change  Not checking that often   Lab Results  Component Value Date   CREATININE 0.80 11/06/2020   BUN 14 11/06/2020   NA 141 11/06/2020   K 3.5 11/06/2020   CL 100 11/06/2020   CO2 28 11/06/2020    Stress reaction  Started zoloft 25 mg daily at last visit (made him dizzy so he had to cut it in 1/2) (tolerates that)  Mood is more calm  Stress is also down a little bit -forsees it to continue to change   Hydroxyzine 10-20 mg qhs for sleep prn It helped in the beginning  Now works about 50 % of the time - not dizzy from that (one)  When he is able to sleep- gets up to urinate a few times pre night  Also ref to counseling   Patient Active Problem List   Diagnosis Date Noted  . Stress reaction 11/11/2020  . Insomnia 11/11/2020  . Heart failure with preserved ejection fraction ([place]) 03/01/2020  . CHF (congestive heart failure) ([place]) 02/22/2020  . Medicare annual wellness visit, subsequent 04/23/2019  . Paresthesia  of hand 02/12/2019  . Left groin pain 02/14/2018  . 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 Cryptogenic stroke ([place]) 11/08/2016  . Blurred vision 11/05/2014  . Prostate cancer screening 11/05/2014  . Family history of colon cancer 08/01/2013  . Colon cancer screening 08/01/2013  . Routine general medical examination at a health care facility 08/01/2013  . PSA, INCREASED 09/07/2010  . 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 Thrombocytopenia ([place]) 01/16/2009  . Hyperlipidemia 06/08/2007  . Gout 06/08/2007  . Essential hypertension 06/08/2007  . Prediabetes 06/08/2007  . SKIN CANCER, HX OF 06/08/2007   Past Medical History:  Diagnosis Date  . DDD (degenerative disc disease)    with epidural injection  . Gastritis    H pylori, partial tx.  EGD 04/2002  . Gout   . History of skin cancer   . Hyperlipidemia   . Hypertension    some white coat component- better at home  . Stroke ([place])   . TIA (transient ischemic attack)    Past Surgical History:  Procedure Laterality Date  . COLONOSCOPY WITH PROPOFOL N/A 01/16/2018   Procedure: COLONOSCOPY WITH PROPOFOL;  Surgeon: Lollie Sails, MD;  Location[facility] END[facility]COPY;  Service: Endoscopy;  Laterality: N/A;  .  [place]  . LOOP RECORDER INSERTION N/A 12/21/2016   Procedure: Loop Recorder Insertion;  Surgeon: Deboraha Sprang, MD;  Location: [facility] LAB;  Service: Cardiovascular;  Laterality: N/A;  . TEE WITHOUT CARDIOVERSION N/A 12/21/2016   Procedure: TRANSESOPHAGEAL ECHOCARDIOGRAM (TEE);  Surgeon: Nelva Bush, MD;  Location: [facility] ORS;  Service: Cardiovascular;  Laterality: N/A;   Social History   Tobacco Use  . Smoking status: Former Smoker    Packs/day: 1.00    Years: 8.00    Pack years: 8.00    Types: Cigarettes    Quit date: 1973    Years since quitting: 49.1  . Smokeless tobacco: Never Used  Vaping Use  . Vaping Use: Never used  Substance Use Topics  . Alcohol  use: Not Currently    Alcohol/week: 7.0 standard drinks    Types: 7 Glasses of wine per week    Comment: weekly-with dinner  . Drug use: No   Family History  Problem Relation Age of Onset  . Other Mother        brain tumor  . Skin cancer Mother        suspect melanoma  . Heart attack Father 31  . Colon cancer Sister    No Known Allergies Current Outpatient Medications on File Prior to Visit  Medication Sig Dispense Refill  . allopurinol (ZYLOPRIM) 300 MG tablet TAKE 1 TABLET(300 MG) BY MOUTH DAILY AT NIGHT 90 tablet 3  . amLODipine (NORVASC) 10 MG tablet Take 1 tablet (10 mg total) by mouth daily. 30 tablet 1  . Ascorbic Acid (VITAMIN C) 1000 MG tablet Take 1,000 mg by mouth daily.    Marland Kitchen aspirin EC 81 MG tablet Take 162 mg by mouth 2 (two) times daily.    Marland Kitchen atorvastatin (LIPITOR) 20 MG tablet TAKE 1 TABLET(20 MG) BY MOUTH DAILY 90 tablet 3  . bisoprolol (ZEBETA) 5 MG tablet TAKE 1 AND 1/2 TABLETS(7.5 MG) BY MOUTH EVERY DAY 135 tablet 3  . calcium carbonate (TUMS EX) 750 MG chewable tablet Chew 1 tablet by mouth every 2 (two) hours as needed for heartburn.    . Calcium-Magnesium-Zinc 1000-400-15 MG TABS Take 1 tablet by mouth daily.    . hydrOXYzine (ATARAX/VISTARIL) 10 MG tablet Take 1-2 tablets (10-20 mg total) by mouth at bedtime as needed for anxiety. 60 tablet 1  . lisinopril (ZESTRIL) 40 MG tablet Take 1 tablet (40 mg total) by mouth daily. 90 tablet 3  . Multiple Vitamin (MULTIVITAMIN) tablet Take 1 tablet by mouth daily.    . Omega-3 Fatty Acids (FISH OIL PO) Take 1 capsule by mouth daily.     . potassium chloride (K-DUR) 10 MEQ tablet Take 10 mEq by mouth daily as needed. for weight gain >2 lbs in 24 hr, >5 lb in 1 week, and leg swelling.    . sertraline (ZOLOFT) 25 MG tablet Take 1 tablet (25 mg total) by mouth daily. In evening 30 tablet 3  . torsemide (DEMADEX) 20 MG tablet Take 1 tablet (20 mg total) by mouth daily as needed (for weight gain >2 lbs in 24 hr, >5 lb in 1  week, and leg swelling.). 30 tablet 1  . VITAMIN E PO Take 1 capsule by mouth daily.     No current facility-administered medications on file prior to visit.    Review of Systems  Constitutional: Negative for activity change, appetite change, fatigue, fever and unexpected weight change.  HENT: Negative for congestion, rhinorrhea, sore throat  and trouble swallowing.   Eyes: Negative for pain, redness, itching and visual disturbance.  Respiratory: Negative for cough, chest tightness, shortness of breath and wheezing.   Cardiovascular: Negative for chest pain and palpitations.  Gastrointestinal: Negative for abdominal pain, blood in stool, constipation, diarrhea and nausea.  Endocrine: Negative for cold intolerance, heat intolerance, polydipsia and polyuria.  Genitourinary: Negative for difficulty urinating, dysuria, frequency and urgency.  Musculoskeletal: Negative for arthralgias, joint swelling and myalgias.  Skin: Negative for pallor and rash.  Neurological: Negative for dizziness, tremors, weakness, numbness and headaches.  Hematological: Negative for adenopathy. Does not bruise/bleed easily.  Psychiatric/Behavioral: Positive for sleep disturbance. Negative for decreased concentration, dysphoric mood and suicidal ideas. The patient is nervous/anxious.        Objective:   Physical Exam Constitutional:      General: He is not in acute distress.    Appearance: Normal appearance. He is well-developed, normal weight and well-nourished. He is not ill-appearing or diaphoretic.  HENT:     Head: Normocephalic and atraumatic.     Mouth/Throat:     Mouth: Oropharynx is clear and moist.  Eyes:     Extraocular Movements: EOM normal.     Conjunctiva/sclera: Conjunctivae normal.     Pupils: Pupils are equal, round, and reactive to light.  Neck:     Thyroid: No thyromegaly.     Vascular: No carotid bruit or JVD.  Cardiovascular:     Rate and Rhythm: Normal rate and regular rhythm.      Pulses: Intact distal pulses.     Heart sounds: Normal heart sounds. No gallop.   Pulmonary:     Effort: Pulmonary effort is normal. No respiratory distress.     Breath sounds: Normal breath sounds. No wheezing or rales.     Comments: No crackles Abdominal:     General: Abdomen is flat. Bowel sounds are normal. There is no distension or abdominal bruit.     Palpations: Abdomen is soft.  Musculoskeletal:        General: No edema.     Cervical back: Normal range of motion and neck supple.     Right lower leg: No edema.     Left lower leg: No edema.  Lymphadenopathy:     Cervical: No cervical adenopathy.  Skin:    General: Skin is warm and dry.     Findings: No rash.  Neurological:     Mental Status: He is alert.     Cranial Nerves: No cranial nerve deficit.     Motor: No weakness.     Coordination: Coordination normal.     Deep Tendon Reflexes: Reflexes are normal and symmetric. Reflexes normal.  Psychiatric:        Mood and Affect: Mood and affect and mood normal.           Assessment & Plan:   Problem List Items Addressed This Visit      Cardiovascular and Mediastinum   Essential hypertension - Primary    bp is improved with addition of amlodipine 10 mg  Tolerating well  BP: (!) 145/80    Plan to continue this with his bisoprolol 7.5 mg daily and lisinopril 40 mg daily  F/u with cardiology as planned 2/24 to discuss further         Other   Stress reaction    Doing slightly better  Stress is down  Very low dose sertraline 12.5 mg daily seems to help as well (has option to try 25 again  but if made him dizzy) He would rather not change medicines right now Counseling ref still stands  Hydroxyzine helps sleep half the time- aware he can try 20 mg instead of 10 as needed (caution of sedation/falls)  Will continue to monitor

## 2020-11-25 NOTE — Patient Instructions (Addendum)
Stay on the 1/2 of the sertraline  If you want to increase to 1 again - let us know how it goes (you don't have to)  For sleep -take 1-2 of the hydroxyzine as needed (don't have to take it every night)   Take care of yourself   Blood pressure is improved- continue your current medicines and follow up with the cardiologist

## 2020-11-25 NOTE — Assessment & Plan Note (Signed)
bp is improved with addition of amlodipine 10 mg  Tolerating well  BP: (!) 145/80    Plan to continue this with his bisoprolol 7.5 mg daily and lisinopril 40 mg daily  F/u with cardiology as planned 2/24 to discuss further

## 2020-11-25 NOTE — Assessment & Plan Note (Signed)
Doing slightly better  Stress is down  Very low dose sertraline 12.5 mg daily seems to help as well (has option to try 25 again but if made him dizzy) He would rather not change medicines right now Counseling ref still stands  Hydroxyzine helps sleep half the time- aware he can try 20 mg instead of 10 as needed (caution of sedation/falls)  Will continue to monitor

## 2020-12-04 ENCOUNTER — Other Ambulatory Visit: Payer: Self-pay

## 2020-12-04 ENCOUNTER — Ambulatory Visit: Payer: PPO | Admitting: Internal Medicine

## 2020-12-04 ENCOUNTER — Encounter: Payer: Self-pay | Admitting: Internal Medicine

## 2020-12-04 ENCOUNTER — Other Ambulatory Visit
Admission: RE | Admit: 2020-12-04 | Discharge: 2020-12-04 | Disposition: A | Discharge status: Home / Self Care | Payer: PPO | Source: Ambulatory Visit | Attending: Internal Medicine | Admitting: Internal Medicine

## 2020-12-04 VITALS — BP 160/100 | HR 58 | Ht 73.0 in | Wt 186.0 lb

## 2020-12-04 DIAGNOSIS — I10 Essential (primary) hypertension: Secondary | ICD-10-CM | POA: Diagnosis not present

## 2020-12-04 DIAGNOSIS — Z8673 Personal history of transient ischemic attack (TIA), and cerebral infarction without residual deficits: Secondary | ICD-10-CM | POA: Diagnosis not present

## 2020-12-04 DIAGNOSIS — M79602 Pain in left arm: Secondary | ICD-10-CM | POA: Diagnosis not present

## 2020-12-04 NOTE — Patient Instructions (Addendum)
Medication Instructions:  Your physician has recommended you make the following change in your medication:  1- START Hydrochlorothiazide 25 mg by mouth once a day.  *If you need a refill on your cardiac medications before your next appointment, please call your pharmacy*  Lab Work: Your physician recommends that you return for lab work in: at your earliest convenience to check your urine. - Serum aldosterone, renin activity ratio. - Please go to the [facility]. You will check in at the front desk to the right as you walk into the atrium. Valet Parking is offered if needed. - No appointment needed. You may go any day between 7 am and 6 pm.  If you have labs (blood work) drawn today and your tests are completely normal, you will receive your results only by: Marland Kitchen MyChart Message (if you have MyChart) OR . A paper copy in the mail If you have any lab test that is abnormal or we need to change your treatment, we will call you to review the results.  Testing/Procedures: Your physician has requested that you have a renal artery duplex. During this test, an ultrasound is used to evaluate blood flow to the kidneys. Allow one hour for this exam. Do not eat after midnight the day before and avoid carbonated beverages. Take your medications as you usually do.   No food after 11PM the night before.  Water is OK. (Don't drink liquids if you have been instructed not to for ANOTHER test).  Take two Extra-Strength Gas-X capsules at bedtime the night before test.   Take an additional two Extra-Strength Gas-X capsules three (3) hours before the test or first thing in the morning.    Avoid foods that produce bowel gas, for 24 hours prior to exam (see below).    No breakfast, no chewing gum, no smoking or carbonated beverages.  Patient may take morning medications with water.  Come in for test at least 15 minutes early to register.    Follow-Up: At [facility], you and your health needs are  our priority.  As part of our continuing mission to provide you with exceptional heart care, we have created designated Provider Care Teams.  These Care Teams include your primary Cardiologist (physician) and Advanced Practice Providers (APPs -  Physician Assistants and Nurse Practitioners) who all work together to provide you with the care you need, when you need it.  We recommend signing up for the patient portal called "MyChart".  Sign up information is provided on this After Visit Summary.  MyChart is used to connect with patients for Virtual Visits (Telemedicine).  Patients are able to view lab/test results, encounter notes, upcoming appointments, etc.  Non-urgent messages can be sent to your provider as well.   To learn more about what you can do with MyChart, go to [email or web address].    Your next appointment:   1 month(s) with an APP  The format for your next appointment:   In Person  Provider:   You will see one of the following Advanced Practice Providers on your designated Care Team:    Murray Hodgkins, NP  Christell Faith, PA-C  Marrianne Mood, PA-C  Cadence Pught[place], [place]  Laurann Montana, NP

## 2020-12-04 NOTE — Progress Notes (Unsigned)
Follow-up Outpatient Visit Date: 12/04/2020  Primary Care Provider: Abner Greenspan, MD [place] [address]  Chief Complaint: Follow-up hypertension  HPI:  Jeffrey [name] is a 80 y.o. male with history of stroke, hypertension, hyperlipidemia, and HFpEF, who presents for follow-up of hypertension, stroke, and HFpEF.  I last saw Jeffrey [name] in 08/2020, at which time he was feeling well.  His blood pressures were somewhat labile and elevated at times.  He was under quite a bit of stress at work.  No medication changes were made.  Today, Jeffrey [name] reports that he has continued to be under quite a bit of stress through work.  He has gone for extended stretches with minimal sleep and felt very poorly.  This led to an episode of left arm pain last month.  He ultimately proceeded to the ED out of concern that this could be an anginal equivalent, as his father had also complained of left arm pain leading up to an MI several years ago.  ED evaluation was unrevealing other than poorly controlled blood pressure.  He was started on amlodipine 10 mg daily, which she has been tolerating well.  He has not had any chest pain, shortness of breath, palpitations, lightheadedness, or edema.  He has not needed to use the as needed torsemide.  He notes that his home blood pressure continues to be elevated.  --------------------------------------------------------------------------------------------------  Cardiovascular History & Procedures: Cardiovascular Problems:  Cryptogenic stroke  Mildly dilated ascending aorta  Risk Factors:  Hypertension, hyperlipidemia, male gender, and age greater than 18  Cath/PCI:  None  CV Surgery:  None  EP Procedures and Devices:  Implantable loop recorder (12/21/16)  24-hour Holter monitor (10/20/16, [place]):Predominant rhythm was sinus with an average rate of 63 bpm (range 46-93 bpm). Rare supraventricular and ventricular ectopy was identified.  There was one episode of nonsustained ventricular tachycardia lasting 4 beats. No sustained arrhythmias, including atrial fibrillation/flutter) are evident.  Non-Invasive Evaluation(s):  Exercise tolerance test (03/27/2020): Low risk study without evidence of ischemia.  Rare isolated PVCs noted before and during stress.  TTE (02/05/2020, [facility]): Normal LV size with mild LVH. LVEF 55-60% with grade 2 diastolic dysfunction and elevated filling pressures. Normal RV size and function. Moderate left and mild right atrial enlargement. Mild to moderate mitral regurgitation. Mild tricuspid regurgitation. Aortic sclerosis. 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   TEE (12/21/16): Normal LV size with mild LVH. LVEF 55-65%. Mild aortic regurgitation. Mildly dilated ascending aorta. Mild mitral regurgitation. No left or right atrial thrombus. No PFO or ASD; negative  TTE (12/02/16): Normal LV size with mild LVH. LVEF 55-60% with normal wall motion. Normal diastolic function. Moderate AI. Mildly dilated ascending aorta measuring 3.9 cm. Mild MR. Normal RV size and function.  Transthoracic echocardiogram (10/2016; [place]): Reportedly normal, though no images or reports is available for review.   Recent CV Pertinent Labs: Lab Results  Component Value Date   CHOL 128 08/18/2020   HDL 41.40 08/18/2020   LDLCALC 25 01/10/2017   LDLDIRECT 49.0 08/18/2020   TRIG 262.0 (H) 08/18/2020   CHOLHDL 3 08/18/2020   INR 1.1 12/20/2018   K 3.5 11/06/2020   K 3.9 05/15/2009   BUN 14 11/06/2020   BUN 12 05/15/2009   CREATININE 0.80 11/06/2020   CREATININE 0.8 05/15/2009    Past medical and surgical history were reviewed and updated in EPIC.  Current Meds  Medication Sig  . allopurinol (ZYLOPRIM) 300  MG tablet TAKE 1 TABLET(300 MG) BY MOUTH DAILY AT NIGHT  . amLODipine (NORVASC) 10 MG tablet Take 1 tablet (10 mg total) by mouth daily.  . Ascorbic Acid (VITAMIN  C) 1000 MG tablet Take 1,000 mg by mouth daily.  Marland Kitchen aspirin EC 81 MG tablet Take 162 mg by mouth 2 (two) times daily.  Marland Kitchen atorvastatin (LIPITOR) 20 MG tablet TAKE 1 TABLET(20 MG) BY MOUTH DAILY  . bisoprolol (ZEBETA) 5 MG tablet TAKE 1 AND 1/2 TABLETS(7.5 MG) BY MOUTH EVERY DAY  . calcium carbonate (TUMS EX) 750 MG chewable tablet Chew 1 tablet by mouth every 2 (two) hours as needed for heartburn.  . Calcium-Magnesium-Zinc 1000-400-15 MG TABS Take 1 tablet by mouth daily.  . hydrOXYzine (ATARAX/VISTARIL) 10 MG tablet Take 1-2 tablets (10-20 mg total) by mouth at bedtime as needed for anxiety.  Marland Kitchen lisinopril (ZESTRIL) 40 MG tablet Take 1 tablet (40 mg total) by mouth daily.  . Multiple Vitamin (MULTIVITAMIN) tablet Take 1 tablet by mouth daily.  . Omega-3 Fatty Acids (FISH OIL PO) Take 1 capsule by mouth daily.   . potassium chloride (K-DUR) 10 MEQ tablet Take 10 mEq by mouth daily as needed. for weight gain >2 lbs in 24 hr, >5 lb in 1 week, and leg swelling.  . sertraline (ZOLOFT) 25 MG tablet Take 1 tablet (25 mg total) by mouth daily. In evening  . torsemide (DEMADEX) 20 MG tablet Take 1 tablet (20 mg total) by mouth daily as needed (for weight gain >2 lbs in 24 hr, >5 lb in 1 week, and leg swelling.).  Marland Kitchen VITAMIN E PO Take 1 capsule by mouth daily.    Allergies: Patient has no known allergies.  Social History   Tobacco Use  . Smoking status: Former Smoker    Packs/day: 1.00    Years: 8.00    Pack years: 8.00    Types: Cigarettes    Quit date: 1973    Years since quitting: 49.1  . Smokeless tobacco: Never Used  Vaping Use  . Vaping Use: Never used  Substance Use Topics  . Alcohol use: Not Currently    Alcohol/week: 7.0 standard drinks    Types: 7 Glasses of wine per week    Comment: weekly-with dinner  . Drug use: No    Family History  Problem Relation Age of Onset  . Other Mother        brain tumor  . Skin cancer Mother        suspect melanoma  . Heart attack Father 19  .  Colon cancer Sister     Review of Systems: A 12-system review of systems was performed and was negative except as noted in the HPI.  --------------------------------------------------------------------------------------------------  Physical Exam: BP (!) 160/100 (BP Location: Left Arm, Patient Position: Sitting, Cuff Size: Normal)   Pulse (!) 58   Ht 6\' 1"  (1.854 m)   Wt 186 lb (84.4 kg)   SpO2 96%   BMI 24.54 kg/m   General:  NAD. Neck: No JVD or HJR. Lungs: Clear to auscultation bilaterally without wheezes or crackles. Heart: Regular rate and rhythm without murmurs, rubs, or gallops. Abdomen: Soft, nontender, nondistended. Extremities: No lower extremity edema.   Lab Results  Component Value Date   WBC 5.5 11/06/2020   HGB 15.6 11/06/2020   HCT 45.6 11/06/2020   MCV 96.0 11/06/2020   PLT 103 (L) 11/06/2020    Lab Results  Component Value Date   NA 141 11/06/2020  K 3.5 11/06/2020   CL 100 11/06/2020   CO2 28 11/06/2020   BUN 14 11/06/2020   CREATININE 0.80 11/06/2020   GLUCOSE 185 (H) 11/06/2020   ALT 52 08/18/2020    Lab Results  Component Value Date   CHOL 128 08/18/2020   HDL 41.40 08/18/2020   LDLCALC 25 01/10/2017   LDLDIRECT 49.0 08/18/2020   TRIG 262.0 (H) 08/18/2020   CHOLHDL 3 08/18/2020    --------------------------------------------------------------------------------------------------  ASSESSMENT AND PLAN: Uncontrolled hypertension: Blood pressure remains poorly controlled despite attempts at lifestyle modifications as well as recent addition of amlodipine.  It is possible that stress is contributing, though we must be concerned for potential secondary causes given difficult to control BP.  We will refer Jeffrey [name] for renal artery Doppler ultrasound as well as a serum aldosterone:plasma renin activity ratio.  We will continue his current antihypertensive regimen and also add HCTZ 25 mg daily.  Jeffrey [name] should return in 1 month for BMP and  blood pressure reevaluation.  Left arm pain: Pain persisted for about 2 days and was without any other symptoms.  No trauma was reported.  Work-up in the ED was unremarkable laying including negative troponin and venous duplex of the left arm without DVT.  Symptoms have since resolved.  I have a low suspicion that this reflects an anginal equivalent.  Of note, exercise tolerance test last summer was low risk without evidence of ischemia.  We will defer further work-up at this time, though if Jeffrey [name] has recurrent left arm pain or other symptoms more convincing for angina, we may need to consider further evaluation for underlying ischemic heart disease.  History of stroke: No new symptoms reported.  Most recent ILR interrogation in 06/2020 showed no episodes of atrial fibrillation.  Continue aspirin and statin therapy.  Optimize blood pressure control, as above.  Follow-up: Return to clinic in 1 month.  Nelva Bush, MD 12/05/2020 12:07 PM

## 2020-12-05 ENCOUNTER — Encounter: Payer: Self-pay | Admitting: Internal Medicine

## 2020-12-05 DIAGNOSIS — M79602 Pain in left arm: Secondary | ICD-10-CM | POA: Insufficient documentation

## 2020-12-05 DIAGNOSIS — Z8673 Personal history of transient ischemic attack (TIA), and cerebral infarction without residual deficits: Secondary | ICD-10-CM | POA: Insufficient documentation

## 2020-12-09 ENCOUNTER — Telehealth: Payer: Self-pay | Admitting: Internal Medicine

## 2020-12-09 MED ORDER — HYDROCHLOROTHIAZIDE 25 MG PO TABS: 25.0000 mg | ORAL_TABLET | Freq: Every day | ORAL | 1 refills | Status: DC

## 2020-12-09 NOTE — Telephone Encounter (Signed)
Patient calling  Patient was prescribed hydrochlorothiazide 25 MG at last visit but prescription was never sent to pharmacy  Please review and send to [facility] on [address].

## 2020-12-09 NOTE — Telephone Encounter (Signed)
Rx sent to pharmacy and patient notified.  He was appreciative.

## 2020-12-11 LAB — ALDOSTERONE + RENIN ACTIVITY W/ RATIO
ALDO / PRA Ratio: >25.1 (ref 0.0–30.0)
Aldosterone: 4.2 ng/dL (ref 0.0–30.0)
PRA LC/MS/MS: <0.167 ng/mL/hr — ABNORMAL LOW (ref 0.167–5.380)

## 2020-12-24 ENCOUNTER — Ambulatory Visit (INDEPENDENT_AMBULATORY_CARE_PROVIDER_SITE_OTHER): Payer: PPO

## 2020-12-24 ENCOUNTER — Other Ambulatory Visit: Payer: Self-pay

## 2020-12-24 DIAGNOSIS — I10 Essential (primary) hypertension: Secondary | ICD-10-CM

## 2020-12-30 ENCOUNTER — Telehealth: Payer: Self-pay | Admitting: Family Medicine

## 2020-12-30 DIAGNOSIS — I10 Essential (primary) hypertension: Secondary | ICD-10-CM

## 2020-12-30 NOTE — Telephone Encounter (Signed)
-----   Message from Ellamae Sia sent at 12/15/2020  3:00 PM EST ----- Regarding: Lab orders for Wednesday, 3.23.22 Lab orders for f/u

## 2020-12-31 ENCOUNTER — Other Ambulatory Visit (INDEPENDENT_AMBULATORY_CARE_PROVIDER_SITE_OTHER): Payer: PPO

## 2020-12-31 ENCOUNTER — Other Ambulatory Visit: Payer: Self-pay

## 2020-12-31 DIAGNOSIS — I10 Essential (primary) hypertension: Secondary | ICD-10-CM

## 2020-12-31 LAB — BASIC METABOLIC PANEL
BUN: 15 mg/dL (ref 6–23)
CO2: 28 mEq/L (ref 19–32)
Calcium: 8.9 mg/dL (ref 8.4–10.5)
Chloride: 104 mEq/L (ref 96–112)
Creatinine, Ser: 0.73 mg/dL (ref 0.40–1.50)
GFR: 87.65 mL/min (ref >60.00)
Glucose, Bld: 151 mg/dL — ABNORMAL HIGH (ref 70–99)
Potassium: 3.9 mEq/L (ref 3.5–5.1)
Sodium: 139 mEq/L (ref 135–145)

## 2020-12-31 LAB — CBC WITH DIFFERENTIAL/PLATELET
Basophils Absolute: 0 10*3/uL (ref 0.0–0.1)
Basophils Relative: 0.5 % (ref 0.0–3.0)
Eosinophils Absolute: 0.4 10*3/uL (ref 0.0–0.7)
Eosinophils Relative: 6.6 % — ABNORMAL HIGH (ref 0.0–5.0)
HCT: 40.4 % (ref 39.0–52.0)
Hemoglobin: 14 g/dL (ref 13.0–17.0)
Lymphocytes Relative: 17.4 % (ref 12.0–46.0)
Lymphs Abs: 1.1 10*3/uL (ref 0.7–4.0)
MCHC: 34.6 g/dL (ref 30.0–36.0)
MCV: 93.5 fl (ref 78.0–100.0)
Monocytes Absolute: 0.6 10*3/uL (ref 0.1–1.0)
Monocytes Relative: 8.6 % (ref 3.0–12.0)
Neutro Abs: 4.4 10*3/uL (ref 1.4–7.7)
Neutrophils Relative %: 66.9 % (ref 43.0–77.0)
Platelets: 118 10*3/uL — ABNORMAL LOW (ref 150.0–400.0)
RBC: 4.32 Mil/uL (ref 4.22–5.81)
RDW: 12.6 % (ref 11.5–15.5)
WBC: 6.6 10*3/uL (ref 4.0–10.5)

## 2021-01-01 NOTE — Progress Notes (Signed)
Office Visit    Patient Name: Jeffrey [name] Date of Encounter: 01/02/2021  PCP:  Abner Greenspan, MD   [place]  Cardiologist:  Nelva Bush, MD  Advanced Practice Provider:  No care team member to display Electrophysiologist:  None    Chief Noel is a 80 y.o. male with a hx of hypertension, hyperlipidemia, HFpEF presents today for hypertension follow-up  Past Medical History    Past Medical History:  Diagnosis Date  . DDD (degenerative disc disease)    with epidural injection  . Gastritis    H pylori, partial tx.  EGD 04/2002  . Gout   . History of skin cancer   . Hyperlipidemia   . Hypertension    some white coat component- better at home  . Stroke ([place])   . TIA (transient ischemic attack)    Past Surgical History:  Procedure Laterality Date  . COLONOSCOPY WITH PROPOFOL N/A 01/16/2018   Procedure: COLONOSCOPY WITH PROPOFOL;  Surgeon: Lollie Sails, MD;  Location: [facility] ENDOSCOPY;  Service: Endoscopy;  Laterality: N/A;  . [place]  . LOOP RECORDER INSERTION N/A 12/21/2016   Procedure: Loop Recorder Insertion;  Surgeon: Deboraha Sprang, MD;  Location: [facility];  Service: Cardiovascular;  Laterality: N/A;  . TEE WITHOUT CARDIOVERSION N/A 12/21/2016   Procedure: TRANSESOPHAGEAL ECHOCARDIOGRAM (TEE);  Surgeon: Nelva Bush, MD;  Location: [facility] ORS;  Service: Cardiovascular;  Laterality: N/A;    Allergies  No Known Allergies  History of Present Illness    Jeffrey [name] is a 80 y.o. male with a hx of hypertension, CVA, hyperlipidemia, HFpEF last seen 12/04/2020.  Episode of left arm pain in January seen in the ED.  His father had complained of left arm pain leading up to an MI several years ago.  ED evaluation unrevealing other than poorly controlled blood pressure.  Amlodipine 10 mg daily was started.  He was seen 12/04/2020 noting lots of stress through work.  No recurrent arm pain and  no anginal symptoms with no recommendation for ischemic evaluation.  His blood pressure was still elevated and he was started on hydrochlorothiazide 25 mg daily.  Presents today for follow-up.  Tells me his blood pressure daily at home with readings routinely less than 130/80. Reports no shortness of breath nor dyspnea on exertion. Reports no chest pain, pressure, or tightness.  He has had no recurrent arm pain.  No edema, orthopnea, PND. Reports no palpitations.  Endorses work is still somewhat stressful but overall improving.  We reviewed his recent lab work at PCP 12/31/2020 that showed normal renal function and potassium.  He wonders whether he should be worried about his mildly elevated blood sugar and we reviewed his A1c from November of 6.1 which was reassuring.   EKGs/Labs/Other Studies Reviewed:   The following studies were reviewed today:  EP Procedures and Devices:  Implantable loop recorder (12/21/16)  24-hour Holter monitor (10/20/16, [place]): Predominant rhythm was sinus with an average rate of 63 bpm (range 46-93 bpm). Rare supraventricular and ventricular ectopy was identified. There was one episode of nonsustained ventricular tachycardia lasting 4 beats. No sustained arrhythmias, including atrial fibrillation/flutter) are evident.   Non-Invasive Evaluation(s):  Exercise tolerance test (03/27/2020): Low risk study without evidence of ischemia.  Rare isolated PVCs noted before and during stress.  TTE (02/05/2020, [facility]): Normal LV size with mild LVH.  LVEF 55-60% with grade 2  diastolic dysfunction and elevated filling pressures.  Normal RV size and function.  Moderate left and mild right atrial enlargement.  Mild to moderate mitral regurgitation.  Mild tricuspid regurgitation.  Aortic sclerosis.  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   TEE (12/21/16): Normal LV size with mild LVH. LVEF 55-65%. Mild aortic regurgitation. Mildly dilated  ascending aorta. Mild mitral regurgitation. No left or right atrial thrombus. No PFO or ASD; negative  TTE (12/02/16): Normal LV size with mild LVH. LVEF 55-60% with normal wall motion. Normal diastolic function. Moderate AI. Mildly dilated ascending aorta measuring 3.9 cm. Mild MR. Normal RV size and function.  Transthoracic echocardiogram (10/2016; [place]): Reportedly normal, though no images or reports is available for review.  EKG: No EKG today  Recent Labs: 08/18/2020: ALT 52; TSH 2.19 12/31/2020: BUN 15; Creatinine, Ser 0.73; Hemoglobin 14.0; Platelets 118.0; Potassium 3.9; Sodium 139  Recent Lipid Panel    Component Value Date/Time   CHOL 128 08/18/2020 0824   TRIG 262.0 (H) 08/18/2020 0824   HDL 41.40 08/18/2020 0824   CHOLHDL 3 08/18/2020 0824   VLDL 52.4 (H) 08/18/2020 0824   LDLCALC 25 01/10/2017 0801   LDLDIRECT 49.0 08/18/2020 0824   Home Medications   Current Meds  Medication Sig  . allopurinol (ZYLOPRIM) 300 MG tablet TAKE 1 TABLET(300 MG) BY MOUTH DAILY AT NIGHT  . amLODipine (NORVASC) 10 MG tablet Take 1 tablet (10 mg total) by mouth daily.  . Ascorbic Acid (VITAMIN C) 1000 MG tablet Take 1,000 mg by mouth daily.  Marland Kitchen aspirin EC 81 MG tablet Take 162 mg by mouth 2 (two) times daily.  Marland Kitchen atorvastatin (LIPITOR) 20 MG tablet TAKE 1 TABLET(20 MG) BY MOUTH DAILY  . bisoprolol (ZEBETA) 5 MG tablet TAKE 1 AND 1/2 TABLETS(7.5 MG) BY MOUTH EVERY DAY  . calcium carbonate (TUMS EX) 750 MG chewable tablet Chew 1 tablet by mouth every 2 (two) hours as needed for heartburn.  . Calcium-Magnesium-Zinc 1000-400-15 MG TABS Take 1 tablet by mouth daily.  . hydrochlorothiazide (HYDRODIURIL) 25 MG tablet Take 1 tablet (25 mg total) by mouth daily.  . hydrOXYzine (ATARAX/VISTARIL) 10 MG tablet Take 1-2 tablets (10-20 mg total) by mouth at bedtime as needed for anxiety.  Marland Kitchen lisinopril (ZESTRIL) 40 MG tablet Take 1 tablet (40 mg total) by mouth daily.  . Multiple Vitamin (MULTIVITAMIN)  tablet Take 1 tablet by mouth daily.  . Omega-3 Fatty Acids (FISH OIL PO) Take 1 capsule by mouth daily.   . potassium chloride (K-DUR) 10 MEQ tablet Take 10 mEq by mouth daily as needed. for weight gain >2 lbs in 24 hr, >5 lb in 1 week, and leg swelling.  . sertraline (ZOLOFT) 25 MG tablet Take 1 tablet (25 mg total) by mouth daily. In evening  . torsemide (DEMADEX) 20 MG tablet Take 1 tablet (20 mg total) by mouth daily as needed (for weight gain >2 lbs in 24 hr, >5 lb in 1 week, and leg swelling.).  Marland Kitchen VITAMIN E PO Take 1 capsule by mouth daily.     Review of Systems  All other systems reviewed and are otherwise negative except as noted above.  Physical Exam    VS:  There were no vitals taken for this visit. , BMI There is no height or weight on file to calculate BMI.  Wt Readings from Last 3 Encounters:  12/04/20 186 lb (84.4 kg)  11/25/20 180 lb 9 oz (81.9 kg)  11/11/20 180 lb 7 oz (81.8  kg)    GEN: Well nourished, well developed, in no acute distress. HEENT: normal. Neck: Supple, no JVD, carotid bruits, or masses. Cardiac: RRR, no murmurs, rubs, or gallops. No clubbing, cyanosis, edema.  Radials/DP/PT 2+ and equal bilaterally.  Respiratory:  Respirations regular and unlabored, clear to auscultation bilaterally. GI: Soft, nontender, nondistended. MS: No deformity or atrophy. Skin: Warm and dry, no rash. Neuro:  Strength and sensation are intact. Psych: Normal affect.  Assessment & Plan    1. Hypertension-BP at goal of less than 130/80 since addition of hydrochlorothiazide.  Tolerating without difficulty.  Lab work 12/31/2020 showed normal renal function and potassium.  Continue present antihypertensive regimen including amlodipine 10 mg daily, bisoprolol 7.5 mg daily, hydrochlorothiazide 25 mg daily, lisinopril 40 mg daily.  Heart healthy diet and regular cardiovascular exercise encouraged.  2. Hyperlipidemia- 08/2020 LDL 49. Continue Atorvastatin 20mg  daily.   3. HFpEF  -euvolemic and well compensated on exam.  NYHA I.  GDMT includes bisoprolol, lisinopril, PRN torsemide.  4. History of CVA-continue aspirin and statin.  Disposition: Follow up in 3 month(s) with Dr. Saunders Revel or APP  Signed, Loel Dubonnet, NP 01/02/2021, 7:59 AM [place]

## 2021-01-02 ENCOUNTER — Encounter: Payer: Self-pay | Admitting: Family

## 2021-01-02 ENCOUNTER — Other Ambulatory Visit: Payer: Self-pay

## 2021-01-02 ENCOUNTER — Ambulatory Visit (INDEPENDENT_AMBULATORY_CARE_PROVIDER_SITE_OTHER): Payer: PPO | Admitting: Family

## 2021-01-02 VITALS — BP 122/68 | HR 54 | Ht 73.0 in | Wt 184.0 lb

## 2021-01-02 DIAGNOSIS — I5032 Chronic diastolic (congestive) heart failure: Secondary | ICD-10-CM | POA: Diagnosis not present

## 2021-01-02 DIAGNOSIS — E785 Hyperlipidemia, unspecified: Secondary | ICD-10-CM

## 2021-01-02 DIAGNOSIS — Z8673 Personal history of transient ischemic attack (TIA), and cerebral infarction without residual deficits: Secondary | ICD-10-CM

## 2021-01-02 DIAGNOSIS — I10 Essential (primary) hypertension: Secondary | ICD-10-CM | POA: Diagnosis not present

## 2021-01-02 NOTE — Patient Instructions (Signed)
Medication Instructions:  Continue your current medications.   *If you need a refill on your cardiac medications before your next appointment, please call your pharmacy*   Lab Work: None ordered today.   Testing/Procedures: None ordered today.    Follow-Up: At [facility], you and your health needs are our priority.  As part of our continuing mission to provide you with exceptional heart care, we have created designated Provider Care Teams.  These Care Teams include your primary Cardiologist (physician) and Advanced Practice Providers (APPs -  Physician Assistants and Nurse Practitioners) who all work together to provide you with the care you need, when you need it.  We recommend signing up for the patient portal called "MyChart".  Sign up information is provided on this After Visit Summary.  MyChart is used to connect with patients for Virtual Visits (Telemedicine).  Patients are able to view lab/test results, encounter notes, upcoming appointments, etc.  Non-urgent messages can be sent to your provider as well.   To learn more about what you can do with MyChart, go to [email or web address].    Your next appointment:   3 month(s)  The format for your next appointment:   In Person  Provider:   You may see Nelva Bush, MD or one of the following Advanced Practice Providers on your designated Care Team:    Murray Hodgkins, NP  Christell Faith, PA-C  Marrianne Mood, PA-C  Cadence Kathlen Mody, [place]  Laurann Montana, NP  Other Instructions Heart Healthy Diet Recommendations: A low-salt diet is recommended. Meats should be grilled, baked, or boiled. Avoid fried foods. Focus on lean protein sources like fish or chicken with vegetables and fruits. The American Heart Association is a Microbiologist!  American Heart Association Diet and Lifeystyle Recommendations   Exercise recommendations: The American Heart Association recommends 150 minutes of moderate intensity exercise  weekly. Try 30 minutes of moderate intensity exercise 4-5 times per week. This could include walking, jogging, or swimming.

## 2021-01-06 ENCOUNTER — Encounter: Payer: Self-pay | Admitting: *Deleted

## 2021-01-13 ENCOUNTER — Other Ambulatory Visit: Payer: Self-pay | Admitting: Family Medicine

## 2021-01-13 NOTE — Telephone Encounter (Signed)
I think that is cardiology

## 2021-01-13 NOTE — Telephone Encounter (Signed)
??   If PCP fills med, looks like cardiology put new comments on last Rx on 02/29/20, please advise

## 2021-01-13 NOTE — Telephone Encounter (Signed)
Will route to pt's cardiologist

## 2021-03-06 IMAGING — US US EXTREM  UP VENOUS*L*
1 series · 13 of 24 positions shown · non-contrast
Comparison: None.

CLINICAL DATA: Pain and swelling near the elbow.



[Series 1: us venous img upper uni left (dvt) · portal-venous · 13 of 31 slices shown]
[im 1/31]
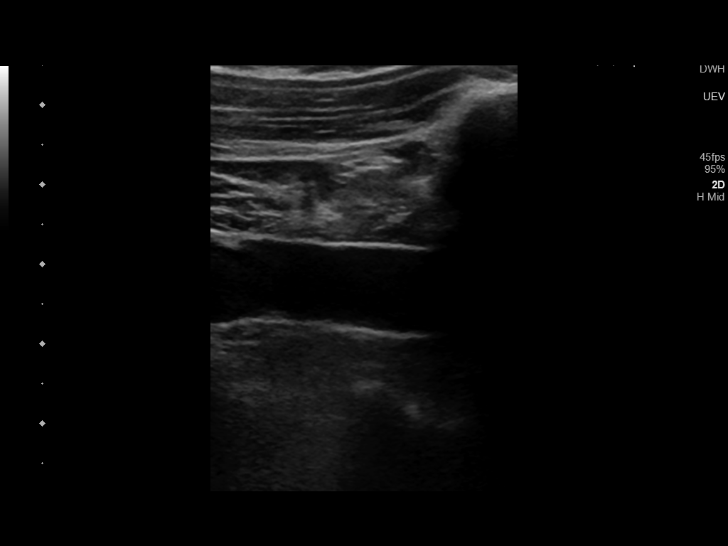
[im 3/31]
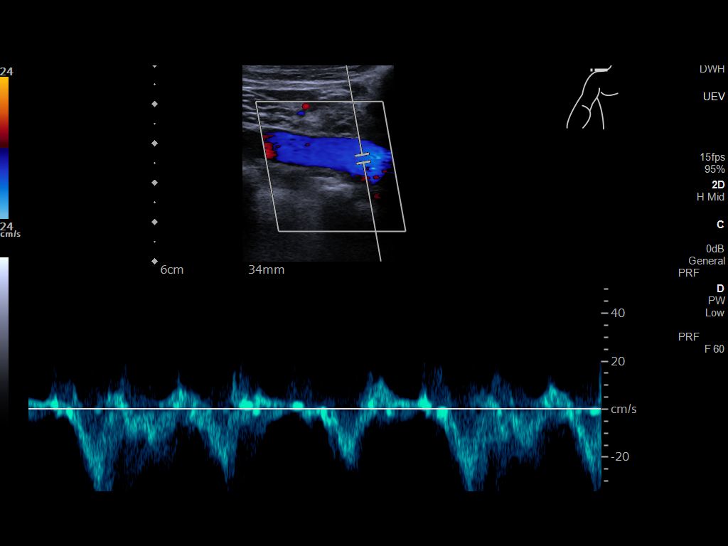
[im 6/31]
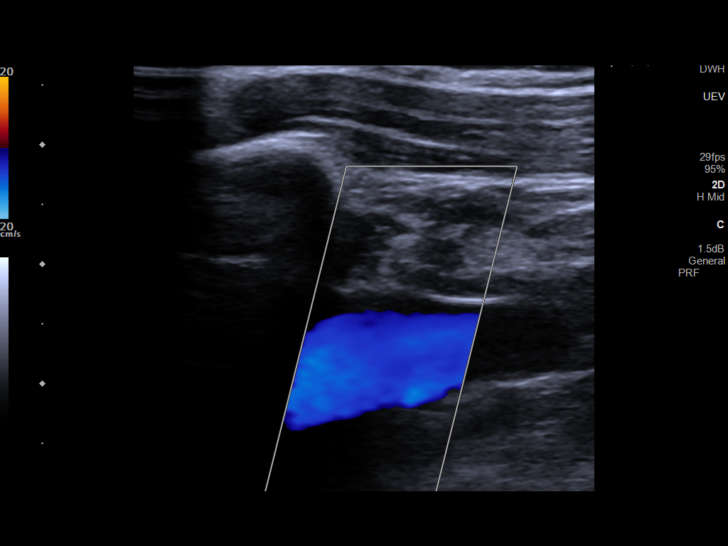
[im 8/31]
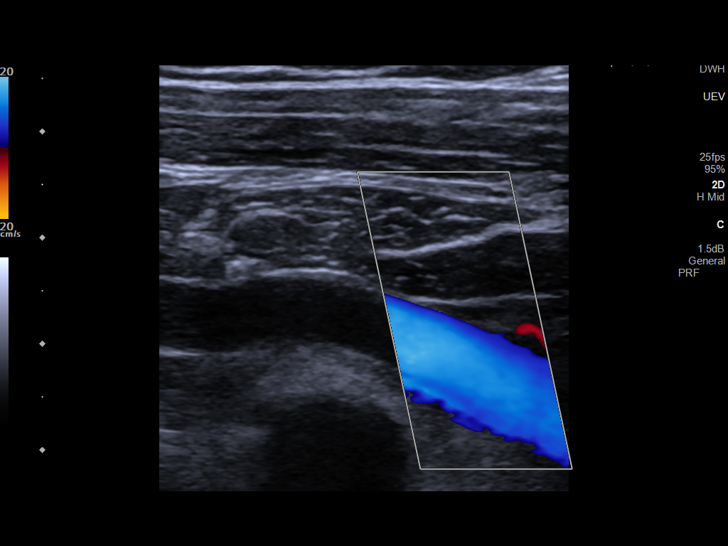
[im 11/31]
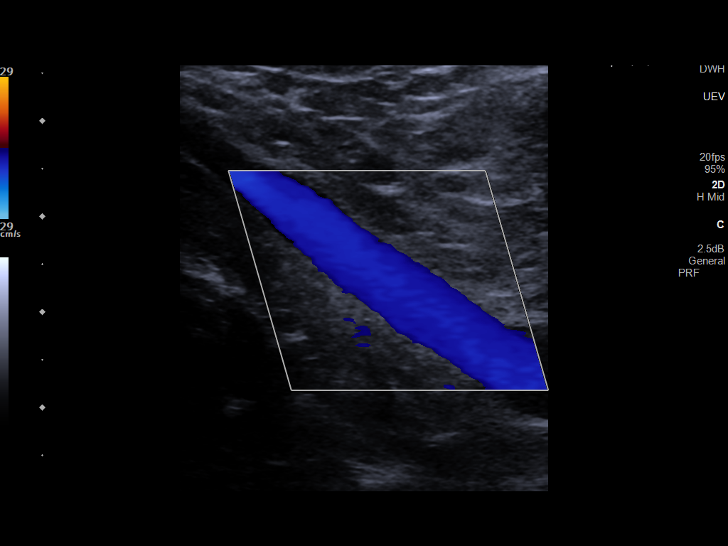
[im 14/31]
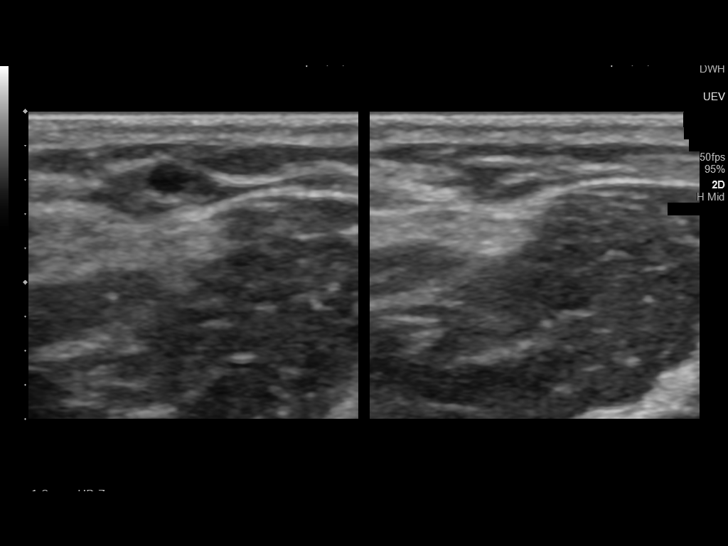
[im 16/31]
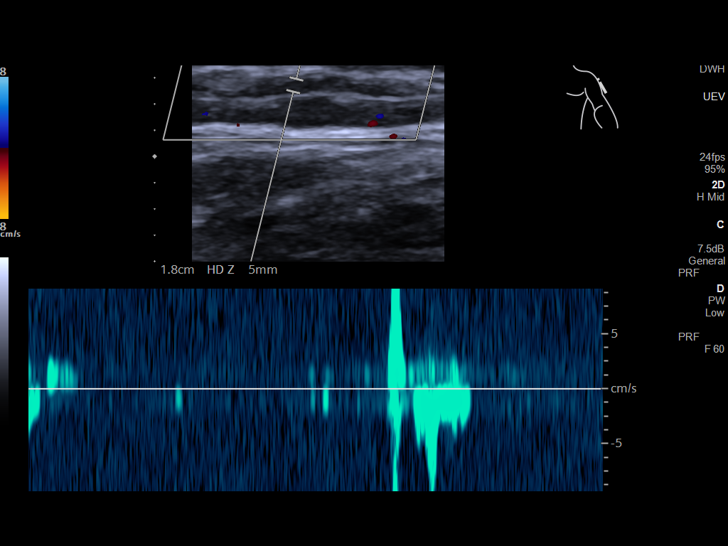
[im 17/31]
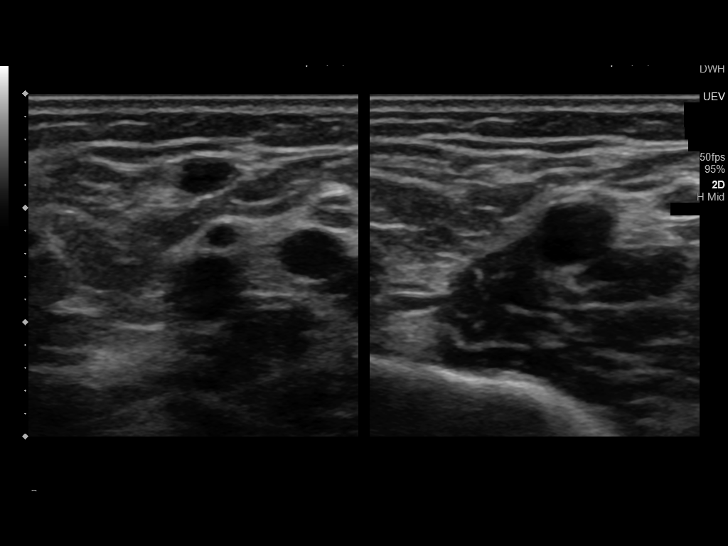
[im 20/31]
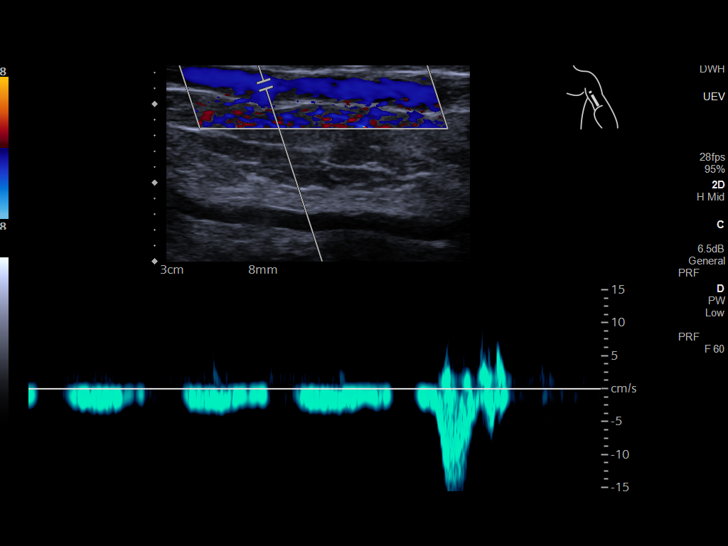
[im 23/31]
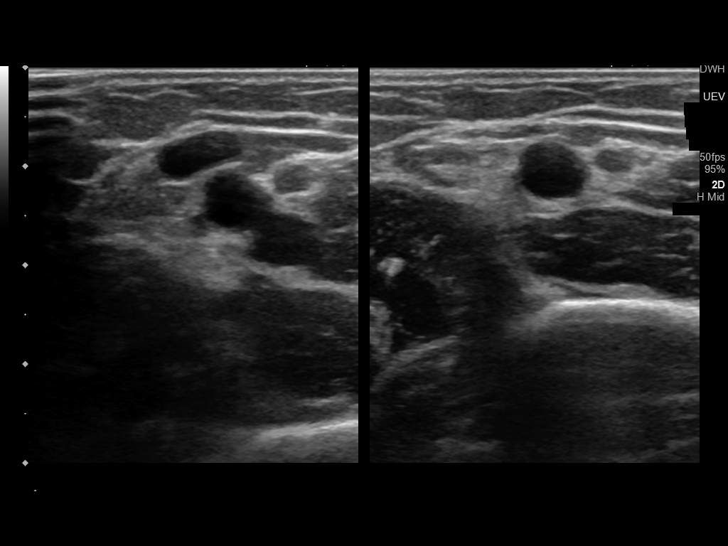
[im 25/31]
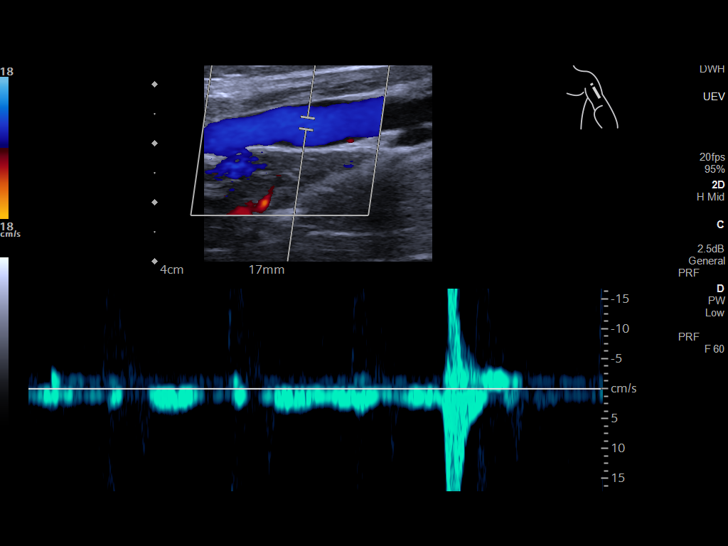
[im 28/31]
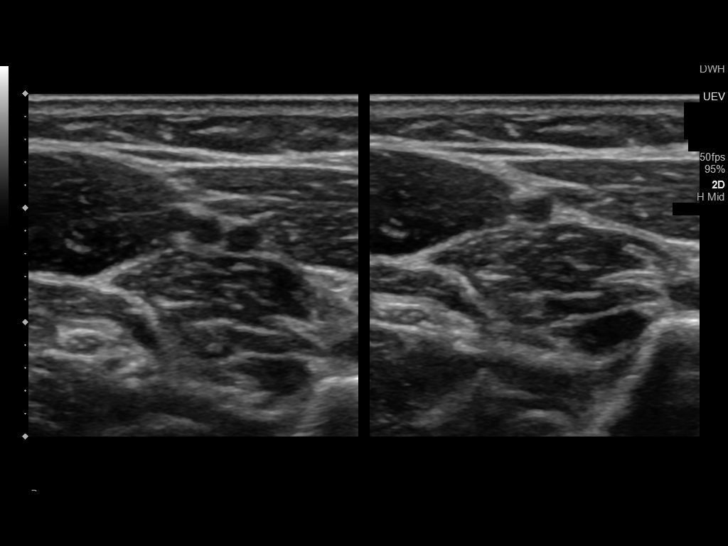
[im 31/31]
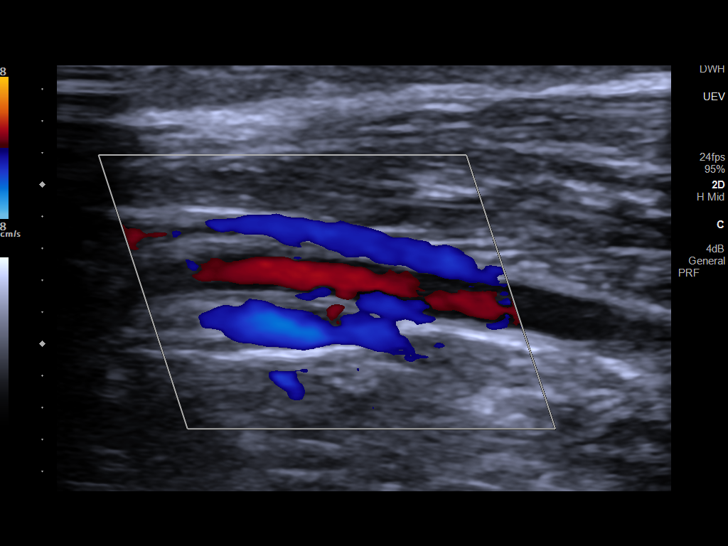

[13 of 24 positions shown; findings below may reference images not displayed]

FINDINGS: Contralateral Subclavian Vein: Respiratory phasicity is normal and
symmetric with the symptomatic side. No evidence of thrombus. Normal
compressibility.

Internal Jugular Vein: No evidence of thrombus. Normal
compressibility, respiratory phasicity and response to augmentation.

Subclavian Vein: No evidence of thrombus. Normal compressibility,
respiratory phasicity and response to augmentation.

Axillary Vein: No evidence of thrombus. Normal compressibility,
respiratory phasicity and response to augmentation.

Cephalic Vein: No evidence of thrombus. Normal compressibility,
respiratory phasicity and response to augmentation.

Basilic Vein: No evidence of thrombus. Normal compressibility,
respiratory phasicity and response to augmentation.

Brachial Veins: No evidence of thrombus. Normal compressibility,
respiratory phasicity and response to augmentation.

Radial Veins: No evidence of thrombus. Normal compressibility,
respiratory phasicity and response to augmentation.

Ulnar Veins: No evidence of thrombus. Normal compressibility,
respiratory phasicity and response to augmentation.

Other Findings:  None visualized.
IMPRESSION: No evidence of DVT within the left upper extremity.

## 2021-03-29 ENCOUNTER — Other Ambulatory Visit: Payer: Self-pay | Admitting: Internal Medicine

## 2021-04-15 ENCOUNTER — Ambulatory Visit: Payer: PPO | Admitting: Internal Medicine

## 2021-06-11 ENCOUNTER — Ambulatory Visit: Payer: PPO | Admitting: Internal Medicine

## 2021-06-11 ENCOUNTER — Encounter: Payer: Self-pay | Admitting: Internal Medicine

## 2021-06-11 ENCOUNTER — Other Ambulatory Visit: Payer: Self-pay

## 2021-06-11 VITALS — BP 140/80 | HR 56 | Ht 73.0 in | Wt 181.0 lb

## 2021-06-11 DIAGNOSIS — I10 Essential (primary) hypertension: Secondary | ICD-10-CM | POA: Diagnosis not present

## 2021-06-11 DIAGNOSIS — I509 Heart failure, unspecified: Secondary | ICD-10-CM

## 2021-06-11 DIAGNOSIS — I639 Cerebral infarction, unspecified: Secondary | ICD-10-CM

## 2021-06-11 DIAGNOSIS — I5032 Chronic diastolic (congestive) heart failure: Secondary | ICD-10-CM | POA: Diagnosis not present

## 2021-06-11 DIAGNOSIS — E785 Hyperlipidemia, unspecified: Secondary | ICD-10-CM | POA: Diagnosis not present

## 2021-06-11 NOTE — Progress Notes (Signed)
Follow-up Outpatient Visit Date: 06/11/2021  Primary Care Provider: Abner Greenspan, MD [place] [address]  Chief Complaint: Follow-up hypertension and HFpEF  HPI:  Mr. Lino is a 80 y.o. male with history of stroke, hypertension, hyperlipidemia, and HFpEF, who presents for follow-up of hypertension and HFpEF.  He was last seen in our office in late March by Laurann Montana, NP, at which time he reported improved blood pressure readings following addition of HCTZ in February.  No medication changes or additional testing were pursued.  Today, Mr. Gatewood reports that he has been feeling quite well.  He denies chest pain, shortness of breath, palpitations, lightheadedness, edema, and focal neurologic changes.  His blood pressure has improved, with home readings over the last 2 weeks ranging from 124-142/60-83.  He is using his as needed torsemide about once a week for weight gain.  He admits to not exercising regularly.  --------------------------------------------------------------------------------------------------  Cardiovascular History & Procedures: Cardiovascular Problems: Cryptogenic stroke Mildly dilated ascending aorta HFpEF   Risk Factors: Hypertension, hyperlipidemia, male gender, and age greater than 16   Cath/PCI: None   CV Surgery: None   EP Procedures and Devices: Implantable loop recorder (12/21/16) 24-hour Holter monitor (10/20/16, [place]): Predominant rhythm was sinus with an average rate of 63 bpm (range 46-93 bpm). Rare supraventricular and ventricular ectopy was identified. There was one episode of nonsustained ventricular tachycardia lasting 4 beats. No sustained arrhythmias, including atrial fibrillation/flutter) are evident.   Non-Invasive Evaluation(s): Exercise tolerance test (03/27/2020): Low risk study without evidence of ischemia.  Rare isolated PVCs noted before and during stress. TTE (02/05/2020, [facility]): Normal LV size with mild LVH.  LVEF 55-60% with grade 2 diastolic dysfunction and elevated filling pressures.  Normal RV size and function.  Moderate left and mild right atrial enlargement.  Mild to moderate mitral regurgitation.  Mild tricuspid regurgitation.  Aortic sclerosis.  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  TEE (12/21/16): Normal LV size with mild LVH. LVEF 55-65%. Mild aortic regurgitation. Mildly dilated ascending aorta. Mild mitral regurgitation. No left or right atrial thrombus. No PFO or ASD; negative TTE (12/02/16): Normal LV size with mild LVH. LVEF 55-60% with normal wall motion. Normal diastolic function. Moderate AI. Mildly dilated ascending aorta measuring 3.9 cm. Mild MR. Normal RV size and function. Transthoracic echocardiogram (10/2016; [place]): Reportedly normal, though no images or reports is available for review.  Recent CV Pertinent Labs: Lab Results  Component Value Date   CHOL 128 08/18/2020   HDL 41.40 08/18/2020   LDLCALC 25 01/10/2017   LDLDIRECT 49.0 08/18/2020   TRIG 262.0 (H) 08/18/2020   CHOLHDL 3 08/18/2020   INR 1.1 12/20/2018   K 3.9 12/31/2020   K 3.9 05/15/2009   BUN 15 12/31/2020   BUN 12 05/15/2009   CREATININE 0.73 12/31/2020   CREATININE 0.8 05/15/2009    Past medical and surgical history were reviewed and updated in EPIC.  Current Meds  Medication Sig   allopurinol (ZYLOPRIM) 300 MG tablet TAKE 1 TABLET(300 MG) BY MOUTH DAILY AT NIGHT   amLODipine (NORVASC) 10 MG tablet Take 1 tablet (10 mg total) by mouth daily.   Ascorbic Acid (VITAMIN C) 1000 MG tablet Take 1,000 mg by mouth daily.   aspirin EC 81 MG tablet Take 162 mg by mouth 2 (two) times daily.   atorvastatin (LIPITOR) 20 MG tablet TAKE 1 TABLET(20 MG) BY MOUTH DAILY   bisoprolol (ZEBETA) 5 MG tablet TAKE  1 AND 1/2 TABLETS(7.5 MG) BY MOUTH EVERY DAY   calcium carbonate (TUMS EX) 750 MG chewable tablet Chew 1 tablet by mouth every 2 (two) hours as needed  for heartburn.   Calcium-Magnesium-Zinc 1000-400-15 MG TABS Take 1 tablet by mouth daily.   lisinopril (ZESTRIL) 40 MG tablet TAKE 1 TABLET(40 MG) BY MOUTH DAILY   Multiple Vitamin (MULTIVITAMIN) tablet Take 1 tablet by mouth daily.   Omega-3 Fatty Acids (FISH OIL PO) Take 1 capsule by mouth daily.    potassium chloride (K-DUR) 10 MEQ tablet Take 10 mEq by mouth daily as needed. for weight gain >2 lbs in 24 hr, >5 lb in 1 week, and leg swelling.   torsemide (DEMADEX) 20 MG tablet Take 1 tablet (20 mg total) by mouth daily as needed (weight gain or swelling).   VITAMIN E PO Take 1 capsule by mouth daily.    Allergies: Patient has no known allergies.  Social History   Tobacco Use   Smoking status: Former    Packs/day: 1.00    Years: 8.00    Pack years: 8.00    Types: Cigarettes    Quit date: 1973    Years since quitting: 49.7   Smokeless tobacco: Never  Vaping Use   Vaping Use: Never used  Substance Use Topics   Alcohol use: Not Currently    Alcohol/week: 7.0 standard drinks    Types: 7 Glasses of wine per week    Comment: weekly-with dinner   Drug use: No    Family History  Problem Relation Age of Onset   Other Mother        brain tumor   Skin cancer Mother        suspect melanoma   Heart attack Father 37   Colon cancer Sister     Review of Systems: A 12-system review of systems was performed and was negative except as noted in the HPI.  --------------------------------------------------------------------------------------------------  Physical Exam: BP 140/80 (BP Location: Left Arm, Patient Position: Sitting, Cuff Size: Normal)   Pulse (!) 56   Ht '6\' 1"'$  (1.854 m)   Wt 181 lb (82.1 kg)   SpO2 96%   BMI 23.88 kg/m   General:  NAD. Neck: No JVD or HJR. Lungs: Clear to auscultation bilaterally without wheezes or crackles. Heart: Bradycardic but regular without murmurs, rubs, or gallops. Abdomen: Soft, nontender, nondistended. Extremities: No lower extremity  edema.  EKG: Sinus bradycardia with low voltage.  Heart rate has decreased since 08/20/2020.  Otherwise, no significant interval change  Lab Results  Component Value Date   WBC 6.6 12/31/2020   HGB 14.0 12/31/2020   HCT 40.4 12/31/2020   MCV 93.5 12/31/2020   PLT 118.0 (L) 12/31/2020    Lab Results  Component Value Date   NA 139 12/31/2020   K 3.9 12/31/2020   CL 104 12/31/2020   CO2 28 12/31/2020   BUN 15 12/31/2020   CREATININE 0.73 12/31/2020   GLUCOSE 151 (H) 12/31/2020   ALT 52 08/18/2020    Lab Results  Component Value Date   CHOL 128 08/18/2020   HDL 41.40 08/18/2020   LDLCALC 25 01/10/2017   LDLDIRECT 49.0 08/18/2020   TRIG 262.0 (H) 08/18/2020   CHOLHDL 3 08/18/2020    --------------------------------------------------------------------------------------------------  ASSESSMENT AND PLAN: Chronic HFpEF: Mr. Jeronimo Norma is appears euvolemic on exam today with NYHA class I symptoms.  He is using his as needed torsemide once a week with good results.  We will defer medication changes today.  Most recent labs in March showed normal potassium and renal function.  Cryptogenic stroke: No new neurologic changes to suggest recurrent stroke.  Continue aspirin and atorvastatin for secondary prevention as well as aggressive blood pressure control.  Hypertension: Blood pressure control improved, albeit on 4 agents.  Renal artery Doppler earlier this year showed no evidence of renal artery stenosis.  Serum aldosterone was normal though plasma renin activity was undetectable.  Given improved blood pressure, we will continue current regimen for now and work on lifestyle modifications.  If blood pressure control worsens, we may need to consider endocrinology consultation.  Hyperlipidemia: LDL well controlled on last check.  Triglycerides mildly elevated.  Continue atorvastatin and fish oil.  Follow-up: Return to clinic in 6 months  Nelva Bush, MD 06/12/2021 11:06 AM

## 2021-06-11 NOTE — Patient Instructions (Signed)

## 2021-06-12 ENCOUNTER — Encounter: Payer: Self-pay | Admitting: Internal Medicine

## 2021-06-23 ENCOUNTER — Other Ambulatory Visit: Payer: Self-pay | Admitting: Internal Medicine

## 2021-06-23 ENCOUNTER — Telehealth: Payer: Self-pay | Admitting: Internal Medicine

## 2021-06-23 NOTE — Telephone Encounter (Signed)
*  STAT* If patient is at the pharmacy, call can be transferred to refill team.   1. Which medications need to be refilled? (please list name of each medication and dose if known) Zestril '40mg'$   2. Which pharmacy/location (including street and city if local pharmacy) is medication to be sent to? [facility] [address]  3. Do they need a 30 day or 90 day supply? 90days

## 2021-06-23 NOTE — Telephone Encounter (Signed)
Refill already requested by pharmacy earlier today and authorized-receipt confirmed.

## 2021-08-24 DIAGNOSIS — R059 Cough, unspecified: Secondary | ICD-10-CM | POA: Diagnosis not present

## 2021-09-16 ENCOUNTER — Other Ambulatory Visit: Payer: Self-pay | Admitting: Internal Medicine

## 2021-09-30 ENCOUNTER — Emergency Department: Payer: PPO

## 2021-09-30 ENCOUNTER — Other Ambulatory Visit: Payer: Self-pay

## 2021-09-30 ENCOUNTER — Emergency Department
Admission: EM | Admit: 2021-09-30 | Discharge: 2021-09-30 | Disposition: A | Discharge status: Home / Self Care | Payer: PPO | Attending: Emergency Medicine | Admitting: Emergency Medicine

## 2021-09-30 ENCOUNTER — Encounter: Payer: Self-pay | Admitting: Emergency Medicine

## 2021-09-30 DIAGNOSIS — Z8673 Personal history of transient ischemic attack (TIA), and cerebral infarction without residual deficits: Secondary | ICD-10-CM | POA: Diagnosis not present

## 2021-09-30 DIAGNOSIS — I110 Hypertensive heart disease with heart failure: Secondary | ICD-10-CM | POA: Diagnosis not present

## 2021-09-30 DIAGNOSIS — Z79899 Other long term (current) drug therapy: Secondary | ICD-10-CM | POA: Insufficient documentation

## 2021-09-30 DIAGNOSIS — R1011 Right upper quadrant pain: Secondary | ICD-10-CM | POA: Diagnosis not present

## 2021-09-30 DIAGNOSIS — Z85828 Personal history of other malignant neoplasm of skin: Secondary | ICD-10-CM | POA: Insufficient documentation

## 2021-09-30 DIAGNOSIS — Z7982 Long term (current) use of aspirin: Secondary | ICD-10-CM | POA: Diagnosis not present

## 2021-09-30 DIAGNOSIS — K5900 Constipation, unspecified: Secondary | ICD-10-CM | POA: Insufficient documentation

## 2021-09-30 DIAGNOSIS — K7689 Other specified diseases of liver: Secondary | ICD-10-CM | POA: Insufficient documentation

## 2021-09-30 DIAGNOSIS — R7401 Elevation of levels of liver transaminase levels: Secondary | ICD-10-CM | POA: Diagnosis not present

## 2021-09-30 DIAGNOSIS — Z87891 Personal history of nicotine dependence: Secondary | ICD-10-CM | POA: Diagnosis not present

## 2021-09-30 DIAGNOSIS — K828 Other specified diseases of gallbladder: Secondary | ICD-10-CM | POA: Diagnosis not present

## 2021-09-30 DIAGNOSIS — I509 Heart failure, unspecified: Secondary | ICD-10-CM | POA: Insufficient documentation

## 2021-09-30 DIAGNOSIS — N281 Cyst of kidney, acquired: Secondary | ICD-10-CM | POA: Insufficient documentation

## 2021-09-30 LAB — CBC
HCT: 43.9 % (ref 39.0–52.0)
Hemoglobin: 15.2 g/dL (ref 13.0–17.0)
MCH: 32.1 pg (ref 26.0–34.0)
MCHC: 34.6 g/dL (ref 30.0–36.0)
MCV: 92.8 fL (ref 80.0–100.0)
Platelets: 143 10*3/uL — ABNORMAL LOW (ref 150–400)
RBC: 4.73 MIL/uL (ref 4.22–5.81)
RDW: 12.7 % (ref 11.5–15.5)
WBC: 9.3 10*3/uL (ref 4.0–10.5)
nRBC: 0 % (ref 0.0–0.2)

## 2021-09-30 LAB — COMPREHENSIVE METABOLIC PANEL
ALT: 74 U/L — ABNORMAL HIGH (ref 0–44)
AST: 59 U/L — ABNORMAL HIGH (ref 15–41)
Albumin: 4.1 g/dL (ref 3.5–5.0)
Alkaline Phosphatase: 62 U/L (ref 38–126)
Anion gap: 9 (ref 5–15)
BUN: 17 mg/dL (ref 8–23)
CO2: 30 mmol/L (ref 22–32)
Calcium: 9.2 mg/dL (ref 8.9–10.3)
Chloride: 99 mmol/L (ref 98–111)
Creatinine, Ser: 0.89 mg/dL (ref 0.61–1.24)
GFR, Estimated: >60 mL/min (ref >60)
Glucose, Bld: 124 mg/dL — ABNORMAL HIGH (ref 70–99)
Potassium: 3.7 mmol/L (ref 3.5–5.1)
Sodium: 138 mmol/L (ref 135–145)
Total Bilirubin: 1.1 mg/dL (ref 0.3–1.2)
Total Protein: 6.9 g/dL (ref 6.5–8.1)

## 2021-09-30 LAB — URINALYSIS, MICROSCOPIC (REFLEX): Bacteria, UA: NONE SEEN

## 2021-09-30 LAB — URINALYSIS, ROUTINE W REFLEX MICROSCOPIC
Bilirubin Urine: NEGATIVE
Glucose, UA: NEGATIVE mg/dL
Ketones, ur: NEGATIVE mg/dL
Leukocytes,Ua: NEGATIVE
Nitrite: NEGATIVE
Protein, ur: NEGATIVE mg/dL
Specific Gravity, Urine: 1.015 (ref 1.005–1.030)
pH: 7 (ref 5.0–8.0)

## 2021-09-30 LAB — LIPASE, BLOOD: Lipase: 39 U/L (ref 11–51)

## 2021-09-30 MED ORDER — PANTOPRAZOLE SODIUM 40 MG PO TBEC: 40.0000 mg | DELAYED_RELEASE_TABLET | Freq: Every day | ORAL | 1 refills | Status: DC

## 2021-09-30 NOTE — ED Provider Notes (Signed)
[facility] Emergency Department Provider Note   ____________________________________________   Event Date/Time   First MD Initiated Contact with Patient 09/30/21 1814     (approximate)  I have reviewed the triage vital signs and the nursing notes.   HISTORY  Chief Complaint Abdominal Pain    HPI Jeffrey [name] is a 80 y.o. male patient complains of epigastric pain that has moved to the right upper quadrant.  It comes on intermittently.  It can be quite sharp at times other times it completely resolves.  It does not change when he takes a deep breath.  Eating does not do anything to it.  No nausea or vomiting.  He has not had it before.  He says he does feel slightly constipated and wonders if that could be doing it.  Does not feel like his gastritis.         Past Medical History:  Diagnosis Date   DDD (degenerative disc disease)    with epidural injection   Gastritis    H pylori, partial tx.  EGD 04/2002   Gout    History of skin cancer    Hyperlipidemia    Hypertension    some white coat component- better at home   Stroke 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   Cryptogenic stroke ([place]) 11/08/2016   Blurred vision 11/05/2014   Prostate cancer screening 11/05/2014   Family history of colon cancer 08/01/2013   Colon cancer screening 08/01/2013   Routine general medical examination at a health care facility 08/01/2013   PSA, INCREASED 09/07/2010   HERNIATED DISC 11/24/2009   LUMBAR RADICULOPATHY,  RIGHT 11/24/2009   Thrombocytopenia ([place]) 01/16/2009   Hyperlipidemia LDL goal <70 06/08/2007   Gout 06/08/2007   Essential hypertension 06/08/2007   Prediabetes 06/08/2007   SKIN CANCER, HX OF 06/08/2007    Past Surgical History:  Procedure Laterality Date   COLONOSCOPY WITH PROPOFOL N/A 01/16/2018   Procedure: COLONOSCOPY WITH PROPOFOL;  Surgeon: Lollie Sails, MD;  Location: [facility] ENDOSCOPY;  Service: Endoscopy;  Laterality: N/A;   HEMORRHOID SURGERY  1997   LOOP RECORDER INSERTION N/A 12/21/2016   Procedure: Loop Recorder Insertion;  Surgeon: Deboraha Sprang, MD;  Location: [facility] LAB;  Service: Cardiovascular;  Laterality: N/A;   TEE WITHOUT CARDIOVERSION N/A 12/21/2016   Procedure: TRANSESOPHAGEAL ECHOCARDIOGRAM (TEE);  Surgeon: Nelva Bush, MD;  Location: [facility] ORS;  Service: Cardiovascular;  Laterality: N/A;    Prior to Admission medications   Medication Sig Start Date End Date Taking? Authorizing Provider  pantoprazole (PROTONIX) 40 MG tablet Take 1 tablet (40 mg total) by mouth daily. 09/30/21 09/30/22 Yes Nena Polio, MD  allopurinol (ZYLOPRIM) 300 MG tablet TAKE 1 TABLET(300 MG) BY MOUTH DAILY AT NIGHT 08/21/20   Tower, Wynelle Fanny, MD  amLODipine (NORVASC) 10 MG tablet Take 1 tablet (10 mg total) by mouth daily. 11/07/20 11/07/21  Ward, Delice Bison, DO  Ascorbic Acid (VITAMIN C) 1000 MG tablet Take 1,000 mg by mouth daily.  [provider]  aspirin EC 81 MG tablet Take 162 mg by mouth 2 (two) times daily.    [provider]  atorvastatin (LIPITOR) 20 MG tablet TAKE 1 TABLET(20 MG) BY MOUTH DAILY 08/21/20   Tower, Wynelle Fanny, MD  bisoprolol (ZEBETA) 5 MG tablet TAKE 1 AND 1/2 TABLETS(7.5 MG) BY MOUTH EVERY DAY 08/21/20   Tower, Wynelle Fanny, MD  calcium carbonate (TUMS EX) 750 MG chewable tablet Chew 1 tablet by mouth every 2 (two) hours as needed for heartburn.    [provider]  Calcium-Magnesium-Zinc 1000-400-15 MG TABS Take 1 tablet by mouth  daily.    [provider]  hydrochlorothiazide (HYDRODIURIL) 25 MG tablet TAKE 1 TABLET(25 MG) BY MOUTH DAILY 09/16/21   End, Harrell Gave, MD  lisinopril (ZESTRIL) 40 MG tablet TAKE 1 TABLET(40 MG) BY MOUTH DAILY 06/23/21   End, Harrell Gave, MD  Multiple Vitamin (MULTIVITAMIN) tablet Take 1 tablet by mouth daily.    [provider]  Omega-3 Fatty Acids (FISH OIL PO) Take 1 capsule by mouth daily.     [provider]  potassium chloride (K-DUR) 10 MEQ tablet Take 10 mEq by mouth daily as needed. for weight gain >2 lbs in 24 hr, >5 lb in 1 week, and leg swelling.    [provider]  sertraline (ZOLOFT) 25 MG tablet Take 1 tablet (25 mg total) by mouth daily. In evening 11/11/20   Tower, Wynelle Fanny, MD  torsemide (DEMADEX) 20 MG tablet Take 1 tablet (20 mg total) by mouth daily as needed (weight gain or swelling). 01/14/21   End, Harrell Gave, MD  VITAMIN E PO Take 1 capsule by mouth daily.    [provider]    Allergies Patient has no known allergies.  Family History  Problem Relation Age of Onset   Other Mother        brain tumor   Skin cancer Mother        suspect melanoma   Heart attack Father 55   Colon cancer Sister     Social History Social History   Tobacco Use   Smoking status: Former    Packs/day: 1.00    Years: 8.00    Pack years: 8.00    Types: Cigarettes    Quit date: 1973    Years since quitting: 50.0   Smokeless tobacco: Never  Vaping Use   Vaping Use: Never used  Substance Use Topics   Alcohol use: Not Currently    Alcohol/week: 7.0 standard drinks    Types: 7 Glasses of wine per week    Comment: weekly-with dinner   Drug use: No    Review of Systems  Constitutional: No fever/chills Eyes: No visual changes. ENT: No sore throat. Cardiovascular: Denies chest pain. Respiratory: Denies shortness of breath. Gastrointestinal: Intermittent sharp pinching abdominal pain.  No nausea, no vomiting.  No diarrhea.  No  constipation. Genitourinary: Negative for dysuria. Musculoskeletal: Negative for back pain. Skin: Negative for rash. Neurological: Negative for headaches, focal weakness    ____________________________________________   PHYSICAL EXAM:  VITAL SIGNS: ED Triage Vitals  Enc Vitals Group     BP 09/30/21 1452 133/77     Pulse Rate 09/30/21 1452 66     Resp 09/30/21 1452 15     Temp 09/30/21 1452 98.4 F (36.9 C)     Temp Source 09/30/21 1452 Oral     SpO2 09/30/21 1452 97 %     Weight 09/30/21 1454 180 lb (81.6 kg)  Height 09/30/21 1454 6' (1.829 m)     Head Circumference --      Peak Flow --      Pain Score 09/30/21 1453 8     Pain Loc --      Pain Edu? --      Excl. in [place]? --     Constitutional: Alert and oriented. Well appearing and in no acute distress. Eyes: Conjunctivae are normal.  Head: Atraumatic. Nose: No congestion/rhinnorhea. Mouth/Throat: Mucous membranes are moist.  Oropharynx non-erythematous. Neck: No stridor.  Cardiovascular: Normal rate, regular rhythm. Grossly normal heart sounds.  Good peripheral circulation. Respiratory: Normal respiratory effort.  No retractions. Lungs CTAB. Gastrointestinal: Soft and nontender to light palpation but with deeper palpation he has tenderness in the right upper quadrant.  There is no tenderness to percussion. No distention. No abdominal bruits. No CVA tenderness. Musculoskeletal: No lower extremity tenderness Neurologic:  Normal speech and language. No gross focal neurologic deficits are appreciated.  Skin:  Skin is warm, dry and intact. No rash noted.   ____________________________________________   LABS (all labs ordered are listed, but only abnormal results are displayed)  Labs Reviewed  COMPREHENSIVE METABOLIC PANEL - Abnormal; Notable for the following components:      Result Value   Glucose, Bld 124 (*)    AST 59 (*)    ALT 74 (*)    All other components within normal limits  CBC - Abnormal; Notable for  the following components:   Platelets 143 (*)    All other components within normal limits  URINALYSIS, ROUTINE W REFLEX MICROSCOPIC - Abnormal; Notable for the following components:   Hgb urine dipstick TRACE (*)    All other components within normal limits  LIPASE, BLOOD  URINALYSIS, MICROSCOPIC (REFLEX)  HEPATITIS PANEL, ACUTE   ____________________________________________  EKG   ____________________________________________  RADIOLOGY Gertha Calkin, personally viewed and evaluated these images (plain radiographs) as part of my medical decision making, as well as reviewing the written report by the radiologist.  ED MD interpretation:   KUB read by radiology reviewed by me does not show any large amount of stool. Official radiology report(s): DG Abdomen 1 View  Result Date: 09/30/2021 CLINICAL DATA:  Right upper quadrant abdominal pain. Elevated liver function tests. Mid abdominal pain. EXAM: ABDOMEN - 1 VIEW COMPARISON:  03/10/2016 FINDINGS: Bowel gas pattern unremarkable.  The lung bases appear clear. Axial loss of articular space in the hips, left greater than right, likely degenerative. No findings of organomegaly. IMPRESSION: 1. Unremarkable bowel gas pattern. 2. 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 Electronically Signed   By: Van Clines M.D.   On: 09/30/2021 18:47   US Abdomen Limited RUQ (LIVER/GB)  Result Date: 09/30/2021 CLINICAL DATA:  Right upper quadrant pain. EXAM: ULTRASOUND ABDOMEN LIMITED RIGHT UPPER QUADRANT COMPARISON:  None. FINDINGS: Gallbladder: No gallstones are visualized. The gallbladder is contracted. The gallbladder wall measures 5.7 mm in thickness. No sonographic Murphy sign noted by sonographer. Common bile duct: Diameter: 2.3 mm Liver: A 3.1 cm x 2.2 cm x 2.7 cm anechoic structure is seen within the right lobe of the liver. No abnormal flow is seen within this region on color Doppler evaluation. Within normal limits in parenchymal  echogenicity. Portal vein is patent on color Doppler imaging with normal direction of blood flow towards the liver. Other: A 5.7 cm x 5.2 cm x 6.1 cm anechoic structure is seen within the right kidney. No abnormal flow is seen within this region  on color Doppler evaluation. IMPRESSION: 1. Gallbladder wall thickening without evidence of cholelithiasis or acute cholecystitis. 2. Large simple hepatic and simple right renal cysts. Electronically Signed   By: Virgina Norfolk M.D.   On: 09/30/2021 20:03    ____________________________________________   PROCEDURES  Procedure(s) performed (including Critical Care):  Procedures   ____________________________________________   INITIAL IMPRESSION / ASSESSMENT AND PLAN / ED COURSE  ----------------------------------------- 8:58 PM on 09/30/2021 ----------------------------------------- On reexam patient is pain-free now.  Ultrasound which I reviewed only shows a cyst in his liver and kidney.  There is minimal elevation of the AST and ALT.  His white count and other studies are all not suspicious.  Patient tells me when I am going into talk to him about follow-up with surgery outpatient that his stools are as thin as his little finger and have been for several weeks.  Every stool is like that.  I then called Dr. Allen Norris, GI.  He agreed to follow him up and the next week or so.  He is okay with the patient going down to [place] for the holiday as the patient plans to do.  Patient is now symptom-free.  I told the patient if he gets worse here he should stay here if he is okay here and he gets worse down there make sure he follows up with hospitals there there is a lot of good ones in [place].  Otherwise he can follow-up when he gets back.  I have asked him to call Dr. Hampton Abbot and Dr. Allen Norris.              ____________________________________________   FINAL CLINICAL IMPRESSION(S) / ED DIAGNOSES  Final diagnoses:  Right upper quadrant abdominal  pain     ED Discharge Orders          Ordered    pantoprazole (PROTONIX) 40 MG tablet  Daily        09/30/21 2051             Note:  This document was prepared using Dragon voice recognition software and may include unintentional dictation errors.    Nena Polio, MD 09/30/21 2100

## 2021-09-30 NOTE — ED Notes (Signed)
Pt taken to x-ray, ambulatory without difficulty

## 2021-09-30 NOTE — ED Notes (Signed)
Sent light green, dark green, lavender tubes to lab

## 2021-09-30 NOTE — Discharge Instructions (Addendum)
Please return for increasing pain or any fever or vomiting.  Please take the Protonix as directed.  This may help with the pain if it is from an ulcer or gastritis.  Please follow-up with Dr. Hampton Abbot of the surgeon.  He is going to call you tomorrow to set up the appointment but you can also call him at the number of provided.  I also want you to follow-up with gastroenterology, Dr. Allen Norris, since your stools are so thin and have been for some time.  I spoke with him and he said he agrees that another colonoscopy there is an would be a good idea.  I have included his phone number also.  I have told him of your plans for the holidays and he is okay with that.

## 2021-09-30 NOTE — ED Triage Notes (Signed)
Pt via POV c/o mid-upper abdominal pain intermittently, rated 8/10, with no other symptoms. No change with deep inspiration or eating, no cough, no nausea or vomiting. No prior hx of indigestion.

## 2021-09-30 NOTE — ED Notes (Signed)
Pt reports that this am he started having epigastric pain and it was intermittent, pt states that the pain then moved to his ruq

## 2021-10-01 ENCOUNTER — Telehealth: Payer: Self-pay

## 2021-10-01 LAB — HEPATITIS PANEL, ACUTE
HCV Ab: NONREACTIVE
Hep A IgM: NONREACTIVE
Hep B C IgM: NONREACTIVE
Hepatitis B Surface Ag: NONREACTIVE

## 2021-10-01 NOTE — Telephone Encounter (Signed)
Received message from DR.Piscoya to schedule an appointment to be seen for Right upper quadrant pain -referral from ED- spoke with patient and scheduled 10/19/2021 -arrive at 2:45 am. Patient verbalized understanding.

## 2021-10-19 ENCOUNTER — Other Ambulatory Visit: Payer: Self-pay

## 2021-10-19 ENCOUNTER — Ambulatory Visit: Payer: PPO | Admitting: Surgery

## 2021-10-19 VITALS — BP 136/78 | HR 63 | Temp 98.0°F | Ht 73.0 in | Wt 183.0 lb

## 2021-10-19 DIAGNOSIS — Z86010 Personal history of colonic polyps: Secondary | ICD-10-CM

## 2021-10-19 DIAGNOSIS — R1011 Right upper quadrant pain: Secondary | ICD-10-CM

## 2021-10-19 DIAGNOSIS — K5900 Constipation, unspecified: Secondary | ICD-10-CM | POA: Diagnosis not present

## 2021-10-19 NOTE — Patient Instructions (Addendum)
Referral sent to [facility]. Someone form their office will contact you to schedule an appointment. If you do not hear from anyone within 5-7 days please call their office. [phone].  Be sure to have the Colonoscopy before coming to your next appointment with Dr.Piscoya.     Constipation, Adult Constipation is when a person has fewer than three bowel movements in a week, has difficulty having a bowel movement, or has stools (feces) that are dry, hard, or larger than normal. Constipation may be caused by an underlying condition. It may become worse with age if a person takes certain medicines and does not take in enough fluids. Follow these instructions at home: Eating and drinking  Eat foods that have a lot of fiber, such as beans, whole grains, and fresh fruits and vegetables. Limit foods that are low in fiber and high in fat and processed sugars, such as fried or sweet foods. These include french fries, hamburgers, cookies, candies, and soda. Drink enough fluid to keep your urine pale yellow. General instructions Exercise regularly or as told by your health care provider. Try to do 150 minutes of moderate exercise each week. Use the bathroom when you have the urge to go. Do not hold it in. Take over-the-counter and prescription medicines only as told by your health care provider. This includes any fiber supplements. During bowel movements: Practice deep breathing while relaxing the lower abdomen. Practice pelvic floor relaxation. Watch your condition for any changes. Let your health care provider know about them. Keep all follow-up visits as told by your health care provider. This is important. Contact a health care provider if: You have pain that gets worse. You have a fever. You do not have a bowel movement after 4 days. You vomit. You are not hungry or you lose weight. You are bleeding from the opening between the buttocks (anus). You have thin, pencil-like  stools. Get help right away if: You have a fever and your symptoms suddenly get worse. You leak stool or have blood in your stool. Your abdomen is bloated. You have severe pain in your abdomen. You feel dizzy or you faint. Summary Constipation is when a person has fewer than three bowel movements in a week, has difficulty having a bowel movement, or has stools (feces) that are dry, hard, or larger than normal. Eat foods that have a lot of fiber, such as beans, whole grains, and fresh fruits and vegetables. Drink enough fluid to keep your urine pale yellow. Take over-the-counter and prescription medicines only as told by your health care provider. This includes any fiber supplements. This information is not intended to replace advice given to you by your health care provider. Make sure you discuss any questions you have with your health care provider. Document Revised: 08/15/2019 Document Reviewed: 08/15/2019 Elsevier Patient Education  [employer].

## 2021-10-20 ENCOUNTER — Encounter: Payer: Self-pay | Admitting: Surgery

## 2021-10-20 ENCOUNTER — Telehealth: Payer: Self-pay

## 2021-10-20 ENCOUNTER — Other Ambulatory Visit: Payer: Self-pay

## 2021-10-20 DIAGNOSIS — Z86010 Personal history of colonic polyps: Secondary | ICD-10-CM

## 2021-10-20 MED ORDER — PEG 3350-KCL-NA BICARB-NACL 420 G PO SOLR: 4000.0000 mL | Freq: Once | ORAL | 0 refills | Status: AC

## 2021-10-20 NOTE — Progress Notes (Signed)
Gastroenterology Pre-Procedure Review  Request Date: 11/04/2021 Requesting Physician: Dr.anna  PATIENT REVIEW QUESTIONS: The patient responded to the following health history questions as indicated:    1. Are you having any GI issues? no 2. Do you have a personal history of Polyps? yes (last colonoscopy) 3. Do you have a family history of Colon Cancer or Polyps? yes (sister) 4. Diabetes Mellitus? no 5. Joint replacements in the past 12 months?no 6. Major health problems in the past 3 months?no 7. Any artificial heart valves, MVP, or defibrillator?no    MEDICATIONS & ALLERGIES:    Patient reports the following regarding taking any anticoagulation/antiplatelet therapy:   Plavix, Coumadin, Eliquis, Xarelto, Lovenox, Pradaxa, Brilinta, or Effient? no Aspirin? Yes asprin 81 mg  Patient confirms/reports the following medications:  Current Outpatient Medications  Medication Sig Dispense Refill   allopurinol (ZYLOPRIM) 300 MG tablet TAKE 1 TABLET(300 MG) BY MOUTH DAILY AT NIGHT 90 tablet 3   amLODipine (NORVASC) 10 MG tablet Take 1 tablet (10 mg total) by mouth daily. 30 tablet 1   Ascorbic Acid (VITAMIN C) 1000 MG tablet Take 1,000 mg by mouth daily.     aspirin EC 81 MG tablet Take 162 mg by mouth 2 (two) times daily.     atorvastatin (LIPITOR) 20 MG tablet TAKE 1 TABLET(20 MG) BY MOUTH DAILY 90 tablet 3   bisoprolol (ZEBETA) 5 MG tablet TAKE 1 AND 1/2 TABLETS(7.5 MG) BY MOUTH EVERY DAY 135 tablet 3   calcium carbonate (TUMS EX) 750 MG chewable tablet Chew 1 tablet by mouth every 2 (two) hours as needed for heartburn.     Calcium-Magnesium-Zinc 1000-400-15 MG TABS Take 1 tablet by mouth daily.     hydrochlorothiazide (HYDRODIURIL) 25 MG tablet TAKE 1 TABLET(25 MG) BY MOUTH DAILY 90 tablet 0   lisinopril (ZESTRIL) 40 MG tablet TAKE 1 TABLET(40 MG) BY MOUTH DAILY 90 tablet 1   Multiple Vitamin (MULTIVITAMIN) tablet Take 1 tablet by mouth daily.     Omega-3 Fatty Acids (FISH OIL PO) Take 1  capsule by mouth daily.      pantoprazole (PROTONIX) 40 MG tablet Take 1 tablet (40 mg total) by mouth daily. 30 tablet 1   potassium chloride (K-DUR) 10 MEQ tablet Take 10 mEq by mouth daily as needed. for weight gain >2 lbs in 24 hr, >5 lb in 1 week, and leg swelling.     torsemide (DEMADEX) 20 MG tablet Take 1 tablet (20 mg total) by mouth daily as needed (weight gain or swelling). 30 tablet 5   VITAMIN E PO Take 1 capsule by mouth daily.     No current facility-administered medications for this visit.    Patient confirms/reports the following allergies:  No Known Allergies  No orders of the defined types were placed in this encounter.   AUTHORIZATION INFORMATION Primary Insurance: 1D#: Group #:  Secondary Insurance: 1D#: Group #:  SCHEDULE INFORMATION: Date: 11/04/2021 Time: Location: [facility]

## 2021-10-20 NOTE — Progress Notes (Signed)
°10/20/2021 ° °Reason for Visit:  RUQ pain ° °History of Present Illness: °Theadore I Morais is a 80 y.o. male presenting for evaluation of RUQ pain.  He presented to the ED on 09/30/21 with an episode of RUQ pain that resolved while he was in the ER.  The pain was not associated with po intake and did not have any nausea or vomiting.  The pain was not constant but crampy on/off type of pain that lasted for about 12 hours.  The patient has a history of constipation and reports that his stools are pencil like thin.  Denies any blood in the stool or abdominal distention.  He is not sure, but feels that perhaps the pain got better after having a bowel movement.  In the ER, KUB did not show any small bowel distention, and U/S was also done which did not show any cholelithiasis.  The gallbladder was contracted and had some wall thickening of 5.7 mm.  Labwork showed AST 59, ALT 74, but normal total bili of 1.1 and alk phos of 62.  WBC was also normal to 9.3.  As his pain resolved in the ER, he was discharged home with follow up. ° °He had a colonoscopy in 01/2018 with Dr. Skulskie which showed a polyp at the hepatic flexure and melanosis in the colon. ° °The patient has not had any similar pain before, and since his ER visit he has not had any further pain either. ° °Past Medical History: °Past Medical History:  °Diagnosis Date  ° DDD (degenerative disc disease)   ° with epidural injection  ° Gastritis   ° H pylori, partial tx.  EGD 04/2002  ° Gout   ° History of skin cancer   ° Hyperlipidemia   ° Hypertension   ° some white coat component- better at home  ° Stroke (HCC)   ° TIA (transient ischemic attack)   °  ° °Past Surgical History: °Past Surgical History:  °Procedure Laterality Date  ° COLONOSCOPY WITH PROPOFOL N/A 01/16/2018  ° Procedure: COLONOSCOPY WITH PROPOFOL;  Surgeon: Skulskie, Martin U, MD;  Location: [facility] ENDOSCOPY;  Service: Endoscopy;  Laterality: N/A;  ° HEMORRHOID SURGERY  1997  ° LOOP RECORDER INSERTION N/A  12/21/2016  ° Procedure: Loop Recorder Insertion;  Surgeon: Steven C Klein, MD;  Location: [facility] INVASIVE CV LAB;  Service: Cardiovascular;  Laterality: N/A;  ° TEE WITHOUT CARDIOVERSION N/A 12/21/2016  ° Procedure: TRANSESOPHAGEAL ECHOCARDIOGRAM (TEE);  Surgeon: Christopher End, MD;  Location: [facility] ORS;  Service: Cardiovascular;  Laterality: N/A;  ° ° °Home Medications: °Prior to Admission medications   °Medication Sig Start Date End Date Taking? Authorizing Provider  °allopurinol (ZYLOPRIM) 300 MG tablet TAKE 1 TABLET(300 MG) BY MOUTH DAILY AT NIGHT 08/21/20  Yes Tower, Marne A, MD  °amLODipine (NORVASC) 10 MG tablet Take 1 tablet (10 mg total) by mouth daily. 11/07/20 11/07/21 Yes Ward, Kristen N, DO  °Ascorbic Acid (VITAMIN C) 1000 MG tablet Take 1,000 mg by mouth daily.   Yes [provider]  °aspirin EC 81 MG tablet Take 162 mg by mouth 2 (two) times daily.   Yes [provider]  °atorvastatin (LIPITOR) 20 MG tablet TAKE 1 TABLET(20 MG) BY MOUTH DAILY 08/21/20  Yes Tower, Marne A, MD  °bisoprolol (ZEBETA) 5 MG tablet TAKE 1 AND 1/2 TABLETS(7.5 MG) BY MOUTH EVERY DAY 08/21/20  Yes Tower, Marne A, MD  °calcium carbonate (TUMS EX) 750 MG chewable tablet Chew 1 tablet by mouth every 2 (two)   two) hours as needed for heartburn.   Yes [provider]  Calcium-Magnesium-Zinc 1000-400-15 MG TABS Take 1 tablet by mouth daily.   Yes [provider]  hydrochlorothiazide (HYDRODIURIL) 25 MG tablet TAKE 1 TABLET(25 MG) BY MOUTH DAILY 09/16/21  Yes End, Harrell Gave, MD  lisinopril (ZESTRIL) 40 MG tablet TAKE 1 TABLET(40 MG) BY MOUTH DAILY 06/23/21  Yes End, Harrell Gave, MD  Multiple Vitamin (MULTIVITAMIN) tablet Take 1 tablet by mouth daily.   Yes [provider]  Omega-3 Fatty Acids (FISH OIL PO) Take 1 capsule by mouth daily.    Yes [provider]  pantoprazole (PROTONIX) 40 MG tablet Take 1 tablet (40 mg total) by mouth daily. 09/30/21 09/30/22 Yes Nena Polio, MD   potassium chloride (K-DUR) 10 MEQ tablet Take 10 mEq by mouth daily as needed. for weight gain >2 lbs in 24 hr, >5 lb in 1 week, and leg swelling.   Yes [provider]  torsemide (DEMADEX) 20 MG tablet Take 1 tablet (20 mg total) by mouth daily as needed (weight gain or swelling). 01/14/21  Yes End, Harrell Gave, MD  VITAMIN E PO Take 1 capsule by mouth daily.   Yes [provider]  polyethylene glycol-electrolytes (NULYTELY) 420 g solution Take 4,000 mLs by mouth once for 1 dose. 10/20/21 10/20/21  Jonathon Bellows, MD    Allergies: No Known Allergies  Social History:  reports that he quit smoking about 50 years ago. His smoking use included cigarettes. He has a 8.00 pack-year smoking history. He has never used smokeless tobacco. He reports that he does not currently use alcohol after a past usage of about 7.0 standard drinks per week. He reports that he does not use drugs.   Family History: Family History  Problem Relation Age of Onset   Other Mother        brain tumor   Skin cancer Mother        suspect melanoma   Heart attack Father 58   Colon cancer Sister     Review of Systems: Review of Systems  Constitutional:  Negative for chills and fever.  HENT:  Negative for hearing loss.   Respiratory:  Negative for shortness of breath.   Cardiovascular:  Negative for chest pain.  Gastrointestinal:  Positive for abdominal pain (RUQ pain) and constipation. Negative for nausea and vomiting.  Genitourinary:  Negative for dysuria.  Musculoskeletal:  Negative for myalgias.  Skin:  Negative for rash.  Neurological:  Negative for dizziness.  Psychiatric/Behavioral:  Negative for depression.    Physical Exam BP 136/78    Pulse 63    Temp 98 F (36.7 C) (Oral)    Ht 6' 1" (1.854 m)    Wt 183 lb (83 kg)    SpO2 93%    BMI 24.14 kg/m  CONSTITUTIONAL: No acute distress, well nourished. HEENT:  Normocephalic, atraumatic, extraocular motion intact. NECK: Trachea is midline, and  there is no jugular venous distension.  RESPIRATORY:  Lungs are clear, and breath sounds are equal bilaterally. Normal respiratory effort without pathologic use of accessory muscles. CARDIOVASCULAR: Heart is regular without murmurs, gallops, or rubs. GI: The abdomen is soft, non-distended, with no tenderness to palpation.  No palpable hernias or masses.  MUSCULOSKELETAL:  Normal muscle strength and tone in all four extremities.  No peripheral edema or cyanosis. SKIN: Skin turgor is normal. There are no pathologic skin lesions.  NEUROLOGIC:  Motor and sensation is grossly normal.  Cranial nerves are grossly intact. PSYCH:  Alert and oriented to person, place and time. Affect is normal.  Laboratory Analysis: Labs from 09/30/21: Na 138, K 3.7, Cl 99, CO2 30, BUN 17, Cr 0.89.  Total bili 1.1, AST 59, ALT 74, Alk Phos 62, Lipase 39.  WBC 9.3, Hgb 15.2, Hct 43.9, Plt 143.  Imaging: U/S 09/30/21: IMPRESSION: 1. Gallbladder wall thickening without evidence of cholelithiasis or acute cholecystitis. 2. Large simple hepatic and simple right renal cysts.  Assessment and Plan: This is a 81 y.o. male with RUQ  --Discussed with the patient that the RUQ pain does not necessarily match biliary colic symptoms.  He has not had any prior episodes, was not related to food, resolved after bowel movement, and the pain was crampy in nature.  His U/S did not show any cholelithiasis, though showed some wall thickening, though the gallbladder was contracted.   --Discussed with him that an area of concern is the thinner caliber of his stools.  He reports this has been recent.  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  His last colonoscopy was in 2019 and only showed one polyp.  I think it would be prudent to refer him to GI for a colonoscopy to evaluate for the stool caliber change.  If he has been having any worse constipation, this could result in crampy  abdominal pain as well. --If colonoscopy is negative, then we could possibly re-evaluate his gallbladder with a HIDA scan.   --He would follow up with [place] after colonoscopy if further studies/tests are needed to evaluate the etiology for his pain episode.  Recommended that he try low fat diet and be more watchful for any possible symptom recurrence.  I spent 45 minutes dedicated to the care of this patient on the date of this encounter to include pre-visit review of records, face-to-face time with the patient discussing diagnosis and management, and any post-visit coordination of care.   Melvyn Neth, [facility]

## 2021-10-20 NOTE — Telephone Encounter (Signed)
CALLED PATIENT NO ANSWER LEFT VOICEMAIL FOR A CALL BACK ? ?

## 2021-11-09 ENCOUNTER — Other Ambulatory Visit: Payer: Self-pay | Admitting: Family Medicine

## 2021-11-09 NOTE — Telephone Encounter (Signed)
Called pt to get scheduled for CPE. Lvmtcb

## 2021-11-09 NOTE — Telephone Encounter (Signed)
Per Dr. Glori Bickers pt needs an f/u or CPE/ AWV appt and then route back to me to refill

## 2021-11-11 ENCOUNTER — Encounter: Payer: Self-pay | Admitting: Family Medicine

## 2021-11-11 NOTE — Telephone Encounter (Signed)
2nd attempt  LMTCB to schedule a CPE  Mychart sent

## 2021-11-25 ENCOUNTER — Other Ambulatory Visit: Payer: Self-pay | Admitting: Family Medicine

## 2021-11-27 ENCOUNTER — Telehealth: Payer: Self-pay

## 2021-11-27 NOTE — Telephone Encounter (Signed)
Error

## 2021-11-27 NOTE — Telephone Encounter (Signed)
Patient states his wife test positive for COVID last night. He wants to know if he needs to reschedule his colonoscopy. Informed patient he did and reschedule it to 12/10/21. Called ENDO and informed Vickie of the change

## 2021-12-07 ENCOUNTER — Ambulatory Visit: Payer: PPO | Admitting: Surgery

## 2021-12-09 ENCOUNTER — Ambulatory Visit: Payer: PPO | Admitting: Internal Medicine

## 2021-12-09 ENCOUNTER — Other Ambulatory Visit
Admission: RE | Admit: 2021-12-09 | Discharge: 2021-12-09 | Disposition: A | Discharge status: Home / Self Care | Payer: PPO | Source: Ambulatory Visit | Attending: Internal Medicine | Admitting: Internal Medicine

## 2021-12-09 ENCOUNTER — Encounter: Payer: Self-pay | Admitting: Internal Medicine

## 2021-12-09 ENCOUNTER — Other Ambulatory Visit: Payer: Self-pay

## 2021-12-09 VITALS — BP 130/74 | HR 57 | Ht 73.0 in | Wt 185.0 lb

## 2021-12-09 DIAGNOSIS — R7989 Other specified abnormal findings of blood chemistry: Secondary | ICD-10-CM

## 2021-12-09 DIAGNOSIS — I5032 Chronic diastolic (congestive) heart failure: Secondary | ICD-10-CM | POA: Insufficient documentation

## 2021-12-09 DIAGNOSIS — I10 Essential (primary) hypertension: Secondary | ICD-10-CM | POA: Diagnosis not present

## 2021-12-09 DIAGNOSIS — E785 Hyperlipidemia, unspecified: Secondary | ICD-10-CM

## 2021-12-09 DIAGNOSIS — Z8673 Personal history of transient ischemic attack (TIA), and cerebral infarction without residual deficits: Secondary | ICD-10-CM

## 2021-12-09 LAB — COMPREHENSIVE METABOLIC PANEL
ALT: 34 U/L (ref 0–44)
AST: 27 U/L (ref 15–41)
Albumin: 3.9 g/dL (ref 3.5–5.0)
Alkaline Phosphatase: 52 U/L (ref 38–126)
Anion gap: 5 (ref 5–15)
BUN: 16 mg/dL (ref 8–23)
CO2: 29 mmol/L (ref 22–32)
Calcium: 8.8 mg/dL — ABNORMAL LOW (ref 8.9–10.3)
Chloride: 104 mmol/L (ref 98–111)
Creatinine, Ser: 0.83 mg/dL (ref 0.61–1.24)
GFR, Estimated: >60 mL/min (ref >60)
Glucose, Bld: 138 mg/dL — ABNORMAL HIGH (ref 70–99)
Potassium: 3.8 mmol/L (ref 3.5–5.1)
Sodium: 138 mmol/L (ref 135–145)
Total Bilirubin: 0.6 mg/dL (ref 0.3–1.2)
Total Protein: 6.7 g/dL (ref 6.5–8.1)

## 2021-12-09 LAB — LIPID PANEL
Cholesterol: 118 mg/dL (ref 0–200)
HDL: 39 mg/dL — ABNORMAL LOW (ref >40)
LDL Cholesterol: 41 mg/dL (ref 0–99)
Total CHOL/HDL Ratio: 3 RATIO
Triglycerides: 191 mg/dL — ABNORMAL HIGH (ref <150)
VLDL: 38 mg/dL (ref 0–40)

## 2021-12-09 NOTE — Patient Instructions (Signed)
Medication Instructions:  ? ?Your physician recommends that you continue on your current medications as directed. Please refer to the Current Medication list given to you today. ? ?Please call our office to let us know if you are taking Amlodipine when you get home. ? ?*If you need a refill on your cardiac medications before your next appointment, please call your pharmacy* ? ? ?Lab Work: ? ?Today at the medical mall at [facility]: CMET, Lipid panel ? ?If you have labs (blood work) drawn today and your tests are completely normal, you will receive your results only by: ?MyChart Message (if you have MyChart) OR ?A paper copy in the mail ?If you have any lab test that is abnormal or we need to change your treatment, we will call you to review the results. ? ? ?Testing/Procedures: ? ?None ordered ? ? ?Follow-Up: ?At [facility], you and your health needs are our priority.  As part of our continuing mission to provide you with exceptional heart care, we have created designated Provider Care Teams.  These Care Teams include your primary Cardiologist (physician) and Advanced Practice Providers (APPs -  Physician Assistants and Nurse Practitioners) who all work together to provide you with the care you need, when you need it. ? ?We recommend signing up for the patient portal called "MyChart".  Sign up information is provided on this After Visit Summary.  MyChart is used to connect with patients for Virtual Visits (Telemedicine).  Patients are able to view lab/test results, encounter notes, upcoming appointments, etc.  Non-urgent messages can be sent to your provider as well.   ?To learn more about what you can do with MyChart, go to [email or web address].   ? ?Your next appointment:   ?6 month(s) ? ?The format for your next appointment:   ?In Person ? ?Provider:   ?You may see Nelva Bush, MD or one of the following Advanced Practice Providers on your designated Care Team:   ?Murray Hodgkins, NP ?Christell Faith,  PA-C ?Cadence Kathlen Mody, PA-C ?

## 2021-12-09 NOTE — Progress Notes (Signed)
? ?Follow-up Outpatient Visit ?Date: 12/09/2021 ? ?Primary Care Provider: ?Tower, Wynelle Fanny, MD ?[address] ?[place] [address] ? ?Chief Complaint: Follow-up HFpEF, hypertension, and stroke ? ?HPI:  Mr. Hudler is a 81 y.o. male with history of stroke, HFpEF, hypertension, and hyperlipidemia, who presents for follow-up of hypertension and HFpEF.  I last saw him in 06/2021, which time Mr. Whack was feeling well.  Home blood pressure readings had improved and were typically in the normal range.  No medication changes or additional testing were pursued.  He was seen in the ED in 09/2021 with right upper quadrant pain.  Mild transaminitis was noted.  Right upper quadrant ultrasound was notable for gallbladder wall thickening without evidence of cholelithiasis or acute cholecystitis.  Simple hepatic and right renal cysts were noted. ? ?Today, Mr. Gracey reports that he has been doing well.  He denies chest pain, shortness of breath, palpitations, lightheadedness, and edema.  He is no longer taking torsemide.  He is uncertain if he remains on amlodipine.  Blood pressure readings at home over the last month have been normal.  He walks on the treadmill and rides a stationary bike 20 to 30 minutes a day most days.  He is planning to travel to [place] later this month.  He is scheduled for screening colonoscopy tomorrow. ? ?-------------------------------------------------------------------------------------------------- ? ?Cardiovascular History & Procedures: ?Cardiovascular Problems: ?Cryptogenic stroke ?Mildly dilated ascending aorta ?HFpEF ?  ?Risk Factors: ?Hypertension, hyperlipidemia, male gender, and age greater than 69 ?  ?Cath/PCI: ?None ?  ?CV Surgery: ?None ?  ?EP Procedures and Devices: ?Implantable loop recorder (12/21/16) ?24-hour Holter monitor (10/20/16, [place]): Predominant rhythm was sinus with an average rate of 63 bpm (range 46-93 bpm). Rare supraventricular and ventricular ectopy was identified.  There was one episode of nonsustained ventricular tachycardia lasting 4 beats. No sustained arrhythmias, including atrial fibrillation/flutter) are evident. ?  ?Non-Invasive Evaluation(s): ?Exercise tolerance test (03/27/2020): Low risk study without evidence of ischemia.  Rare isolated PVCs noted before and during stress. ?TTE (02/05/2020, [facility]): Normal LV size with mild LVH.  LVEF 55-60% with grade 2 diastolic dysfunction and elevated filling pressures.  Normal RV size and function.  Moderate left and mild right atrial enlargement.  Mild to moderate mitral regurgitation.  Mild tricuspid regurgitation.  Aortic sclerosis.  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  ?TEE (12/21/16): Normal LV size with mild LVH. LVEF 55-65%. Mild aortic regurgitation. Mildly dilated ascending aorta. Mild mitral regurgitation. No left or right atrial thrombus. No PFO or ASD; negative ?TTE (12/02/16): Normal LV size with mild LVH. LVEF 55-60% with normal wall motion. Normal diastolic function. Moderate AI. Mildly dilated ascending aorta measuring 3.9 cm. Mild MR. Normal RV size and function. ?Transthoracic echocardiogram (10/2016; [place]): Reportedly normal, though no images or reports is available for review. ? ?Recent CV Pertinent Labs: ?Lab Results  ?Component Value Date  ? CHOL 128 08/18/2020  ? HDL 41.40 08/18/2020  ? [place] 25 01/10/2017  ? LDLDIRECT 49.0 08/18/2020  ? TRIG 262.0 (H) 08/18/2020  ? CHOLHDL 3 08/18/2020  ? INR 1.1 12/20/2018  ? K 3.7 09/30/2021  ? K 3.9 05/15/2009  ? BUN 17 09/30/2021  ? BUN 12 05/15/2009  ? CREATININE 0.89 09/30/2021  ? CREATININE 0.8 05/15/2009  ? ? ?Past medical and surgical history were reviewed and updated in EPIC. ? ?Current Meds  ?Medication Sig  ? allopurinol (ZYLOPRIM) 300 MG tablet TAKE 1 TABLET(300 MG) BY MOUTH DAILY AT NIGHT  ?  Ascorbic Acid (VITAMIN C) 1000 MG tablet Take 1,000 mg by mouth daily.  ? aspirin EC 81 MG tablet Take 162 mg by mouth 2  (two) times daily.  ? atorvastatin (LIPITOR) 20 MG tablet TAKE 1 TABLET(20 MG) BY MOUTH DAILY  ? calcium carbonate (TUMS EX) 750 MG chewable tablet Chew 1 tablet by mouth every 2 (two) hours as needed for heartburn.  ? Calcium-Magnesium-Zinc 1000-400-15 MG TABS Take 1 tablet by mouth daily.  ? hydrochlorothiazide (HYDRODIURIL) 25 MG tablet TAKE 1 TABLET(25 MG) BY MOUTH DAILY  ? lisinopril (ZESTRIL) 40 MG tablet TAKE 1 TABLET(40 MG) BY MOUTH DAILY  ? Omega-3 Fatty Acids (FISH OIL PO) Take 1 capsule by mouth daily.   ? VITAMIN E PO Take 1 capsule by mouth daily.  ? ? ?Allergies: Patient has no known allergies. ? ?Social History  ? ?Tobacco Use  ? Smoking status: Former  ?  Packs/day: 1.00  ?  Years: 8.00  ?  Pack years: 8.00  ?  Types: Cigarettes  ?  Quit date: 73  ?  Years since quitting: 50.1  ? Smokeless tobacco: Never  ?Vaping Use  ? Vaping Use: Never used  ?Substance Use Topics  ? Alcohol use: Yes  ?  Alcohol/week: 7.0 standard drinks  ?  Types: 7 Glasses of wine per week  ?  Comment: weekly-with dinner; 12/09/2021 "sometimes"  ? Drug use: No  ? ? ?Family History  ?Problem Relation Age of Onset  ? Other Mother   ?     brain tumor  ? Skin cancer Mother   ?     suspect melanoma  ? Heart attack Father 43  ? Colon cancer Sister   ? ? ?Review of Systems: ?A 12-system review of systems was performed and was negative except as noted in the HPI. ? ?-------------------------------------------------------------------------------------------------- ? ?Physical Exam: ?BP 130/74 (BP Location: Left Arm, Patient Position: Sitting, Cuff Size: Large)   Pulse (!) 57   Ht 6\' 1"  (1.854 m)   Wt 185 lb (83.9 kg)   SpO2 98%   BMI 24.41 kg/m?  ? ?General:  NAD. ?Neck: No JVD or HJR. ?Lungs: Clear to auscultation bilaterally without wheezes or crackles. ?Heart: Bradycardic but regular without murmurs, rubs, or gallops. ?Abdomen: Soft, nontender, nondistended. ?Extremities: No lower extremity edema. ? ?EKG: Sinus bradycardia with  borderline poor R wave progression.  No significant change from prior tracing on 06/11/2021. ? ?Lab Results  ?Component Value Date  ? WBC 9.3 09/30/2021  ? HGB 15.2 09/30/2021  ? HCT 43.9 09/30/2021  ? MCV 92.8 09/30/2021  ? PLT 143 (L) 09/30/2021  ? ? ?Lab Results  ?Component Value Date  ? NA 138 09/30/2021  ? K 3.7 09/30/2021  ? CL 99 09/30/2021  ? CO2 30 09/30/2021  ? BUN 17 09/30/2021  ? CREATININE 0.89 09/30/2021  ? GLUCOSE 124 (H) 09/30/2021  ? ALT 74 (H) 09/30/2021  ? ? ?Lab Results  ?Component Value Date  ? CHOL 128 08/18/2020  ? HDL 41.40 08/18/2020  ? [place] 25 01/10/2017  ? LDLDIRECT 49.0 08/18/2020  ? TRIG 262.0 (H) 08/18/2020  ? CHOLHDL 3 08/18/2020  ? ? ?-------------------------------------------------------------------------------------------------- ? ?ASSESSMENT AND PLAN: ?HFpEF: ?Mr. Grape does not have any dyspnea or edema and is no longer taking torsemide.  This is consistent with NYHA class I symptoms.  Continue blood pressure control.  No further intervention at this time. ? ?History of stroke: ?No new focal neurologic deficits reported.  Continue aspirin, atorvastatin, and blood  pressure control for secondary prevention.  I will check a CMP and lipid panel today. ? ?Hypertension: ?Blood pressure well controlled today and at home.  Continue current medications.  Mr. Gallaga is uncertain if he has been taking amlodipine.  I asked him to check his medications at home and to let us know if he is on this medicine or not. ? ?Hyperlipidemia and abnormal liver function tests: ?Most recent lipid panel in 08/2020 showed excellent LDL control and mildly elevated triglycerides.  LFTs in 12/22 at the time of ED visit for right upper quadrant pain were mildly abnormal.  We will plan to repeat a CMP and lipid panel today.  As long as LDL remains less than 70 and LFTs have not worsened, we will continue with atorvastatin 20 mg daily. ? ?Follow-up: Return to clinic in 6 months. ? ?Nelva Bush,  MD ?12/09/2021 ?9:25 AM ? ?

## 2021-12-10 ENCOUNTER — Other Ambulatory Visit: Payer: Self-pay

## 2021-12-10 ENCOUNTER — Ambulatory Visit: Payer: PPO | Admitting: Anesthesiology

## 2021-12-10 ENCOUNTER — Ambulatory Visit
Admission: RE | Admit: 2021-12-10 | Discharge: 2021-12-10 | Disposition: A | Discharge status: Home / Self Care | Payer: PPO | Attending: Gastroenterology | Admitting: Gastroenterology

## 2021-12-10 ENCOUNTER — Encounter
Admission: RE | Disposition: A | Discharge status: Home / Self Care | Payer: Self-pay | Source: Home / Self Care | Attending: Gastroenterology

## 2021-12-10 ENCOUNTER — Encounter: Payer: Self-pay | Admitting: Gastroenterology

## 2021-12-10 DIAGNOSIS — Z86010 Personal history of colonic polyps: Secondary | ICD-10-CM

## 2021-12-10 DIAGNOSIS — D122 Benign neoplasm of ascending colon: Secondary | ICD-10-CM | POA: Diagnosis not present

## 2021-12-10 DIAGNOSIS — Z1211 Encounter for screening for malignant neoplasm of colon: Secondary | ICD-10-CM | POA: Diagnosis not present

## 2021-12-10 DIAGNOSIS — D126 Benign neoplasm of colon, unspecified: Secondary | ICD-10-CM | POA: Diagnosis not present

## 2021-12-10 DIAGNOSIS — K635 Polyp of colon: Secondary | ICD-10-CM | POA: Diagnosis not present

## 2021-12-10 DIAGNOSIS — E785 Hyperlipidemia, unspecified: Secondary | ICD-10-CM | POA: Diagnosis not present

## 2021-12-10 DIAGNOSIS — I10 Essential (primary) hypertension: Secondary | ICD-10-CM | POA: Diagnosis not present

## 2021-12-10 DIAGNOSIS — Z87891 Personal history of nicotine dependence: Secondary | ICD-10-CM | POA: Insufficient documentation

## 2021-12-10 DIAGNOSIS — Z09 Encounter for follow-up examination after completed treatment for conditions other than malignant neoplasm: Secondary | ICD-10-CM | POA: Insufficient documentation

## 2021-12-10 HISTORY — PX: COLONOSCOPY WITH PROPOFOL: SHX5780

## 2021-12-10 SURGERY — COLONOSCOPY WITH PROPOFOL
Anesthesia: General

## 2021-12-10 MED ORDER — PROPOFOL 500 MG/50ML IV EMUL
INTRAVENOUS | Status: DC | PRN
Start: 2021-12-10 — End: 2021-12-10
  Administered 2021-12-10: 140 ug/kg/min via INTRAVENOUS

## 2021-12-10 MED ORDER — SODIUM CHLORIDE 0.9 % IV SOLN: INTRAVENOUS | Status: DC

## 2021-12-10 MED ORDER — PROPOFOL 10 MG/ML IV BOLUS
INTRAVENOUS | Status: DC | PRN
  Administered 2021-12-10: 80 mg via INTRAVENOUS

## 2021-12-10 NOTE — Anesthesia Preprocedure Evaluation (Signed)
Anesthesia Evaluation  ?Patient identified by MRN, date of birth, ID band ?Patient awake ? ? ? ?Reviewed: ?Allergy & Precautions, NPO status , Patient's Chart, lab work & pertinent test results ? ?Airway ?Mallampati: II ? ?TM Distance: >3 FB ?Neck ROM: Full ? ? ? Dental ? ?(+) Teeth Intact ?  ?Pulmonary ?neg pulmonary ROS, former smoker,  ?  ?Pulmonary exam normal ?breath sounds clear to auscultation ? ? ? ? ? ? Cardiovascular ?Exercise Tolerance: Good ?hypertension, Pt. on medications ?negative cardio ROS ?Normal cardiovascular exam ?Rhythm:Regular Rate:Normal ? ? ?  ?Neuro/Psych ?Anxiety TIAnegative neurological ROS ? negative psych ROS  ? GI/Hepatic ?negative GI ROS, Neg liver ROS,   ?Endo/Other  ?negative endocrine ROS ? Renal/GU ?negative Renal ROS  ?negative genitourinary ?  ?Musculoskeletal ? ?(+) Arthritis ,  ? Abdominal ?Normal abdominal exam  (+)   ?Peds ?negative pediatric ROS ?(+)  Hematology ?negative hematology ROS ?(+)   ?Anesthesia Other Findings ?Past Medical History: ?No date: DDD (degenerative disc disease) ?    Comment:  with epidural injection ?No date: Gastritis ?    Comment:  H pylori, partial tx.  EGD 04/2002 ?No date: Gout ?No date: History of skin cancer ?No date: Hyperlipidemia ?No date: Hypertension ?    Comment:  some white coat component- better at home ?No date: Stroke [facility]) ?No date: TIA (transient ischemic attack) ? ?Past Surgical History: ?01/16/2018: COLONOSCOPY WITH PROPOFOL; N/A ?    Comment:  Procedure: COLONOSCOPY WITH PROPOFOL;  Surgeon:  ?             Lollie Sails, MD;  Location: [facility] ENDOSCOPY;   ?             Service: Endoscopy;  Laterality: N/A; ?1997: HEMORRHOID SURGERY ?12/21/2016: LOOP RECORDER INSERTION; N/A ?    Comment:  Procedure: Loop Recorder Insertion;  Surgeon: Revonda Standard  ?             Caryl Comes, MD;  Location: [facility];  Service:  ?             Cardiovascular;  Laterality: N/A; ?12/21/2016: TEE WITHOUT CARDIOVERSION;  N/A ?    Comment:  Procedure: TRANSESOPHAGEAL ECHOCARDIOGRAM (TEE);   ?             Surgeon: Nelva Bush, MD;  Location: [facility] ORS;   ?             Service: Cardiovascular;  Laterality: N/A; ? ?BMI   ? Body Mass Index: 24.41 kg/m?  ?  ? ? Reproductive/Obstetrics ?negative OB ROS ? ?  ? ? ? ? ? ? ? ? ? ? ? ? ? ?  ?  ? ? ? ? ? ? ? ? ?Anesthesia Physical ?Anesthesia Plan ? ?ASA: 3 ? ?Anesthesia Plan: General  ? ?Post-op Pain Management:   ? ?Induction: Intravenous ? ?PONV Risk Score and Plan: Propofol infusion and TIVA ? ?Airway Management Planned: Natural Airway and Nasal Cannula ? ?Additional Equipment:  ? ?Intra-op Plan:  ? ?Post-operative Plan:  ? ?Informed Consent: I have reviewed the patients History and Physical, chart, labs and discussed the procedure including the risks, benefits and alternatives for the proposed anesthesia with the patient or authorized representative who has indicated his/her understanding and acceptance.  ? ? ? ?Dental Advisory Given ? ?Plan Discussed with: CRNA and Surgeon ? ?Anesthesia Plan Comments:   ? ? ? ? ? ? ?Anesthesia Quick Evaluation ? ?

## 2021-12-10 NOTE — Transfer of Care (Signed)
Immediate Anesthesia Transfer of Care Note ? ?Patient: Jeffrey [name] ? ?Procedure(s) Performed: COLONOSCOPY WITH PROPOFOL ? ?Patient Location: PACU ? ?Anesthesia Type:General ? ?Level of Consciousness: awake, alert  and oriented ? ?Airway & Oxygen Therapy: Patient Spontanous Breathing ? ?Post-op Assessment: Report given to RN and Post -op Vital signs reviewed and stable ? ?Post vital signs: Reviewed and stable ? ?Last Vitals:  ?Vitals Value Taken Time  ?BP    ?Temp    ?Pulse    ?Resp    ?SpO2    ? ? ?Last Pain:  ?Vitals:  ? 12/10/21 0957  ?TempSrc: Temporal  ?PainSc: 0-No pain  ?   ? ?  ? ?Complications: No notable events documented. ?

## 2021-12-10 NOTE — Op Note (Signed)
[facility] ?Gastroenterology ?Patient Name: Jeffrey [name] ?Procedure Date: 12/10/2021 11:06 AM ?MRN: [id number] ?Account #: [id number] ?Date of Birth: 1941/06/08 ?Admit Type: Outpatient ?Age: 81 ?Room: [facility] ENDO ROOM 4 ?Gender: Male ?Note Status: Finalized ?Instrument Name: Colonoscope [id number] ?Procedure:             Colonoscopy ?Indications:           Surveillance: Personal history of adenomatous polyps  ?                       on last colonoscopy > 3 years ago ?Providers:             Jonathon Bellows MD, MD ?Referring MD:          Wynelle Fanny. Tower (Referring MD) ?Medicines:             Monitored Anesthesia Care ?Complications:         No immediate complications. ?Procedure:             Pre-Anesthesia Assessment: ?                       - Prior to the procedure, a History and Physical was  ?                       performed, and patient medications, allergies and  ?                       sensitivities were reviewed. The patient's tolerance  ?                       of previous anesthesia was reviewed. ?                       - The risks and benefits of the procedure and the  ?                       sedation options and risks were discussed with the  ?                       patient. All questions were answered and informed  ?                       consent was obtained. ?                       - ASA Grade Assessment: II - A patient with mild  ?                       systemic disease. ?                       After obtaining informed consent, the colonoscope was  ?                       passed under direct vision. Throughout the procedure,  ?                       the patient's blood pressure, pulse, and oxygen  ?  saturations were monitored continuously. The  ?                       Colonoscope was introduced through the anus and  ?                       advanced to the the cecum, identified by the  ?                       appendiceal orifice. The colonoscopy was performed  ?                        with ease. The patient tolerated the procedure well.  ?                       The quality of the bowel preparation was poor. ?Findings: ?     The perianal and digital rectal examinations were normal. ?     A 7 mm polyp was found in the ascending colon. The polyp was sessile.  ?     The polyp was removed with a cold snare. Resection and retrieval were  ?     complete. To prevent bleeding after the polypectomy, a hemostatic clip  ?     was successfully placed. There was no bleeding at the end of the  ?     procedure. ?     The exam was otherwise without abnormality on direct and retroflexion  ?     views. ?Impression:            - Preparation of the colon was poor. ?                       - One 7 mm polyp in the ascending colon, removed with  ?                       a cold snare. Resected and retrieved. ?                       - The examination was otherwise normal on direct and  ?                       retroflexion views. ?Recommendation:        - Discharge patient to home (with escort). ?                       - Resume previous diet. ?                       - Continue present medications. ?                       - Await pathology results. ?                       - Repeat colonoscopy in 6 months because the bowel  ?                       preparation was suboptimal. ?Procedure Code(s):     --- Professional --- ?  45385, Colonoscopy, flexible; with removal of  ?                       tumor(s), polyp(s), or other lesion(s) by snare  ?                       technique ?Diagnosis Code(s):     --- Professional --- ?                       Z86.010, Personal history of colonic polyps ?                       K63.5, Polyp of colon ?CPT copyright 2019 American Medical Association. All rights reserved. ?The codes documented in this report are preliminary and upon coder review may  ?be revised to meet current compliance requirements. ?Jonathon Bellows, MD ?Jonathon Bellows MD, MD ?12/10/2021 11:34:06 AM ?This report has  been signed electronically. ?Number of Addenda: 0 ?Note Initiated On: 12/10/2021 11:06 AM ?Scope Withdrawal Time: 0 hours 7 minutes 46 seconds  ?Total Procedure Duration: 0 hours 14 minutes 26 seconds  ?Estimated Blood Loss:  Estimated blood loss: none. ?     [facility] ?

## 2021-12-10 NOTE — H&P (Signed)
?Jeffrey Bellows, MD ?[address] [place], [place], [place], [address] ?[address] [place], [place], [place], [address] ?Phone: [phone]  ?Fax: [phone] ? ?Primary Care Physician:  Tower, Wynelle Fanny, MD ? ? ?Pre-Procedure History & Physical: ?HPI:  Jeffrey [name] is a 81 y.o. male is here for an colonoscopy. ?  ?Past Medical History:  ?Diagnosis Date  ? DDD (degenerative disc disease)   ? with epidural injection  ? Gastritis   ? H pylori, partial tx.  EGD 04/2002  ? Gout   ? History of skin cancer   ? Hyperlipidemia   ? Hypertension   ? some white coat component- better at home  ? Stroke [facility])   ? TIA (transient ischemic attack)   ? ? ?Past Surgical History:  ?Procedure Laterality Date  ? COLONOSCOPY WITH PROPOFOL N/A 01/16/2018  ? Procedure: COLONOSCOPY WITH PROPOFOL;  Surgeon: Lollie Sails, MD;  Location: [facility] ENDOSCOPY;  Service: Endoscopy;  Laterality: N/A;  ? [place]  ? LOOP RECORDER INSERTION N/A 12/21/2016  ? Procedure: Loop Recorder Insertion;  Surgeon: Deboraha Sprang, MD;  Location: [place] CV LAB;  Service: Cardiovascular;  Laterality: N/A;  ? TEE WITHOUT CARDIOVERSION N/A 12/21/2016  ? Procedure: TRANSESOPHAGEAL ECHOCARDIOGRAM (TEE);  Surgeon: Nelva Bush, MD;  Location: [facility] ORS;  Service: Cardiovascular;  Laterality: N/A;  ? ? ?Prior to Admission medications   ?Medication Sig Start Date End Date Taking? Authorizing Provider  ?allopurinol (ZYLOPRIM) 300 MG tablet TAKE 1 TABLET(300 MG) BY MOUTH DAILY AT NIGHT 11/25/21  Yes Tower, Wynelle Fanny, MD  ?amLODipine (NORVASC) 10 MG tablet Take 1 tablet (10 mg total) by mouth daily. 11/07/20  Yes Ward, Delice Bison, DO  ?Ascorbic Acid (VITAMIN C) 1000 MG tablet Take 1,000 mg by mouth daily.   Yes [provider]  ?aspirin EC 81 MG tablet Take 162 mg by mouth 2 (two) times daily.   Yes [provider]  ?atorvastatin (LIPITOR) 20 MG tablet TAKE 1 TABLET(20 MG) BY MOUTH DAILY 08/21/20  Yes Tower, Wynelle Fanny, MD  ?calcium  carbonate (TUMS EX) 750 MG chewable tablet Chew 1 tablet by mouth every 2 (two) hours as needed for heartburn.   Yes [provider]  ?lisinopril (ZESTRIL) 40 MG tablet TAKE 1 TABLET(40 MG) BY MOUTH DAILY 06/23/21  Yes End, Harrell Gave, MD  ?Calcium-Magnesium-Zinc 1000-400-15 MG TABS Take 1 tablet by mouth daily. ?Patient not taking: Reported on 12/10/2021    [provider]  ?hydrochlorothiazide (HYDRODIURIL) 25 MG tablet TAKE 1 TABLET(25 MG) BY MOUTH DAILY ?Patient not taking: Reported on 12/10/2021 09/16/21   End, Harrell Gave, MD  ?Omega-3 Fatty Acids (FISH OIL PO) Take 1 capsule by mouth daily.  ?Patient not taking: Reported on 12/10/2021    [provider]  ?VITAMIN E PO Take 1 capsule by mouth daily. ?Patient not taking: Reported on 12/10/2021    [provider]  ? ? ?Allergies as of 10/20/2021  ? (No Known Allergies)  ? ? ?Family History  ?Problem Relation Age of Onset  ? Other Mother   ?     brain tumor  ? Skin cancer Mother   ?     suspect melanoma  ? Heart attack Father 51  ? Colon cancer Sister   ? ? ?Social History  ? ?Socioeconomic History  ? Marital status: Married  ?  Spouse name: Not on file  ? Number of children: 3  ? Years of education: HS  ? Highest education level:  Not on file  ?Occupational History  ? Occupation: [employer]  ?Tobacco Use  ? Smoking status: Former  ?  Packs/day: 1.00  ?  Years: 8.00  ?  Pack years: 8.00  ?  Types: Cigarettes  ?  Quit date: 61  ?  Years since quitting: 50.1  ? Smokeless tobacco: Never  ?Vaping Use  ? Vaping Use: Never used  ?Substance and Sexual Activity  ? Alcohol use: Yes  ?  Alcohol/week: 7.0 standard drinks  ?  Types: 7 Glasses of wine per week  ?  Comment: weekly-with dinner; 12/09/2021 "sometimes"  ? Drug use: No  ? Sexual activity: Yes  ?Other Topics Concern  ? Not on file  ?Social History Narrative  ? From [place]  ? Drinks 1 caffeine drink a day   ? ?Social Determinants of Health  ? ?Financial Resource Strain:  Not on file  ?Food Insecurity: Not on file  ?Transportation Needs: Not on file  ?Physical Activity: Not on file  ?Stress: Not on file  ?Social Connections: Not on file  ?Intimate Partner Violence: Not on file  ? ? ?Review of Systems: ?See HPI, otherwise negative ROS ? ?Physical Exam: ?BP (!) 144/81   Pulse (!) 55   Temp (!) 96.5 ?F (35.8 ?C) (Temporal)   Resp 16   Ht 6\' 1"  (1.854 m)   Wt 83.9 kg   SpO2 98%   BMI 24.41 kg/m?  ?General:   Alert,  pleasant and cooperative in NAD ?Head:  Normocephalic and atraumatic. ?Neck:  Supple; no masses or thyromegaly. ?Lungs:  Clear throughout to auscultation, normal respiratory effort.    ?Heart:  +S1, +S2, Regular rate and rhythm, No edema. ?Abdomen:  Soft, nontender and nondistended. Normal bowel sounds, without guarding, and without rebound.   ?Neurologic:  Alert and  oriented x4;  grossly normal neurologically. ? ?Impression/Plan: ?Jeffrey [name] is here for an colonoscopy to be performed for surveillance due to prior history of colon polyps  ? ?Risks, benefits, limitations, and alternatives regarding  colonoscopy have been reviewed with the patient.  Questions have been answered.  All parties agreeable. ? ? ?Jeffrey Bellows, MD  12/10/2021, 11:09 AM ? ?

## 2021-12-10 NOTE — Anesthesia Postprocedure Evaluation (Signed)
Anesthesia Post Note ? ?Patient: MARQUEE FUCHS ? ?Procedure(s) Performed: COLONOSCOPY WITH PROPOFOL ? ?Patient location during evaluation: PACU ?Anesthesia Type: General ?Level of consciousness: awake and awake and alert ?Pain management: satisfactory to patient ?Vital Signs Assessment: post-procedure vital signs reviewed and stable ?Respiratory status: spontaneous breathing and nonlabored ventilation ?Cardiovascular status: stable ?Anesthetic complications: no ? ? ?No notable events documented. ? ? ?Last Vitals:  ?Vitals:  ? 12/10/21 1145 12/10/21 1155  ?BP: 104/82 (!) 125/91  ?Pulse: (!) 53 (!) 51  ?Resp: 20 20  ?Temp: (!) 35.6 ?C   ?SpO2: 100% 100%  ?  ?Last Pain:  ?Vitals:  ? 12/10/21 1155  ?TempSrc:   ?PainSc: 0-No pain  ? ? ?  ?  ?  ?  ?  ?  ? ?VAN STAVEREN,Aylissa Heinemann ? ? ? ? ?

## 2021-12-11 ENCOUNTER — Encounter: Payer: Self-pay | Admitting: Gastroenterology

## 2021-12-11 LAB — SURGICAL PATHOLOGY

## 2021-12-11 NOTE — Telephone Encounter (Signed)
Called and spoke to pt to clarify further.  ? ?Pt confirms he is taking Amlodipine 10 mg daily as reflected on medication list.  ? ?Pt also reported a few other changes that I have updated on med list: ? ?Bisoprolol 5 mg daily (not on med list previously) ?Aspirin 81 mg - taking 3 tablets daily (at ov reported 1 tab daily) ?Atorvastatin 20 mg - taking 1/2 tablet (at ov reported 20 mg daily) ? ?Med list is now updated.  ?Notified pt I will make Dr. Saunders Revel aware.  ?

## 2021-12-11 NOTE — Progress Notes (Signed)
Polyp was an adenoma- prep was poor - recommend repeat within 6 months

## 2021-12-14 ENCOUNTER — Other Ambulatory Visit: Payer: Self-pay | Admitting: Internal Medicine

## 2021-12-14 ENCOUNTER — Ambulatory Visit: Payer: PPO | Admitting: Surgery

## 2021-12-14 ENCOUNTER — Other Ambulatory Visit: Payer: Self-pay | Admitting: Family Medicine

## 2022-01-25 ENCOUNTER — Ambulatory Visit (INDEPENDENT_AMBULATORY_CARE_PROVIDER_SITE_OTHER): Payer: PPO | Admitting: Family Medicine

## 2022-01-25 ENCOUNTER — Encounter: Payer: Self-pay | Admitting: Family Medicine

## 2022-01-25 VITALS — BP 132/85 | HR 69 | Temp 97.8°F | Ht 71.25 in | Wt 185.4 lb

## 2022-01-25 DIAGNOSIS — I77810 Thoracic aortic ectasia: Secondary | ICD-10-CM | POA: Diagnosis not present

## 2022-01-25 DIAGNOSIS — D696 Thrombocytopenia, unspecified: Secondary | ICD-10-CM

## 2022-01-25 DIAGNOSIS — Z125 Encounter for screening for malignant neoplasm of prostate: Secondary | ICD-10-CM

## 2022-01-25 DIAGNOSIS — I10 Essential (primary) hypertension: Secondary | ICD-10-CM | POA: Diagnosis not present

## 2022-01-25 DIAGNOSIS — I509 Heart failure, unspecified: Secondary | ICD-10-CM

## 2022-01-25 DIAGNOSIS — Z800 Family history of malignant neoplasm of digestive organs: Secondary | ICD-10-CM

## 2022-01-25 DIAGNOSIS — Z1211 Encounter for screening for malignant neoplasm of colon: Secondary | ICD-10-CM | POA: Diagnosis not present

## 2022-01-25 DIAGNOSIS — M109 Gout, unspecified: Secondary | ICD-10-CM | POA: Diagnosis not present

## 2022-01-25 DIAGNOSIS — Z0000 Encounter for general adult medical examination without abnormal findings: Secondary | ICD-10-CM

## 2022-01-25 DIAGNOSIS — E785 Hyperlipidemia, unspecified: Secondary | ICD-10-CM

## 2022-01-25 DIAGNOSIS — R7303 Prediabetes: Secondary | ICD-10-CM | POA: Diagnosis not present

## 2022-01-25 LAB — CBC WITH DIFFERENTIAL/PLATELET
Basophils Absolute: 0 10*3/uL (ref 0.0–0.1)
Basophils Relative: 0.6 % (ref 0.0–3.0)
Eosinophils Absolute: 0.3 10*3/uL (ref 0.0–0.7)
Eosinophils Relative: 5.6 % — ABNORMAL HIGH (ref 0.0–5.0)
HCT: 43.1 % (ref 39.0–52.0)
Hemoglobin: 14.8 g/dL (ref 13.0–17.0)
Lymphocytes Relative: 17 % (ref 12.0–46.0)
Lymphs Abs: 1.1 10*3/uL (ref 0.7–4.0)
MCHC: 34.4 g/dL (ref 30.0–36.0)
MCV: 94.1 fl (ref 78.0–100.0)
Monocytes Absolute: 0.5 10*3/uL (ref 0.1–1.0)
Monocytes Relative: 7.5 % (ref 3.0–12.0)
Neutro Abs: 4.3 10*3/uL (ref 1.4–7.7)
Neutrophils Relative %: 69.3 % (ref 43.0–77.0)
Platelets: 106 10*3/uL — ABNORMAL LOW (ref 150.0–400.0)
RBC: 4.58 Mil/uL (ref 4.22–5.81)
RDW: 13.9 % (ref 11.5–15.5)
WBC: 6.2 10*3/uL (ref 4.0–10.5)

## 2022-01-25 LAB — LIPID PANEL
Cholesterol: 117 mg/dL (ref 0–200)
HDL: 49 mg/dL (ref >39.00)
LDL Cholesterol: 32 mg/dL (ref 0–99)
NonHDL: 67.76
Total CHOL/HDL Ratio: 2
Triglycerides: 177 mg/dL — ABNORMAL HIGH (ref 0.0–149.0)
VLDL: 35.4 mg/dL (ref 0.0–40.0)

## 2022-01-25 LAB — COMPREHENSIVE METABOLIC PANEL
ALT: 31 U/L (ref 0–53)
AST: 26 U/L (ref 0–37)
Albumin: 4.3 g/dL (ref 3.5–5.2)
Alkaline Phosphatase: 51 U/L (ref 39–117)
BUN: 21 mg/dL (ref 6–23)
CO2: 30 mEq/L (ref 19–32)
Calcium: 8.8 mg/dL (ref 8.4–10.5)
Chloride: 101 mEq/L (ref 96–112)
Creatinine, Ser: 0.77 mg/dL (ref 0.40–1.50)
GFR: 85.6 mL/min (ref >60.00)
Glucose, Bld: 112 mg/dL — ABNORMAL HIGH (ref 70–99)
Potassium: 3.7 mEq/L (ref 3.5–5.1)
Sodium: 139 mEq/L (ref 135–145)
Total Bilirubin: 0.7 mg/dL (ref 0.2–1.2)
Total Protein: 6.4 g/dL (ref 6.0–8.3)

## 2022-01-25 LAB — TSH: TSH: 0.91 u[IU]/mL (ref 0.35–5.50)

## 2022-01-25 LAB — HEMOGLOBIN A1C: Hgb A1c MFr Bld: 6.3 % (ref 4.6–6.5)

## 2022-01-25 MED ORDER — BISOPROLOL FUMARATE 5 MG PO TABS: ORAL_TABLET | ORAL | 3 refills | Status: DC

## 2022-01-25 NOTE — Assessment & Plan Note (Signed)
CBC today  ?No new bleeding or bruising  ?

## 2022-01-25 NOTE — Assessment & Plan Note (Signed)
No clinical changes ?Under cardiol care ?Taking lisinopril 40 mg daily ?

## 2022-01-25 NOTE — Assessment & Plan Note (Signed)
Doing well with allopurinol 150 mg  ?Lab today ?

## 2022-01-25 NOTE — Assessment & Plan Note (Signed)
Lab Results  ?Component Value Date  ? HGBA1C 6.1 08/18/2020  ? ?disc imp of low glycemic diet and wt loss to prevent DM2  ?

## 2022-01-25 NOTE — Progress Notes (Signed)
? ?Subjective:  ? ? Patient ID: Jeffrey [name], male    DOB: February 07, 1941, 81 y.o.   MRN: [id number] ? ?HPI ?Pt presents for amw and health mt visit  ? ?I have personally reviewed the Medicare Annual Wellness questionnaire and have noted ?1. The patient's medical and social history ?2. Their use of alcohol, tobacco or illicit drugs ?3. Their current medications and supplements ?4. The patient's functional ability including ADL's, fall risks, home safety risks and hearing or visual ?            impairment. ?5. Diet and physical activities ?6. Evidence for depression or mood disorders ? ?The patients weight, height, BMI have been recorded in the chart and visual acuity is per eye clinic.  ?I have made referrals, counseling and provided education to the patient based review of the above and I have provided the pt with a written personalized care plan for preventive services. ?Reviewed and updated provider list, see scanned forms. ? ?See scanned forms.  Routine anticipatory guidance given to patient.  See health maintenance. ?Colon cancer screening  colonoscopy 12/2021 with 6 mo f/u for polyps with 2 d prep ?Sister had colon cancer  ? ? ?Flu vaccine: had one in the fall  ?Tetanus vaccine  07/2013 Td ?Pneumovax utd ?Zoster vaccine: needs the 2nd vaccine  ? ?Falls: none ?Fractures:none  ?Supplements: no D ?Exercise : walking / tennis   ? ?Prostate health ?Lab Results  ?Component Value Date  ? PSA 2.11 08/18/2020  ? PSA 1.82 04/23/2019  ? PSA 1.30 01/11/2018  ? ?No problems  ?Nocturia times one   ?No new family hx  ?  ?Advance directive: is up to date  ?Cognitive function addressed- see scanned forms- and if abnormal then additional documentation follows.  ? ?Handles own affairs  ?[employer]  ?Does finances and still works (he enjoys it for now)  ?Wants to retire some day  ?No confusion  ?Does not get lost  ? ? ?PMH and SH reviewed ? ?Meds, vitals, and allergies reviewed.  ? ?ROS: See HPI.  Otherwise negative.   ? ?Weight : ?Wt  Readings from Last 3 Encounters:  ?01/25/22 185 lb 6 oz (84.1 kg)  ?12/10/21 185 lb (83.9 kg)  ?12/09/21 185 lb (83.9 kg)  ? ?25.67 kg/m? ? ?Taking care of himself  ? ?Just went to [place] for 5 wk ?Good trip  ? ? ?Hearing/vision: ?Hearing Screening  ? '500Hz'$  '1000Hz'$  '2000Hz'$  '4000Hz'$   ?Right ear 40 40 40 0  ?Left ear 40 40 40 0  ? ?Vision Screening  ? Right eye Left eye Both eyes  ?Without correction     ?With correction '20/40 20/30 20/30 '$  ? ?No hearing problems that he knows of  ?Is due for eye exam  ?No changes in vision  ?May need a new px  ?Has never had cataract surgery  ? ?PHQ: ? ?  01/25/2022  ? 10:49 AM 08/21/2020  ?  8:22 AM 04/23/2019  ? 10:54 AM 01/23/2018  ?  8:49 AM 01/18/2017  ?  2:36 PM  ?Depression screen PHQ 2/9  ?Decreased Interest 0 0 0 0 0  ?Down, Depressed, Hopeless 0 0 0 0 0  ?PHQ - 2 Score 0 0 0 0 0  ?Altered sleeping   0 0   ?Tired, decreased energy   0 0   ?Change in appetite   0 0   ?Feeling bad or failure about yourself    0 0   ?Trouble concentrating  0 0   ?Moving slowly or fidgety/restless   0 0   ?Suicidal thoughts   0 0   ?PHQ-9 Score   0 0   ?Difficult doing work/chores    Not difficult at all   ? ? ? ?ADLs:  ?No help needed  ? ?Functionality: ?Excellent  ? ?Care team: ?Shray Hunley-pcp ?ENd- cardiology ? ? ?HTN in setting of CHF ? ?bp is stable today  ?No cp or palpitations or headaches or edema  ?No side effects to medicines  ?BP Readings from Last 3 Encounters:  ?01/25/22 (!) 142/90  ?12/10/21 (!) 125/91  ?12/09/21 130/74  ?  Had a stressful morning , back at work  ? ?Bisoprolol 7.5 mg  1 1/2 tab daily  ?Lisinopril 40 mg daily  ?Amlodipine 10 mg daily  ?Hctz 25 mg daily  ? ?H/o gout  ?Allopurinol 150 mg daily  ?Lab Results  ?Component Value Date  ? LABURIC 5.9 08/18/2020  ? ? ? ?Hyperlipidemia  ?Lab Results  ?Component Value Date  ? CHOL 118 12/09/2021  ? HDL 39 (L) 12/09/2021  ? [place] 41 12/09/2021  ? LDLDIRECT 49.0 08/18/2020  ? TRIG 191 (H) 12/09/2021  ? CHOLHDL 3.0 12/09/2021   ? ?Atorvastatin 10 mg daily  ? ? ?Prediabetes ?Lab Results  ?Component Value Date  ? HGBA1C 6.1 08/18/2020  ? ?Due for labs  ?When not on vacation, he avoids sugar  ?138 not fasting  ? ? ?Patient Active Problem List  ? Diagnosis Date Noted  ? History of stroke 12/05/2020  ? Heart failure with preserved ejection fraction ([place]) 03/01/2020  ? CHF (congestive heart failure) ([place]) 02/22/2020  ? Medicare annual wellness visit, subsequent 04/23/2019  ? Paresthesia of hand 02/12/2019  ? 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 Prostate cancer screening 11/05/2014  ? Family history of colon cancer 08/01/2013  ? Colon cancer screening 08/01/2013  ? Routine general medical examination at a health care facility 08/01/2013  ? PSA, INCREASED 09/07/2010  ? HERNIATED DISC 11/24/2009  ? Thrombocytopenia ([place]) 01/16/2009  ? Hyperlipidemia LDL goal <70 06/08/2007  ? Gout 06/08/2007  ? Primary hypertension 06/08/2007  ? Prediabetes 06/08/2007  ? SKIN CANCER, HX OF 06/08/2007  ? ?Past Medical History:  ?Diagnosis Date  ? DDD (degenerative disc disease)   ? with epidural injection  ? Gastritis   ? H pylori, partial tx.  EGD 04/2002  ? Gout   ? History of skin cancer   ? Hyperlipidemia   ? Hypertension   ? some white coat component- better at home  ? Stroke [facility])   ? TIA (transient ischemic attack)   ? ?Past Surgical History:  ?Procedure Laterality Date  ? COLONOSCOPY WITH PROPOFOL N/A 01/16/2018  ? Procedure: COLONOSCOPY WITH PROPOFOL;  Surgeon: Lollie Sails, MD;  [facility] ENDOSCOPY;  Service: Endoscopy;  Laterality: N/A;  ? COLONOSCOPY WITH PROPOFOL N/A 12/10/2021  ? Procedure: COLONOSCOPY WITH PROPOFOL;  Surgeon: Jonathon Bellows, MD;  Location: [facility] ENDOSCOPY;  Service: Gastroenterology;  Laterality: N/A;  ? [place]  ? LOOP RECORDER INSERTION N/A 12/21/2016  ? Procedure: Loop Recorder Insertion;  Surgeon: Deboraha Sprang, MD;  Location: [place] CV LAB;  Service:  Cardiovascular;  Laterality: N/A;  ? TEE WITHOUT CARDIOVERSION N/A 12/21/2016  ? Procedure: TRANSESOPHAGEAL ECHOCARDIOGRAM (TEE);  Surgeon: Nelva Bush, MD;  Location: [facility] ORS;  Service: Cardiovascular;  Laterality: N/A;  ? ?Social History  ? ?Tobacco Use  ? Smoking status:  Former  ?  Packs/day: 1.00  ?  Years: 8.00  ?  Pack years: 8.00  ?  Types: Cigarettes  ?  Quit date: 64  ?  Years since quitting: 50.3  ? Smokeless tobacco: Never  ?Vaping Use  ? Vaping Use: Never used  ?Substance Use Topics  ? Alcohol use: Yes  ?  Alcohol/week: 7.0 standard drinks  ?  Types: 7 Glasses of wine per week  ?  Comment: weekly-with dinner; 12/09/2021 "sometimes"  ? Drug use: No  ? ?Family History  ?Problem Relation Age of Onset  ? Other Mother   ?     brain tumor  ? Skin cancer Mother   ?     suspect melanoma  ? Heart attack Father 37  ? Colon cancer Sister   ? ?No Known Allergies ?Current Outpatient Medications on File Prior to Visit  ?Medication Sig Dispense Refill  ? allopurinol (ZYLOPRIM) 300 MG tablet Take 150 mg by mouth daily.    ? amLODipine (NORVASC) 10 MG tablet Take 1 tablet (10 mg total) by mouth daily. 30 tablet 1  ? Ascorbic Acid (VITAMIN C) 1000 MG tablet Take 1,000 mg by mouth daily.    ? aspirin EC 81 MG tablet Take 243 mg by mouth daily. Swallow whole.    ? atorvastatin (LIPITOR) 20 MG tablet Take 10 mg by mouth daily.    ? calcium carbonate (TUMS EX) 750 MG chewable tablet Chew 1 tablet by mouth every 2 (two) hours as needed for heartburn.    ? Calcium-Magnesium-Zinc 1000-400-15 MG TABS Take 1 tablet by mouth daily.    ? hydrochlorothiazide (HYDRODIURIL) 25 MG tablet TAKE 1 TABLET(25 MG) BY MOUTH DAILY 90 tablet 1  ? lisinopril (ZESTRIL) 40 MG tablet TAKE 1 TABLET(40 MG) BY MOUTH DAILY 90 tablet 1  ? Omega-3 Fatty Acids (FISH OIL PO) Take 1 capsule by mouth daily.    ? VITAMIN E PO Take 1 capsule by mouth daily.    ? ?No current facility-administered medications on file prior to visit.  ?  ?Review of Systems   ?Constitutional:  Negative for activity change, appetite change, fatigue, fever and unexpected weight change.  ?HENT:  Negative for congestion, rhinorrhea, sore throat and trouble swallowing.   ?Eyes:  Negative for pain, re

## 2022-01-25 NOTE — Assessment & Plan Note (Signed)
No clinical changes ?Under cardiology care  ?bp is controlled  ?

## 2022-01-25 NOTE — Assessment & Plan Note (Signed)
Recent colonoscopy  ?Recall 6 mo with 2 d prep for polyps ?

## 2022-01-25 NOTE — Assessment & Plan Note (Signed)
No longer checking psa in light of age  ?No clinical changes  ?

## 2022-01-25 NOTE — Assessment & Plan Note (Signed)
bp in fair control at this time  ?BP Readings from Last 1 Encounters:  ?01/25/22 132/85  ? ?No changes needed ?Most recent labs reviewed  ?Disc lifstyle change with low sodium diet and exercise  ?Plan to continue ?Bisoprolol 7.5 mg  1 1/2 tab daily  ?Lisinopril 40 mg daily  ?Amlodipine 10 mg daily  ?Hctz 25 mg daily  ?

## 2022-01-25 NOTE — Patient Instructions (Addendum)
If you are interested in the new shingles vaccine (Shingrix) - call your local pharmacy to check on coverage and availability  ?If affordable, get on a wait list at your pharmacy to get the vaccine. ? ?(Get the 2nd one you are due for)  ? ?Start taking 2000 iu of vitamin D3 over the counter daily  ?This is for bone health ? ?Get in for your yearly eye exam  ? ?Lab today  ? ?Think about using sunscreen when outside  ? ?Keep exercising  ? ? ?

## 2022-01-25 NOTE — Assessment & Plan Note (Signed)
Reviewed health habits including diet and exercise and skin cancer prevention ?Reviewed appropriate screening tests for age  ?Also reviewed health mt list, fam hx and immunization status , as well as social and family history   ?HPI reviewed ?Labs ordered ?Had a flu shot in the fall ?Needs second Shingrix vaccine, he is planning that at the pharmacy ?No falls or fractures ?Encouraged vitamin D intake for bone health and continued exercise ?No new urinary symptoms or prostate changes ?Advanced directive is up-to-date ?No cognitive concerns ?Reviewed hearing and vision screens, he plans to follow-up for his yearly eye exam and denies hearing problems ?PHQ score is 0 ?No help needed with ADLs, excellent functionality ?

## 2022-01-25 NOTE — Assessment & Plan Note (Signed)
Disc goals for lipids and reasons to control them ?Rev last labs with pt ?Rev low sat fat diet in detail ?Well controlled  ?Last LDL of 41 ?On atorvastatin 10 daily sees cardiology  ?

## 2022-01-25 NOTE — Assessment & Plan Note (Signed)
Recent colonoscopy rev ?Has next planned in 6 mo ?

## 2022-01-25 NOTE — Assessment & Plan Note (Signed)
Reviewed health habits including diet and exercise and skin cancer prevention ?Reviewed appropriate screening tests for age  ?Also reviewed health mt list, fam hx and immunization status , as well as social and family history   ?HPI reviewed ?Labs ordered ?Had a flu shot in the fall ?Needs second Shingrix vaccine, he is planning that at the pharmacy ?No falls or fractures ?Encouraged vitamin D intake for bone health and continued exercise ?No new urinary symptoms or prostate changes ?Advanced directive is up-to-date ?No cognitive concerns ?Reviewed hearing and vision screens, he plans to follow-up for his yearly eye exam and denies hearing problems ?PHQ score is 0 ?No help needed with ADLs, excellent functionality ? ?

## 2022-01-27 IMAGING — US US ABDOMEN LIMITED
1 series · 14 of 25 positions shown · non-contrast
Comparison: None.

CLINICAL DATA: Right upper quadrant pain.

EXAM:
ULTRASOUND ABDOMEN LIMITED RIGHT UPPER QUADRANT

[Series 1: us abdomen limited ruq (liver/gb) · 14 of 54 slices shown]
[im 1/54]
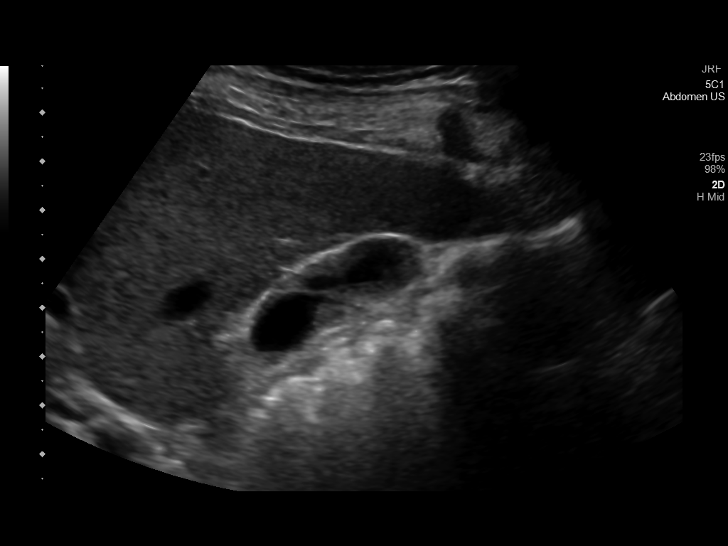
[im 5/54]
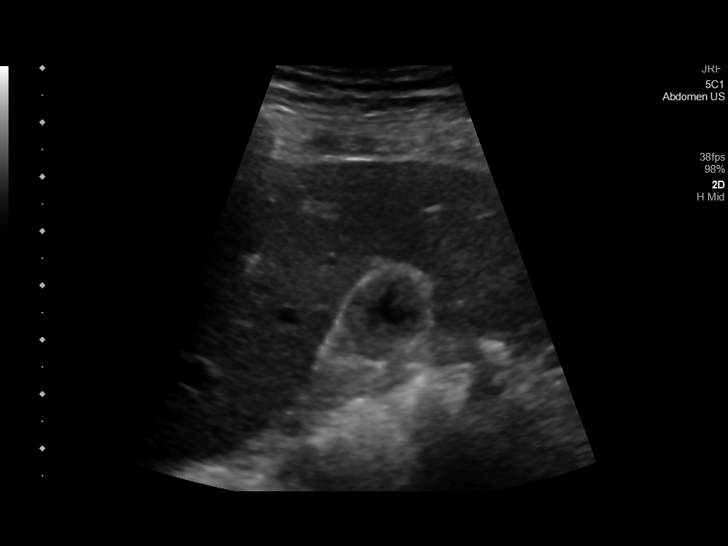
[im 9/54]
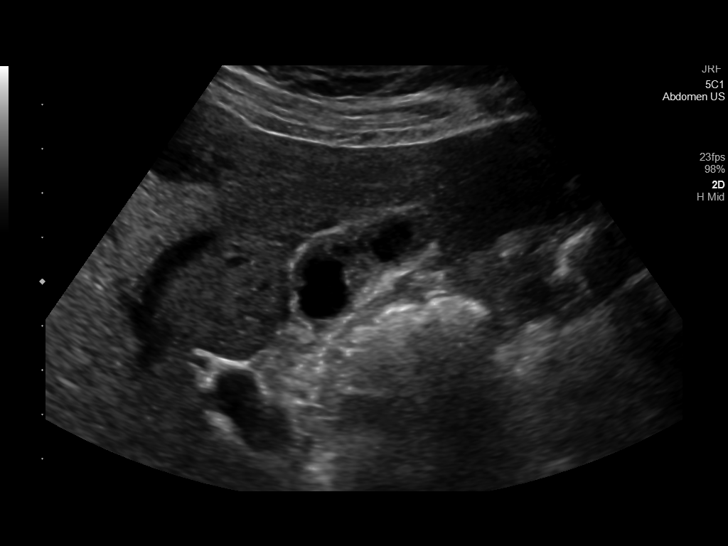
[im 14/54]
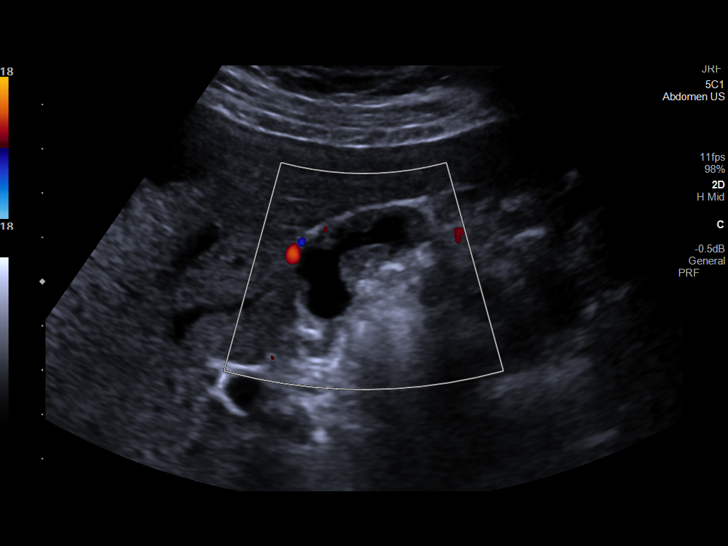
[im 18/54]
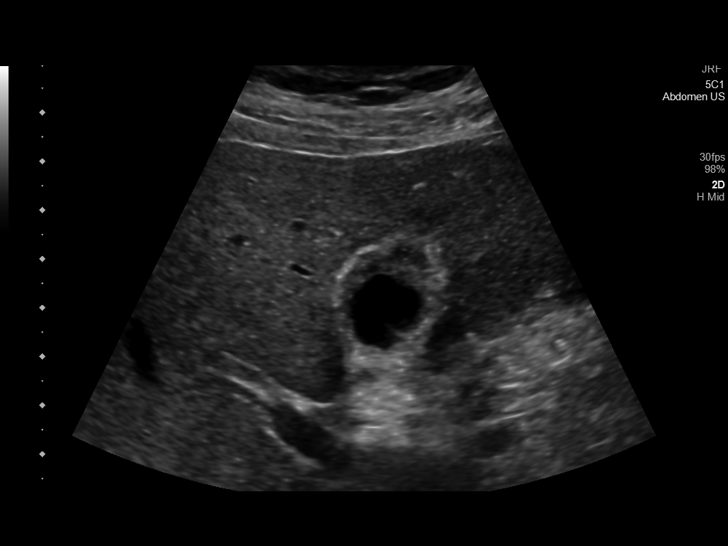
[im 20/54]
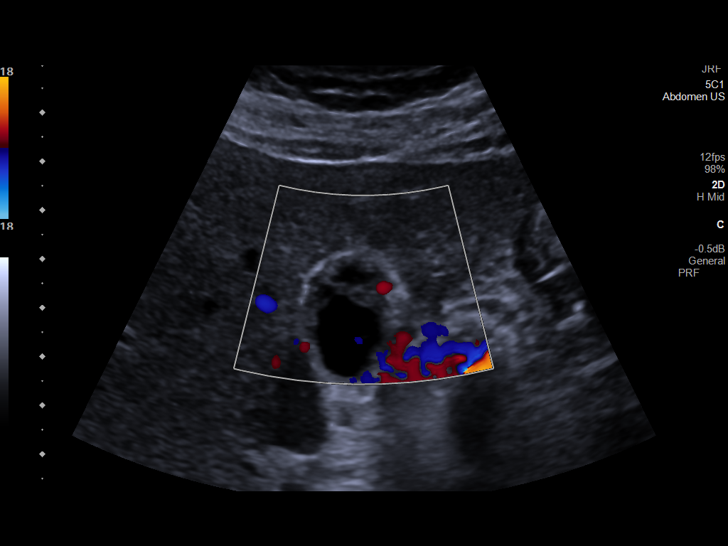
[im 25/54]
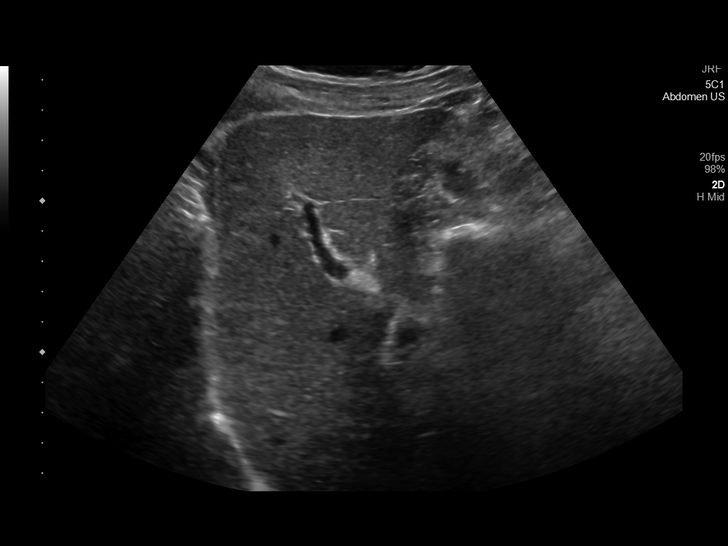
[im 29/54]
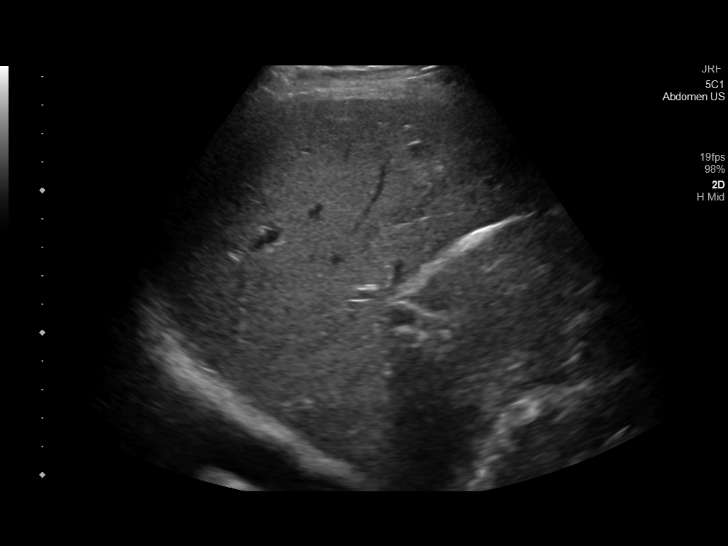
[im 34/54]
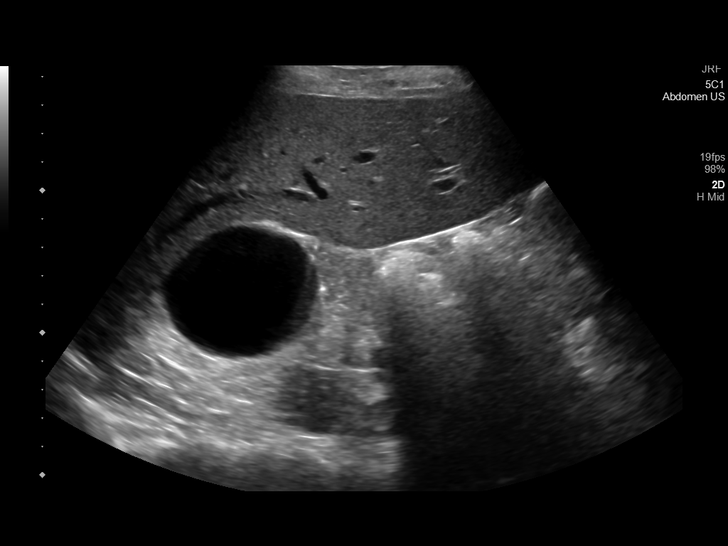
[im 36/54]
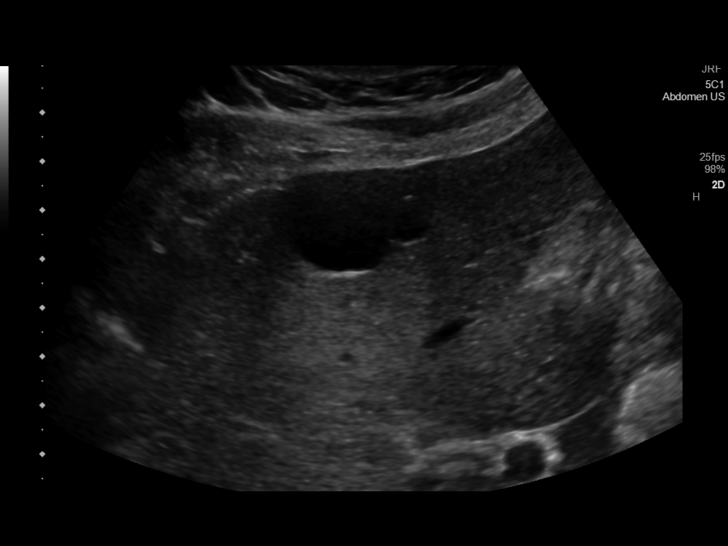
[im 40/54]
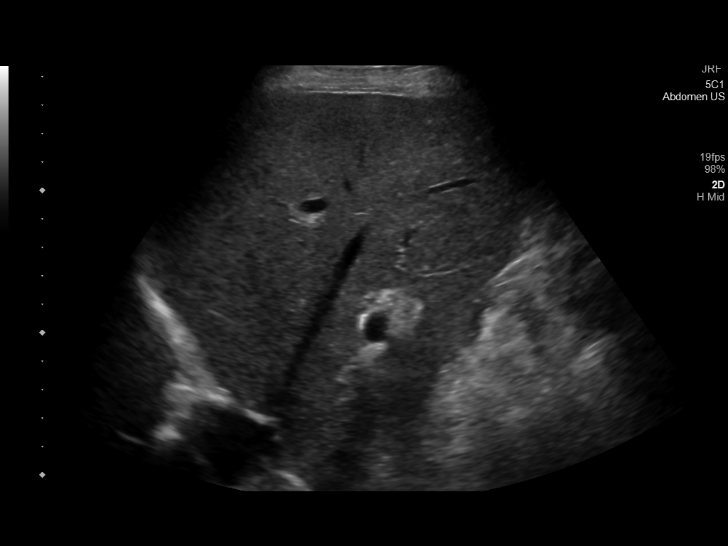
[im 45/54]
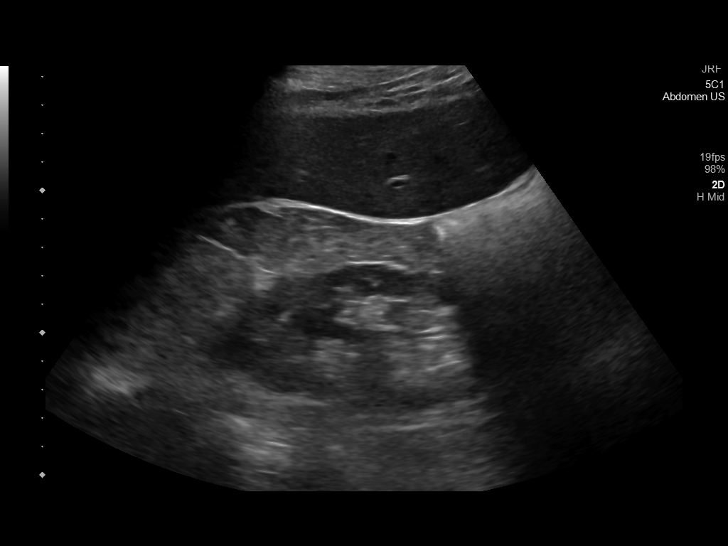
[im 49/54]
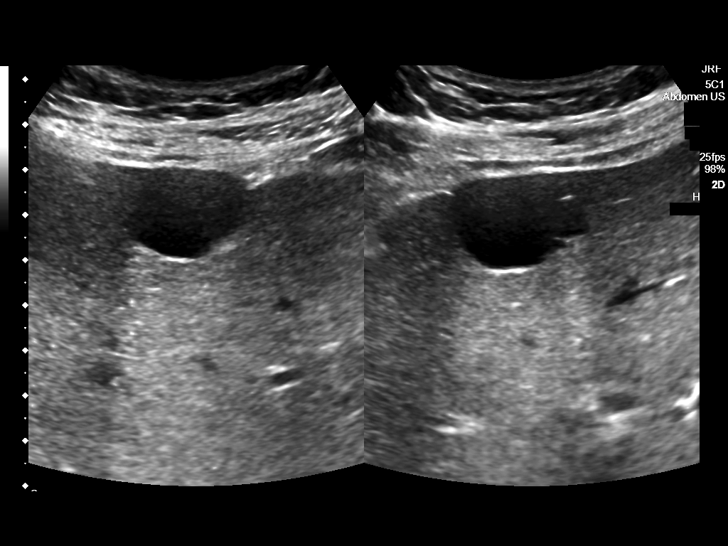
[im 54/54]
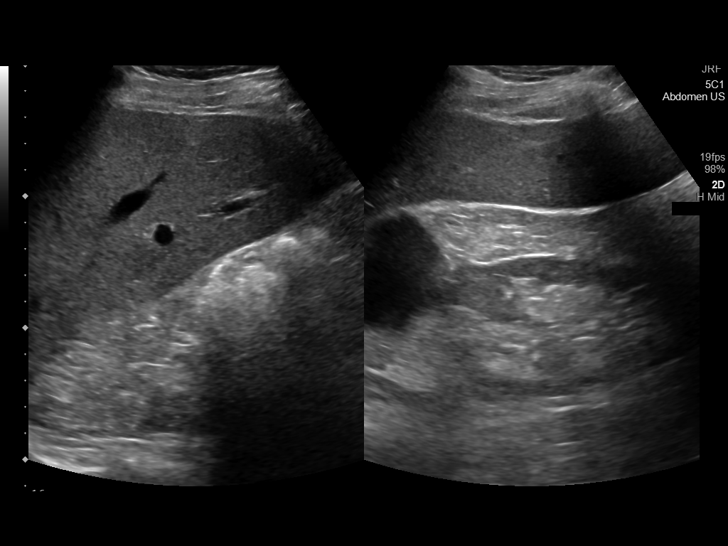

[14 of 25 positions shown; findings below may reference images not displayed]

FINDINGS: Gallbladder:

No gallstones are visualized. The gallbladder is contracted. The
gallbladder wall measures 5.7 mm in thickness. No sonographic Murphy
sign noted by sonographer.

Common bile duct:

Diameter: 2.3 mm

Liver:

A 3.1 cm x 2.2 cm x 2.7 cm anechoic structure is seen within the
right lobe of the liver. No abnormal flow is seen within this region
on color Doppler evaluation. Within normal limits in parenchymal
echogenicity. Portal vein is patent on color Doppler imaging with
normal direction of blood flow towards the liver.

Other: A 5.7 cm x 5.2 cm x 6.1 cm anechoic structure is seen within
the right kidney. No abnormal flow is seen within this region on
color Doppler evaluation.
IMPRESSION: 1. Gallbladder wall thickening without evidence of cholelithiasis or
acute cholecystitis.
2. Large simple hepatic and simple right renal cysts.

## 2022-01-27 IMAGING — CR DG ABDOMEN 1V
1 series · 2 of 2 positions shown · non-contrast
Comparison: 03/10/2016

CLINICAL DATA: Right upper quadrant abdominal pain. Elevated liver
function tests. Mid abdominal pain.

EXAM:
ABDOMEN - 1 VIEW

[Series 1: dg abd 1 view · 0.14mm/px · 2 of 2 slices shown]
[im 1/2]
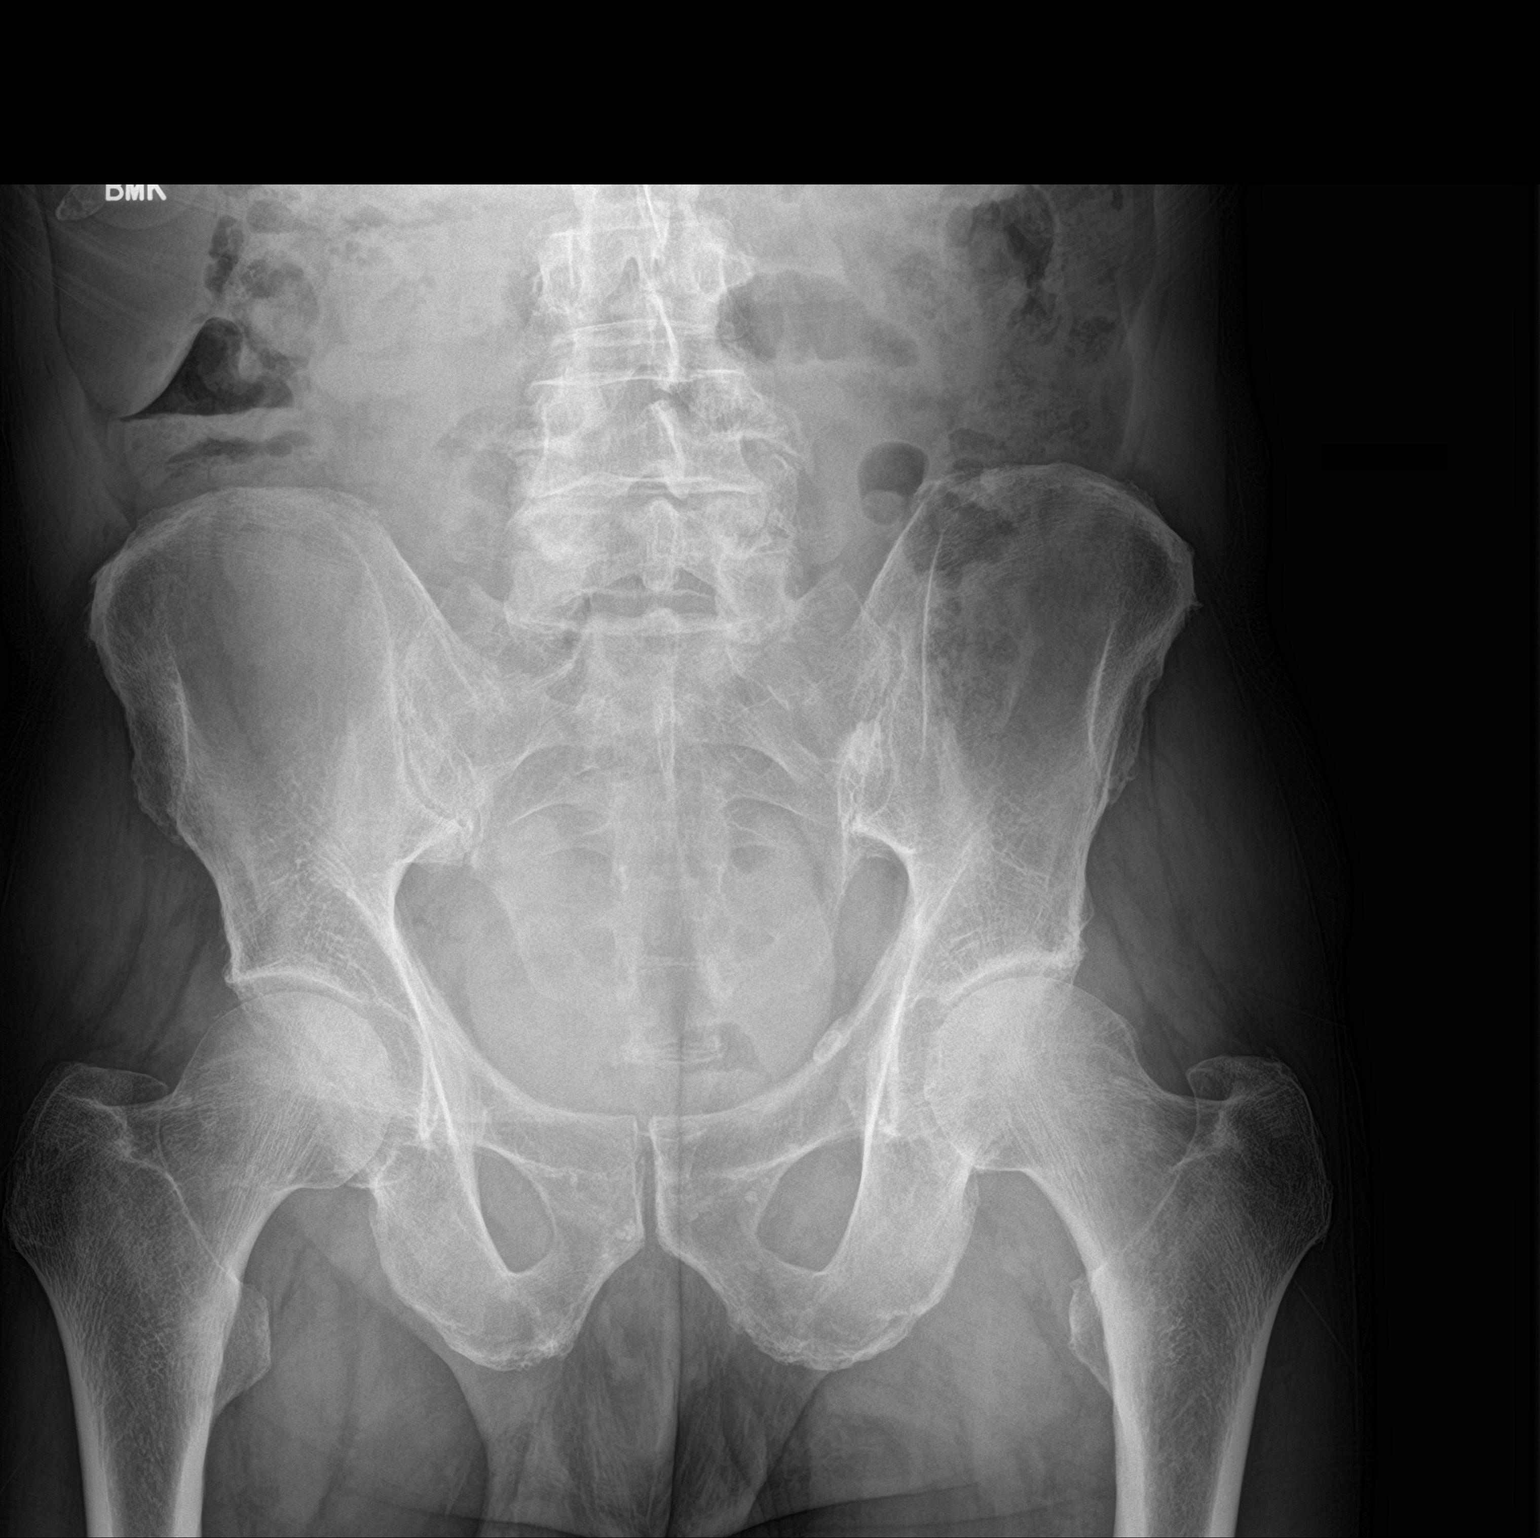
[im 2/2]
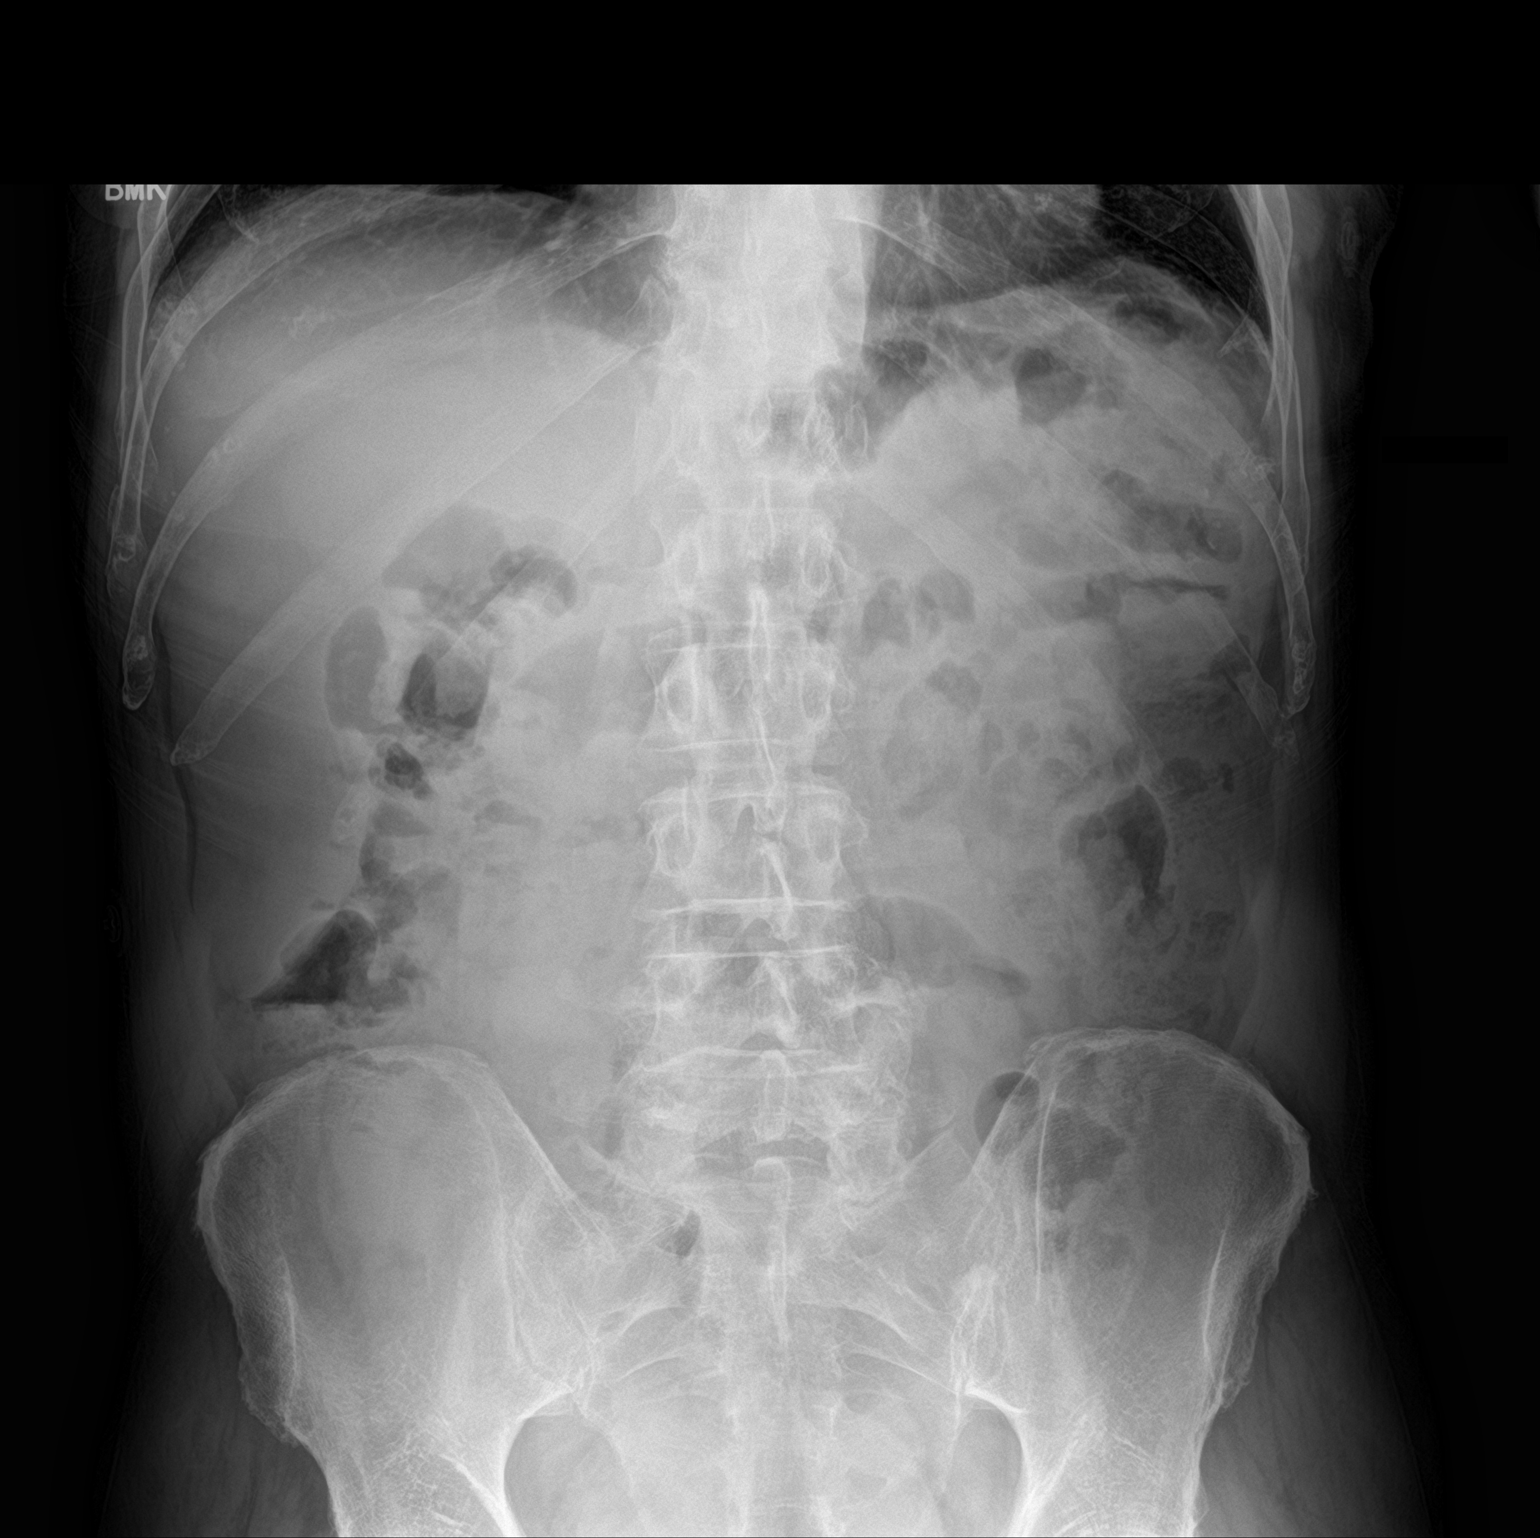

[2 of 2 positions shown; findings below may reference images not displayed]

FINDINGS: Bowel gas pattern unremarkable.  The lung bases appear clear.

Axial loss of articular space in the hips, left greater than right,
likely degenerative.

No findings of organomegaly.
IMPRESSION: 1. Unremarkable bowel gas pattern.
2. Degenerative chondral thinning in the hips.

## 2022-02-11 ENCOUNTER — Other Ambulatory Visit (INDEPENDENT_AMBULATORY_CARE_PROVIDER_SITE_OTHER): Payer: PPO

## 2022-02-11 DIAGNOSIS — D696 Thrombocytopenia, unspecified: Secondary | ICD-10-CM

## 2022-02-11 LAB — CBC WITH DIFFERENTIAL/PLATELET
Basophils Absolute: 0 10*3/uL (ref 0.0–0.1)
Basophils Relative: 0.7 % (ref 0.0–3.0)
Eosinophils Absolute: 0.5 10*3/uL (ref 0.0–0.7)
Eosinophils Relative: 8.6 % — ABNORMAL HIGH (ref 0.0–5.0)
HCT: 44.8 % (ref 39.0–52.0)
Hemoglobin: 15.2 g/dL (ref 13.0–17.0)
Lymphocytes Relative: 24 % (ref 12.0–46.0)
Lymphs Abs: 1.3 10*3/uL (ref 0.7–4.0)
MCHC: 33.9 g/dL (ref 30.0–36.0)
MCV: 94.8 fl (ref 78.0–100.0)
Monocytes Absolute: 0.5 10*3/uL (ref 0.1–1.0)
Monocytes Relative: 8.6 % (ref 3.0–12.0)
Neutro Abs: 3.1 10*3/uL (ref 1.4–7.7)
Neutrophils Relative %: 58.1 % (ref 43.0–77.0)
Platelets: 115 10*3/uL — ABNORMAL LOW (ref 150.0–400.0)
RBC: 4.73 Mil/uL (ref 4.22–5.81)
RDW: 13.5 % (ref 11.5–15.5)
WBC: 5.3 10*3/uL (ref 4.0–10.5)

## 2022-02-15 ENCOUNTER — Telehealth: Payer: Self-pay

## 2022-02-15 NOTE — Telephone Encounter (Signed)
-----   Message from Abner Greenspan, MD sent at 02/12/2022  1:50 PM EDT ----- ?Your platelets are back up this time/ reassuring  ?Re check in 3 months-please call and schedule a lab visit  ?If you develop new bleeding or bruising please let us know  ?

## 2022-02-15 NOTE — Telephone Encounter (Signed)
Called and lvm for patient to call us back regarding his lab results. ?

## 2022-02-16 NOTE — Telephone Encounter (Signed)
Pt called back and scheduled 3 month follow up lab, he said he didn't have any questions for the nurse but said he will call back if any If he develops any new bleeding or bruising  ?

## 2022-02-21 ENCOUNTER — Other Ambulatory Visit: Payer: Self-pay | Admitting: Family Medicine

## 2022-02-22 NOTE — Telephone Encounter (Signed)
Is this okay to refill   he hasn't had this by you before ?

## 2022-03-15 ENCOUNTER — Other Ambulatory Visit: Payer: Self-pay | Admitting: Family Medicine

## 2022-03-16 NOTE — Telephone Encounter (Signed)
Is this okay to refill you last refill this for  him in 2013,  he got a refill in 02/23 an 3 month supply ,pt said that this come from our office

## 2022-04-25 DIAGNOSIS — N390 Urinary tract infection, site not specified: Secondary | ICD-10-CM | POA: Diagnosis not present

## 2022-05-17 ENCOUNTER — Telehealth: Payer: Self-pay | Admitting: Family Medicine

## 2022-05-17 DIAGNOSIS — R7303 Prediabetes: Secondary | ICD-10-CM

## 2022-05-17 DIAGNOSIS — D696 Thrombocytopenia, unspecified: Secondary | ICD-10-CM

## 2022-05-17 NOTE — Telephone Encounter (Signed)
-----   Message from Ellamae Sia sent at 05/04/2022  3:25 PM EDT ----- Regarding: lab orders for Wednesday, 8.9.23 Lab orders for a 3 month follow up appt.

## 2022-05-19 ENCOUNTER — Other Ambulatory Visit (INDEPENDENT_AMBULATORY_CARE_PROVIDER_SITE_OTHER): Payer: PPO

## 2022-05-19 DIAGNOSIS — R7303 Prediabetes: Secondary | ICD-10-CM

## 2022-05-19 DIAGNOSIS — D696 Thrombocytopenia, unspecified: Secondary | ICD-10-CM | POA: Diagnosis not present

## 2022-05-19 LAB — CBC WITH DIFFERENTIAL/PLATELET
Basophils Absolute: 0 10*3/uL (ref 0.0–0.1)
Basophils Relative: 0.7 % (ref 0.0–3.0)
Eosinophils Absolute: 0.5 10*3/uL (ref 0.0–0.7)
Eosinophils Relative: 9.3 % — ABNORMAL HIGH (ref 0.0–5.0)
HCT: 41.4 % (ref 39.0–52.0)
Hemoglobin: 14.2 g/dL (ref 13.0–17.0)
Lymphocytes Relative: 20.1 % (ref 12.0–46.0)
Lymphs Abs: 1 10*3/uL (ref 0.7–4.0)
MCHC: 34.4 g/dL (ref 30.0–36.0)
MCV: 94.7 fl (ref 78.0–100.0)
Monocytes Absolute: 0.5 10*3/uL (ref 0.1–1.0)
Monocytes Relative: 9.2 % (ref 3.0–12.0)
Neutro Abs: 3.1 10*3/uL (ref 1.4–7.7)
Neutrophils Relative %: 60.7 % (ref 43.0–77.0)
Platelets: 102 10*3/uL — ABNORMAL LOW (ref 150.0–400.0)
RBC: 4.37 Mil/uL (ref 4.22–5.81)
RDW: 13.5 % (ref 11.5–15.5)
WBC: 5 10*3/uL (ref 4.0–10.5)

## 2022-05-19 LAB — HEMOGLOBIN A1C: Hgb A1c MFr Bld: 6.4 % (ref 4.6–6.5)

## 2022-05-26 ENCOUNTER — Other Ambulatory Visit: Payer: Self-pay

## 2022-05-26 ENCOUNTER — Telehealth: Payer: Self-pay

## 2022-05-26 DIAGNOSIS — Z86010 Personal history of colonic polyps: Secondary | ICD-10-CM

## 2022-05-26 MED ORDER — PEG 3350-KCL-NA BICARB-NACL 420 G PO SOLR: ORAL | 0 refills | Status: DC

## 2022-05-26 NOTE — Telephone Encounter (Signed)
Patient contacted office to schedule his repeat colonoscopy with Dr. Vicente Males.  Previous colonoscopy noted suboptimal prep, therefore colonoscopy was scheduled with a 2 day prep using Nulytely Preps.

## 2022-06-10 ENCOUNTER — Ambulatory Visit: Payer: PPO | Attending: Internal Medicine | Admitting: Internal Medicine

## 2022-06-10 ENCOUNTER — Encounter: Payer: Self-pay | Admitting: Internal Medicine

## 2022-06-10 VITALS — BP 126/78 | HR 53 | Ht 73.0 in | Wt 184.0 lb

## 2022-06-10 DIAGNOSIS — I10 Essential (primary) hypertension: Secondary | ICD-10-CM

## 2022-06-10 DIAGNOSIS — E785 Hyperlipidemia, unspecified: Secondary | ICD-10-CM | POA: Diagnosis not present

## 2022-06-10 DIAGNOSIS — Z8673 Personal history of transient ischemic attack (TIA), and cerebral infarction without residual deficits: Secondary | ICD-10-CM | POA: Diagnosis not present

## 2022-06-10 DIAGNOSIS — I5032 Chronic diastolic (congestive) heart failure: Secondary | ICD-10-CM | POA: Diagnosis not present

## 2022-06-10 NOTE — Progress Notes (Signed)
.  Follow-up Outpatient Visit Date: 06/10/2022  Primary Care Provider: Abner Greenspan, MD [place] [address]  Chief Complaint: Follow-up HFpEF and hypertension  HPI:  [name] is a 81 y.o. male with history of stroke, HFpEF, hypertension, and hyperlipidemia, who presents for follow-up of stroke, hypertension and HFpEF.  I last saw him in March, at which time he was feeling well without chest pain, shortness of breath, edema, or new focal neurologic symptoms.  We did not make any medication changes.  Today, [name] is doing well.  He denies chest pain, shortness of breath, palpitations, lightheadedness, edema, and neurologic changes.  His BP at home has been well-controlled with readings over the last 3 weeks of 107-132-49-74.  Hear rates are generally in the 60's.  He is scheduled for repeat colonoscopy next month.  --------------------------------------------------------------------------------------------------  Cardiovascular History & Procedures: Cardiovascular Problems: Cryptogenic stroke Mildly dilated ascending aorta HFpEF   Risk Factors: Hypertension, hyperlipidemia, male gender, and age greater than 53   Cath/PCI: None   CV Surgery: None   EP Procedures and Devices: Implantable loop recorder (12/21/16) 24-hour Holter monitor (10/20/16, [place]): Predominant rhythm was sinus with an average rate of 63 bpm (range 46-93 bpm). Rare supraventricular and ventricular ectopy was identified. There was one episode of nonsustained ventricular tachycardia lasting 4 beats. No sustained arrhythmias, including atrial fibrillation/flutter) are evident.   Non-Invasive Evaluation(s): Exercise tolerance test (03/27/2020): Low risk study without evidence of ischemia.  Rare isolated PVCs noted before and during stress. TTE (02/05/2020, [facility]): Normal LV size with mild LVH.  LVEF 55-60% with grade 2 diastolic dysfunction and elevated  filling pressures.  Normal RV size and function.  Moderate left and mild right atrial enlargement.  Mild to moderate mitral regurgitation.  Mild tricuspid regurgitation.  Aortic sclerosis.  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  TEE (12/21/16): Normal LV size with mild LVH. LVEF 55-65%. Mild aortic regurgitation. Mildly dilated ascending aorta. Mild mitral regurgitation. No left or right atrial thrombus. No PFO or ASD; negative TTE (12/02/16): Normal LV size with mild LVH. LVEF 55-60% with normal wall motion. Normal diastolic function. Moderate AI. Mildly dilated ascending aorta measuring 3.9 cm. Mild MR. Normal RV size and function. Transthoracic echocardiogram (10/2016; [place]): Reportedly normal, though no images or reports is available for review.  Recent CV Pertinent Labs: Lab Results  Component Value Date   CHOL 117 01/25/2022   HDL 49.00 01/25/2022   LDLCALC 32 01/25/2022   LDLDIRECT 49.0 08/18/2020   TRIG 177.0 (H) 01/25/2022   CHOLHDL 2 01/25/2022   INR 1.1 12/20/2018   K 3.7 01/25/2022   K 3.9 05/15/2009   BUN 21 01/25/2022   BUN 12 05/15/2009   CREATININE 0.77 01/25/2022   CREATININE 0.8 05/15/2009    Past medical and surgical history were reviewed and updated in EPIC.  Current Meds  Medication Sig   allopurinol (ZYLOPRIM) 300 MG tablet TAKE 1 TABLET(300 MG) BY MOUTH DAILY AT NIGHT   Ascorbic Acid (VITAMIN C) 1000 MG tablet Take 1,000 mg by mouth daily.   aspirin EC 81 MG tablet Take 243 mg by mouth daily. Swallow whole.   atorvastatin (LIPITOR) 20 MG tablet Take 10 mg by mouth daily.   bisoprolol (ZEBETA) 5 MG tablet TAKE 1 AND 1/2 TABLETS(7.5 MG) BY MOUTH EVERY DAY   hydrochlorothiazide (HYDRODIURIL) 25 MG tablet TAKE 1 TABLET(25 MG) BY MOUTH DAILY   lisinopril (ZESTRIL) 40 MG tablet TAKE 1  TABLET(40 MG) BY MOUTH DAILY   Omega-3 Fatty Acids (FISH OIL PO) Take 1 capsule by mouth daily.   polyethylene glycol-electrolytes (NULYTELY) 420 g solution Use as  directed per colonoscopy instructions sent to patient.   VITAMIN E PO Take 1 capsule by mouth daily.    Allergies: Patient has no known allergies.  Social History   Tobacco Use   Smoking status: Former    Packs/day: 1.00    Years: 8.00    Total pack years: 8.00    Types: Cigarettes    Quit date: 1973    Years since quitting: 50.6   Smokeless tobacco: Never  Vaping Use   Vaping Use: Never used  Substance Use Topics   Alcohol use: Yes    Alcohol/week: 7.0 standard drinks of alcohol    Types: 7 Glasses of wine per week    Comment: weekly-with dinner; 12/09/2021 "sometimes"   Drug use: No    Family History  Problem Relation Age of Onset   Other Mother        brain tumor   Skin cancer Mother        suspect melanoma   Heart attack Father 67   Colon cancer Sister     Review of Systems: A 12-system review of systems was performed and was negative except as noted in the HPI.  --------------------------------------------------------------------------------------------------  Physical Exam: BP 126/78 (BP Location: Left Arm, Patient Position: Sitting, Cuff Size: Normal)   Pulse (!) 53   Ht '6\' 1"'$  (1.854 m)   Wt 184 lb (83.5 kg)   SpO2 97%   BMI 24.28 kg/m   General:  NAD. Neck: No JVD or HJR. Lungs: Clear to auscultation bilaterally without wheezes or crackles. Heart: Bradycardic but regular without murmurs, rubs, or gallops. Abdomen: Soft, nontender, nondistended. Extremities: No lower extremity edema.  EKG:  Sinus bradycardia.  Otherwise, no significant abnormality.  Lab Results  Component Value Date   WBC 5.0 05/19/2022   HGB 14.2 05/19/2022   HCT 41.4 05/19/2022   MCV 94.7 05/19/2022   PLT 102.0 (L) 05/19/2022    Lab Results  Component Value Date   NA 139 01/25/2022   K 3.7 01/25/2022   CL 101 01/25/2022   CO2 30 01/25/2022   BUN 21 01/25/2022   CREATININE 0.77 01/25/2022   GLUCOSE 112 (H) 01/25/2022   ALT 31 01/25/2022    Lab Results  Component  Value Date   CHOL 117 01/25/2022   HDL 49.00 01/25/2022   LDLCALC 32 01/25/2022   LDLDIRECT 49.0 08/18/2020   TRIG 177.0 (H) 01/25/2022   CHOLHDL 2 01/25/2022    --------------------------------------------------------------------------------------------------  ASSESSMENT AND PLAN: Chronic HFpEF: [name] appears euvolemic without heart failure symptoms (NYHA class I).  Continue current regimen of bisoprolol, lisinopril, and HCTZ.  History of stroke: No new sx.  Continue secondary prevention, including atorvastatin and aspirin.  Hypertension: Blood pressure well-controlled.  Continue current medications.  Hyperlipidemia: LDL very well-controlled on last check in April.  TG's slightly elevated at that time.  Continue atorvastatin 20 mg daily and lifestyle modifications.  Follow-up: Return to clinic in 1 year.  Nelva Bush, MD 06/10/2022 9:32 AM

## 2022-06-10 NOTE — Patient Instructions (Signed)
Medication Instructions:  Continue your current medications  *If you need a refill on your cardiac medications before your next appointment, please call your pharmacy*   Lab Work: None  Testing/Procedures: None   Follow-Up: At [facility], you and your health needs are our priority.  As part of our continuing mission to provide you with exceptional heart care, we have created designated Provider Care Teams.  These Care Teams include your primary Cardiologist (physician) and Advanced Practice Providers (APPs -  Physician Assistants and Nurse Practitioners) who all work together to provide you with the care you need, when you need it.  We recommend signing up for the patient portal called "MyChart".  Sign up information is provided on this After Visit Summary.  MyChart is used to connect with patients for Virtual Visits (Telemedicine).  Patients are able to view lab/test results, encounter notes, upcoming appointments, etc.  Non-urgent messages can be sent to your provider as well.   To learn more about what you can do with MyChart, go to [email or web address].    Your next appointment:   12 month(s)  The format for your next appointment:   In Person  Provider:   You may see Nelva Bush, MD or one of the following Advanced Practice Providers on your designated Care Team:   Murray Hodgkins, NP Christell Faith, PA-C Cadence Kathlen Mody, PA-C Gerrie Nordmann, NP  Important Information About Sugar

## 2022-06-22 ENCOUNTER — Encounter: Payer: Self-pay | Admitting: Gastroenterology

## 2022-06-23 ENCOUNTER — Ambulatory Visit
Admission: RE | Admit: 2022-06-23 | Discharge: 2022-06-23 | Disposition: A | Discharge status: Home / Self Care | Payer: PPO | Attending: Gastroenterology | Admitting: Gastroenterology

## 2022-06-23 ENCOUNTER — Ambulatory Visit: Payer: PPO | Admitting: Certified Registered"

## 2022-06-23 ENCOUNTER — Encounter
Admission: RE | Disposition: A | Discharge status: Home / Self Care | Payer: Self-pay | Source: Home / Self Care | Attending: Gastroenterology

## 2022-06-23 ENCOUNTER — Encounter: Payer: Self-pay | Admitting: Gastroenterology

## 2022-06-23 DIAGNOSIS — Z860100 Personal history of colon polyps, unspecified: Secondary | ICD-10-CM

## 2022-06-23 DIAGNOSIS — Z79899 Other long term (current) drug therapy: Secondary | ICD-10-CM | POA: Insufficient documentation

## 2022-06-23 DIAGNOSIS — Z1211 Encounter for screening for malignant neoplasm of colon: Secondary | ICD-10-CM | POA: Diagnosis not present

## 2022-06-23 DIAGNOSIS — D122 Benign neoplasm of ascending colon: Secondary | ICD-10-CM | POA: Diagnosis not present

## 2022-06-23 DIAGNOSIS — Z87891 Personal history of nicotine dependence: Secondary | ICD-10-CM | POA: Diagnosis not present

## 2022-06-23 DIAGNOSIS — D126 Benign neoplasm of colon, unspecified: Secondary | ICD-10-CM

## 2022-06-23 DIAGNOSIS — K635 Polyp of colon: Secondary | ICD-10-CM | POA: Diagnosis not present

## 2022-06-23 DIAGNOSIS — I110 Hypertensive heart disease with heart failure: Secondary | ICD-10-CM | POA: Diagnosis not present

## 2022-06-23 DIAGNOSIS — Z8673 Personal history of transient ischemic attack (TIA), and cerebral infarction without residual deficits: Secondary | ICD-10-CM | POA: Insufficient documentation

## 2022-06-23 DIAGNOSIS — I509 Heart failure, unspecified: Secondary | ICD-10-CM | POA: Insufficient documentation

## 2022-06-23 HISTORY — PX: COLONOSCOPY WITH PROPOFOL: SHX5780

## 2022-06-23 SURGERY — COLONOSCOPY WITH PROPOFOL
Anesthesia: General

## 2022-06-23 MED ORDER — LIDOCAINE HCL (CARDIAC) PF 100 MG/5ML IV SOSY
PREFILLED_SYRINGE | INTRAVENOUS | Status: DC | PRN
  Administered 2022-06-23: 40 mg via INTRAVENOUS

## 2022-06-23 MED ORDER — PROPOFOL 10 MG/ML IV BOLUS
INTRAVENOUS | Status: DC | PRN
  Administered 2022-06-23: 10 mg via INTRAVENOUS
  Administered 2022-06-23: 30 mg via INTRAVENOUS
  Administered 2022-06-23: 50 mg via INTRAVENOUS
  Administered 2022-06-23: 10 mg via INTRAVENOUS

## 2022-06-23 MED ORDER — SODIUM CHLORIDE 0.9 % IV SOLN
INTRAVENOUS | Status: DC
  Administered 2022-06-23: 1000 mL via INTRAVENOUS

## 2022-06-23 MED ORDER — OXYCODONE HCL 5 MG PO TABS
ORAL_TABLET | ORAL | Status: AC
  Filled 2022-06-23: qty 1

## 2022-06-23 MED ORDER — PROPOFOL 500 MG/50ML IV EMUL
INTRAVENOUS | Status: DC | PRN
  Administered 2022-06-23: 155 ug/kg/min via INTRAVENOUS

## 2022-06-23 NOTE — Anesthesia Procedure Notes (Signed)
Procedure Name: General with mask airway Date/Time: 06/23/2022 12:17 PM  Performed by: Kelton Pillar, CRNAPre-anesthesia Checklist: Patient identified, Emergency Drugs available, Suction available and Patient being monitored Patient Re-evaluated:Patient Re-evaluated prior to induction Oxygen Delivery Method: Simple face mask Induction Type: IV induction Placement Confirmation: positive ETCO2, CO2 detector and breath sounds checked- equal and bilateral Dental Injury: Teeth and Oropharynx as per pre-operative assessment

## 2022-06-23 NOTE — Anesthesia Postprocedure Evaluation (Signed)
Anesthesia Post Note  Patient: Jeffrey [name]  Procedure(s) Performed: COLONOSCOPY WITH PROPOFOL  Patient location during evaluation: Endoscopy Anesthesia Type: General Level of consciousness: awake and alert Pain management: pain level controlled Vital Signs Assessment: post-procedure vital signs reviewed and stable Respiratory status: spontaneous breathing, nonlabored ventilation, respiratory function stable and patient connected to nasal cannula oxygen Cardiovascular status: blood pressure returned to baseline and stable Postop Assessment: no apparent nausea or vomiting Anesthetic complications: no   No notable events documented.   Last Vitals:  Vitals:   06/23/22 1243 06/23/22 1253  BP: 119/73 120/67  Pulse: (!) 49 (!) 48  Resp: 20 20  Temp:    SpO2: 98% 100%    Last Pain:  Vitals:   06/23/22 1253  TempSrc:   PainSc: 0-No pain                 Ilene Qua

## 2022-06-23 NOTE — Op Note (Signed)
[facility] Gastroenterology Patient Name: Jeffrey [name] Procedure Date: 06/23/2022 11:53 AM MRN: [id number] Account #: [id number] Date of Birth: 09-29-1941 Admit Type: Outpatient Age: 81 Room: [facility] ENDO ROOM 2 Gender: Male Note Status: Finalized Instrument Name: Jasper Riling [id number] Procedure:             Colonoscopy Indications:           Screening for colorectal malignant neoplasm,                         inadequate bowel prep on last colonoscopy (more recent                         than 10 years ago) Providers:             Jonathon Bellows MD, MD Referring MD:          Jonathon Bellows MD, MD (Referring MD), Wynelle Fanny. Tower                         (Referring MD) Medicines:             Monitored Anesthesia Care Complications:         No immediate complications. Procedure:             Pre-Anesthesia Assessment:                        - Prior to the procedure, a History and Physical was                         performed, and patient medications, allergies and                         sensitivities were reviewed. The patient's tolerance                         of previous anesthesia was reviewed.                        - The risks and benefits of the procedure and the                         sedation options and risks were discussed with the                         patient. All questions were answered and informed                         consent was obtained.                        - ASA Grade Assessment: II - A patient with mild                         systemic disease.                        After obtaining informed consent, the colonoscope was                         passed under direct vision.  Throughout the procedure,                         the patient's blood pressure, pulse, and oxygen                         saturations were monitored continuously. The                         Colonoscope was introduced through the anus and                         advanced to the the  cecum, identified by the                         appendiceal orifice. The colonoscopy was performed                         with ease. The patient tolerated the procedure well.                         The quality of the bowel preparation was excellent. Findings:      The perianal and digital rectal examinations were normal.      Two sessile polyps were found in the ascending colon. The polyps were 4       to 5 mm in size. These polyps were removed with a cold snare. Resection       and retrieval were complete.      A 5 mm polyp was found in the transverse colon. The polyp was sessile.       The polyp was removed with a cold snare. Resection and retrieval were       complete.      The exam was otherwise without abnormality on direct and retroflexion       views. Impression:            - Two 4 to 5 mm polyps in the ascending colon, removed                         with a cold snare. Resected and retrieved.                        - One 5 mm polyp in the transverse colon, removed with                         a cold snare. Resected and retrieved.                        - The examination was otherwise normal on direct and                         retroflexion views. Recommendation:        - Discharge patient to home (with escort).                        - Resume previous diet.                        - Continue present medications.                        -  Await pathology results.                        - Repeat colonoscopy is not recommended due to current                         age (71 years or older) for screening purposes. Procedure Code(s):     --- Professional ---                        [phone], Colonoscopy, flexible; with removal of                         tumor(s), polyp(s), or other lesion(s) by snare                         technique Diagnosis Code(s):     --- Professional ---                        K63.5, Polyp of colon                        Z12.11, Encounter for screening for  malignant neoplasm                         of colon CPT copyright 2019 American Medical Association. All rights reserved. The codes documented in this report are preliminary and upon coder review may  be revised to meet current compliance requirements. Jonathon Bellows, MD Jonathon Bellows MD, MD 06/23/2022 12:31:53 PM This report has been signed electronically. Number of Addenda: 0 Note Initiated On: 06/23/2022 11:53 AM Scope Withdrawal Time: 0 hours 10 minutes 48 seconds  Total Procedure Duration: 0 hours 15 minutes 49 seconds  Estimated Blood Loss:  Estimated blood loss: none.      [facility]

## 2022-06-23 NOTE — Anesthesia Preprocedure Evaluation (Signed)
Anesthesia Evaluation  Patient identified by MRN, date of birth, ID band Patient awake    Reviewed: Allergy & Precautions, NPO status , Patient's Chart, lab work & pertinent test results  History of Anesthesia Complications Negative for: history of anesthetic complications  Airway Mallampati: II  TM Distance: >3 FB Neck ROM: Full    Dental no notable dental hx.    Pulmonary neg pulmonary ROS, former smoker,    Pulmonary exam normal breath sounds clear to auscultation       Cardiovascular Exercise Tolerance: Good hypertension, Pt. on medications +CHF  Normal cardiovascular exam Rhythm:Regular Rate:Normal     Neuro/Psych TIACVA, No Residual Symptoms negative psych ROS   GI/Hepatic negative GI ROS, Neg liver ROS, Bowel prep,gastritis   Endo/Other  negative endocrine ROS  Renal/GU negative Renal ROS  negative genitourinary   Musculoskeletal  (+) Arthritis , DDD   Abdominal   Peds negative pediatric ROS (+)  Hematology negative hematology ROS (+)   Anesthesia Other Findings   Reproductive/Obstetrics negative OB ROS                             Anesthesia Physical Anesthesia Plan  ASA: 2  Anesthesia Plan: General   Post-op Pain Management: Minimal or no pain anticipated   Induction: Intravenous  PONV Risk Score and Plan: 1 and Propofol infusion and TIVA  Airway Management Planned: Natural Airway and Nasal Cannula  Additional Equipment:   Intra-op Plan:   Post-operative Plan:   Informed Consent: I have reviewed the patients History and Physical, chart, labs and discussed the procedure including the risks, benefits and alternatives for the proposed anesthesia with the patient or authorized representative who has indicated his/her understanding and acceptance.     Dental Advisory Given  Plan Discussed with: Anesthesiologist, CRNA and Surgeon  Anesthesia Plan Comments:  (Patient consented for risks of anesthesia including but not limited to:  - adverse reactions to medications - risk of airway placement if required - damage to eyes, teeth, lips or other oral mucosa - nerve damage due to positioning  - sore throat or hoarseness - Damage to heart, brain, nerves, lungs, other parts of body or loss of life  Patient voiced understanding.)        Anesthesia Quick Evaluation

## 2022-06-23 NOTE — H&P (Signed)
Jeffrey Bellows, MD [address] [place], [place], [place], [address] [address], [place], [place], [place], [address] Phone: [phone]  Fax: [phone]  Primary Care Physician:  Tower, Wynelle Fanny, MD   Pre-Procedure History & Physical: HPI:  Jeffrey [name] is a 81 y.o. male is here for an colonoscopy.   Past Medical History:  Diagnosis Date   DDD (degenerative disc disease)    with epidural injection   Gastritis    H pylori, partial tx.  EGD 04/2002   Gout    History of skin cancer    Hyperlipidemia    Hypertension    some white coat component- better at home   Stroke [facility])    TIA (transient ischemic attack)     Past Surgical History:  Procedure Laterality Date   COLONOSCOPY WITH PROPOFOL N/A 01/16/2018   Procedure: COLONOSCOPY WITH PROPOFOL;  Surgeon: Lollie Sails, MD;  Location: [facility] ENDOSCOPY;  Service: Endoscopy;  Laterality: N/A;   COLONOSCOPY WITH PROPOFOL N/A 12/10/2021   Procedure: COLONOSCOPY WITH PROPOFOL;  Surgeon: Jeffrey Bellows, MD;  Location: [facility] ENDOSCOPY;  Service: Gastroenterology;  Laterality: N/A;   HEMORRHOID SURGERY  1997   LOOP RECORDER INSERTION N/A 12/21/2016   Procedure: Loop Recorder Insertion;  Surgeon: Deboraha Sprang, MD;  Location: [facility] CV LAB;  Service: Cardiovascular;  Laterality: N/A;   TEE WITHOUT CARDIOVERSION N/A 12/21/2016   Procedure: TRANSESOPHAGEAL ECHOCARDIOGRAM (TEE);  Surgeon: Nelva Bush, MD;  Location: [facility] ORS;  Service: Cardiovascular;  Laterality: N/A;    Prior to Admission medications   Medication Sig Start Date End Date Taking? Authorizing Provider  allopurinol (ZYLOPRIM) 300 MG tablet TAKE 1 TABLET(300 MG) BY MOUTH DAILY AT NIGHT 03/16/22  Yes Tower, Marne A, MD  amLODipine (NORVASC) 10 MG tablet Take 1 tablet (10 mg total) by mouth daily. 11/07/20  Yes Ward, Delice Bison, DO  Ascorbic Acid (VITAMIN C) 1000 MG tablet Take 1,000 mg by mouth daily.   Yes [provider]  aspirin EC 81 MG tablet Take  243 mg by mouth daily. Swallow whole.   Yes [provider]  atorvastatin (LIPITOR) 20 MG tablet Take 10 mg by mouth daily.   Yes [provider]  bisoprolol (ZEBETA) 5 MG tablet TAKE 1 AND 1/2 TABLETS(7.5 MG) BY MOUTH EVERY DAY 01/25/22  Yes Tower, Marne A, MD  hydrochlorothiazide (HYDRODIURIL) 25 MG tablet TAKE 1 TABLET(25 MG) BY MOUTH DAILY 12/15/21  Yes End, Harrell Gave, MD  lisinopril (ZESTRIL) 40 MG tablet TAKE 1 TABLET(40 MG) BY MOUTH DAILY 12/15/21  Yes End, Harrell Gave, MD  Omega-3 Fatty Acids (FISH OIL PO) Take 1 capsule by mouth daily.   Yes [provider]  VITAMIN E PO Take 1 capsule by mouth daily.   Yes [provider]    Allergies as of 05/26/2022   (No Known Allergies)    Family History  Problem Relation Age of Onset   Other Mother        brain tumor   Skin cancer Mother        suspect melanoma   Heart attack Father 38   Colon cancer Sister     Social History   Socioeconomic History   Marital status: Married    Spouse name: Not on file   Number of children: 3   Years of education: HS   Highest education level: Not on file  Occupational History   Occupation: [employer]  Tobacco Use   Smoking status:  Former    Packs/day: 1.00    Years: 8.00    Total pack years: 8.00    Types: Cigarettes    Quit date: 1973    Years since quitting: 50.7   Smokeless tobacco: Never  Vaping Use   Vaping Use: Never used  Substance and Sexual Activity   Alcohol use: Yes    Alcohol/week: 7.0 standard drinks of alcohol    Types: 7 Glasses of wine per week    Comment: weekly-with dinner; 12/09/2021 "sometimes"   Drug use: No   Sexual activity: Yes  Other Topics Concern   Not on file  Social History Narrative   From [place]   Drinks 1 caffeine drink a day    Social Determinants of Health   Financial Resource Strain: Not on file  Food Insecurity: Not on file  Transportation Needs: Not on file  Physical Activity: Not on file   Stress: Not on file  Social Connections: Not on file  Intimate Partner Violence: Not on file    Review of Systems: See HPI, otherwise negative ROS  Physical Exam: There were no vitals taken for this visit. General:   Alert,  pleasant and cooperative in NAD Head:  Normocephalic and atraumatic. Neck:  Supple; no masses or thyromegaly. Lungs:  Clear throughout to auscultation, normal respiratory effort.    Heart:  +S1, +S2, Regular rate and rhythm, No edema. Abdomen:  Soft, nontender and nondistended. Normal bowel sounds, without guarding, and without rebound.   Neurologic:  Alert and  oriented x4;  grossly normal neurologically.  Impression/Plan: [name] is here for an colonoscopy to be performed for Screening colonoscopy average risk   Risks, benefits, limitations, and alternatives regarding  colonoscopy have been reviewed with the patient.  Questions have been answered.  All parties agreeable.   Jeffrey Bellows, MD  06/23/2022, 11:51 AM

## 2022-06-23 NOTE — Transfer of Care (Signed)
Immediate Anesthesia Transfer of Care Note  Patient: Jeffrey [name]  Procedure(s) Performed: COLONOSCOPY WITH PROPOFOL  Patient Location: Endoscopy Unit  Anesthesia Type:General  Level of Consciousness: drowsy and patient cooperative  Airway & Oxygen Therapy: Patient Spontanous Breathing and Patient connected to face mask oxygen  Post-op Assessment: Report given to RN and Post -op Vital signs reviewed and stable  Post vital signs: Reviewed and stable  Last Vitals:  Vitals Value Taken Time  BP    Temp    Pulse    Resp    SpO2      Last Pain:  Vitals:   06/23/22 1153  PainSc: 0-No pain         Complications: No notable events documented.

## 2022-06-24 ENCOUNTER — Encounter: Payer: Self-pay | Admitting: Gastroenterology

## 2022-06-24 LAB — SURGICAL PATHOLOGY

## 2022-07-02 ENCOUNTER — Other Ambulatory Visit: Payer: Self-pay | Admitting: *Deleted

## 2022-07-02 MED ORDER — LISINOPRIL 40 MG PO TABS: ORAL_TABLET | ORAL | 3 refills | Status: DC

## 2022-07-05 ENCOUNTER — Other Ambulatory Visit: Payer: Self-pay

## 2022-07-05 MED ORDER — HYDROCHLOROTHIAZIDE 25 MG PO TABS: ORAL_TABLET | ORAL | 0 refills | Status: DC

## 2022-09-29 ENCOUNTER — Other Ambulatory Visit: Payer: Self-pay | Admitting: Internal Medicine

## 2022-11-17 ENCOUNTER — Ambulatory Visit (INDEPENDENT_AMBULATORY_CARE_PROVIDER_SITE_OTHER): Payer: PPO | Admitting: Family Medicine

## 2022-11-17 ENCOUNTER — Encounter: Payer: Self-pay | Admitting: Family Medicine

## 2022-11-17 VITALS — BP 122/72 | HR 56 | Temp 97.7°F | Ht 71.25 in | Wt 185.2 lb

## 2022-11-17 DIAGNOSIS — M25541 Pain in joints of right hand: Secondary | ICD-10-CM | POA: Diagnosis not present

## 2022-11-17 DIAGNOSIS — R5382 Chronic fatigue, unspecified: Secondary | ICD-10-CM | POA: Diagnosis not present

## 2022-11-17 DIAGNOSIS — M7918 Myalgia, other site: Secondary | ICD-10-CM | POA: Diagnosis not present

## 2022-11-17 DIAGNOSIS — D696 Thrombocytopenia, unspecified: Secondary | ICD-10-CM | POA: Diagnosis not present

## 2022-11-17 DIAGNOSIS — M25542 Pain in joints of left hand: Secondary | ICD-10-CM | POA: Diagnosis not present

## 2022-11-17 DIAGNOSIS — R5383 Other fatigue: Secondary | ICD-10-CM | POA: Insufficient documentation

## 2022-11-17 DIAGNOSIS — R509 Fever, unspecified: Secondary | ICD-10-CM

## 2022-11-17 DIAGNOSIS — R52 Pain, unspecified: Secondary | ICD-10-CM | POA: Diagnosis not present

## 2022-11-17 DIAGNOSIS — M2550 Pain in unspecified joint: Secondary | ICD-10-CM | POA: Insufficient documentation

## 2022-11-17 LAB — COMPREHENSIVE METABOLIC PANEL
ALT: 28 U/L (ref 0–53)
AST: 22 U/L (ref 0–37)
Albumin: 4.5 g/dL (ref 3.5–5.2)
Alkaline Phosphatase: 53 U/L (ref 39–117)
BUN: 11 mg/dL (ref 6–23)
CO2: 32 mEq/L (ref 19–32)
Calcium: 9 mg/dL (ref 8.4–10.5)
Chloride: 100 mEq/L (ref 96–112)
Creatinine, Ser: 0.8 mg/dL (ref 0.40–1.50)
GFR: 84.14 mL/min (ref >60.00)
Glucose, Bld: 141 mg/dL — ABNORMAL HIGH (ref 70–99)
Potassium: 3.8 mEq/L (ref 3.5–5.1)
Sodium: 140 mEq/L (ref 135–145)
Total Bilirubin: 0.7 mg/dL (ref 0.2–1.2)
Total Protein: 6.7 g/dL (ref 6.0–8.3)

## 2022-11-17 LAB — POCT INFLUENZA A/B
Influenza A, POC: NEGATIVE
Influenza B, POC: NEGATIVE

## 2022-11-17 LAB — C-REACTIVE PROTEIN: CRP: <1 mg/dL (ref 0.5–20.0)

## 2022-11-17 LAB — VITAMIN B12: Vitamin B-12: 450 pg/mL (ref 211–911)

## 2022-11-17 LAB — CBC WITH DIFFERENTIAL/PLATELET
Basophils Absolute: 0 10*3/uL (ref 0.0–0.1)
Basophils Relative: 0.6 % (ref 0.0–3.0)
Eosinophils Absolute: 0.3 10*3/uL (ref 0.0–0.7)
Eosinophils Relative: 4.7 % (ref 0.0–5.0)
HCT: 43.6 % (ref 39.0–52.0)
Hemoglobin: 14.9 g/dL (ref 13.0–17.0)
Lymphocytes Relative: 14.1 % (ref 12.0–46.0)
Lymphs Abs: 0.9 10*3/uL (ref 0.7–4.0)
MCHC: 34.3 g/dL (ref 30.0–36.0)
MCV: 95.7 fl (ref 78.0–100.0)
Monocytes Absolute: 0.5 10*3/uL (ref 0.1–1.0)
Monocytes Relative: 7.2 % (ref 3.0–12.0)
Neutro Abs: 4.9 10*3/uL (ref 1.4–7.7)
Neutrophils Relative %: 73.4 % (ref 43.0–77.0)
Platelets: 127 10*3/uL — ABNORMAL LOW (ref 150.0–400.0)
RBC: 4.55 Mil/uL (ref 4.22–5.81)
RDW: 13.4 % (ref 11.5–15.5)
WBC: 6.6 10*3/uL (ref 4.0–10.5)

## 2022-11-17 LAB — VITAMIN D 25 HYDROXY (VIT D DEFICIENCY, FRACTURES): VITD: 19.34 ng/mL — ABNORMAL LOW (ref 30.00–100.00)

## 2022-11-17 LAB — IRON: Iron: 101 ug/dL (ref 42–165)

## 2022-11-17 LAB — POC COVID19 BINAXNOW: SARS Coronavirus 2 Ag: NEGATIVE

## 2022-11-17 LAB — TSH: TSH: 1.66 u[IU]/mL (ref 0.35–5.50)

## 2022-11-17 LAB — SEDIMENTATION RATE: Sed Rate: 7 mm/hr (ref 0–20)

## 2022-11-17 LAB — MAGNESIUM: Magnesium: 1.7 mg/dL (ref 1.5–2.5)

## 2022-11-17 NOTE — Assessment & Plan Note (Signed)
Very stiff/sore hands in am  Mostly mcp joints Nl exam today  Mother had RA Also episodes of feverishness   Labs ordered  Plan to follow  Enc low impact exercise to keep muscle mass as tolerated

## 2022-11-17 NOTE — Assessment & Plan Note (Signed)
Pt feels feverish (no temp) every evening with chills and aches but no sweats  Had some cramps 2 wk ago then took otc mag and K (? How much he has no idea) and that stopped Hand stiffness in am Labs ordered

## 2022-11-17 NOTE — Assessment & Plan Note (Signed)
3-4 weeks  With feverish feeling in the late afternoon/evening (no temp) Achey, and very stiff hand joints in am   Reassuring exam today Labs ordered

## 2022-11-17 NOTE — Assessment & Plan Note (Addendum)
Every evening Takes tylenol  No temp on the thermometer Reassuring exam  Only assoc sympt is hand joint stiffness in am  Has fam h/o RA in mother Lab ordered Neg covid and flu tests today

## 2022-11-17 NOTE — Progress Notes (Signed)
Subjective:    Patient ID: Jeffrey [name], male    DOB: 27-Mar-1941, 82 y.o.   MRN: [id number]  HPI Pt presents with c/o fever   Wt Readings from Last 3 Encounters:  11/17/22 185 lb 4 oz (84 kg)  06/23/22 180 lb 5.7 oz (81.8 kg)  06/10/22 184 lb (83.5 kg)   25.66 kg/m  3-4 wk   Vitals:   11/17/22 0846  BP: 122/72  Pulse: (!) 56  Temp: 97.7 F (36.5 C)  SpO2: 95%   Has been more tired than usual  By 6-7 pm he feels feverish -- last temp was 97.7  Aches all over  Chills , no shaking however  No hot /no sweats   No other symptoms at all  No uri  No ST No bladder or voiding changes No abd pain   No rash   No recent tick bites at all  No new moles or derm issues  No recent travel   In the am wakes up with very stiff hands /difficult to move for hours  No joint swelling but some tenderness No redness or warmth  Takes a few tylenol    Starts to feel better after lunch time   He did have some muscle cramps at night a few weeks ago  Took some otc mag and K and felt better Still taking these ? Doses   Busy schedule  Had to cancel a trip for work  Owns a company   Results for orders placed or performed in visit on 11/17/22  POC COVID-19  Result Value Ref Range   SARS Coronavirus 2 Ag Negative Negative  POCT Influenza A/B  Result Value Ref Range   Influenza A, POC Negative Negative   Influenza B, POC Negative Negative     Lab Results  Component Value Date   WBC 5.0 05/19/2022   HGB 14.2 05/19/2022   HCT 41.4 05/19/2022   MCV 94.7 05/19/2022   PLT 102.0 (L) 05/19/2022   Lab Results  Component Value Date   PSA 2.11 08/18/2020   PSA 1.82 04/23/2019   PSA 1.30 01/11/2018   Last urology visit was in the summer    Had flu shot and covid shot in sept   Mother had RA  Patient Active Problem List   Diagnosis Date Noted   Joint pain 11/17/2022   Feels feverish 11/17/2022   Body aches 11/17/2022   Fatigue 11/17/2022   History of colonic  polyps    Adenomatous polyp of colon    History of stroke 12/05/2020   Heart failure with preserved ejection fraction ([place]) 03/01/2020   CHF (congestive heart failure) ([place]) 02/22/2020   Medicare annual wellness visit, subsequent 04/23/2019   Paresthesia of hand 02/12/2019   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   Cryptogenic stroke ([place]) 11/08/2016   Prostate cancer screening 11/05/2014   Family history of colon cancer 08/01/2013   Colon cancer screening 08/01/2013   Routine general medical examination at a health care facility 08/01/2013   PSA, INCREASED 09/07/2010   HERNIATED DISC 11/24/2009   Thrombocytopenia ([place]) 01/16/2009   Hyperlipidemia LDL goal <70 06/08/2007   Gout 06/08/2007   Primary hypertension 06/08/2007   Prediabetes 06/08/2007   SKIN CANCER, HX OF 06/08/2007   Past Medical History:  Diagnosis Date   DDD (degenerative disc disease)    with epidural injection   Gastritis    H pylori, partial tx.  EGD 04/2002   Gout    History of  skin cancer    Hyperlipidemia    Hypertension    some white coat component- better at home   Stroke [facility])    TIA (transient ischemic attack)    Past Surgical History:  Procedure Laterality Date   COLONOSCOPY WITH PROPOFOL N/A 01/16/2018   Procedure: COLONOSCOPY WITH PROPOFOL;  Surgeon: Lollie Sails, MD;  Location: [facility] ENDOSCOPY;  Service: Endoscopy;  Laterality: N/A;   COLONOSCOPY WITH PROPOFOL N/A 12/10/2021   Procedure: COLONOSCOPY WITH PROPOFOL;  Surgeon: Jonathon Bellows, MD;  Location: [facility] ENDOSCOPY;  Service: Gastroenterology;  Laterality: N/A;   COLONOSCOPY WITH PROPOFOL N/A 06/23/2022   Procedure: COLONOSCOPY WITH PROPOFOL;  Surgeon: Jonathon Bellows, MD;  Location: [facility] ENDOSCOPY;  Service: Gastroenterology;  Laterality: N/A;   HEMORRHOID SURGERY  1997   LOOP RECORDER INSERTION N/A 12/21/2016   Procedure: Loop Recorder Insertion;  Surgeon: Deboraha Sprang, MD;  Location: [place] CV LAB;  Service: Cardiovascular;   Laterality: N/A;   TEE WITHOUT CARDIOVERSION N/A 12/21/2016   Procedure: TRANSESOPHAGEAL ECHOCARDIOGRAM (TEE);  Surgeon: Nelva Bush, MD;  Location: [facility] ORS;  Service: Cardiovascular;  Laterality: N/A;   Social History   Tobacco Use   Smoking status: Former    Packs/day: 1.00    Years: 8.00    Total pack years: 8.00    Types: Cigarettes    Quit date: 42    Years since quitting: 51.1   Smokeless tobacco: Never  Vaping Use   Vaping Use: Never used  Substance Use Topics   Alcohol use: Yes    Alcohol/week: 7.0 standard drinks of alcohol    Types: 7 Glasses of wine per week    Comment: weekly-with dinner; 12/09/2021 "sometimes"   Drug use: No   Family History  Problem Relation Age of Onset   Other Mother        brain tumor   Skin cancer Mother        suspect melanoma   Rheum arthritis Mother    Heart attack Father 7   Colon cancer Sister    No Known Allergies Current Outpatient Medications on File Prior to Visit  Medication Sig Dispense Refill   allopurinol (ZYLOPRIM) 300 MG tablet TAKE 1 TABLET(300 MG) BY MOUTH DAILY AT NIGHT 90 tablet 3   amLODipine (NORVASC) 10 MG tablet Take 1 tablet (10 mg total) by mouth daily. 30 tablet 1   Ascorbic Acid (VITAMIN C) 1000 MG tablet Take 1,000 mg by mouth daily.     aspirin EC 81 MG tablet Take 243 mg by mouth daily. Swallow whole.     atorvastatin (LIPITOR) 20 MG tablet Take 10 mg by mouth daily.     bisoprolol (ZEBETA) 5 MG tablet TAKE 1 AND 1/2 TABLETS(7.5 MG) BY MOUTH EVERY DAY 135 tablet 3   hydrochlorothiazide (HYDRODIURIL) 25 MG tablet TAKE 1 TABLET(25 MG) BY MOUTH DAILY 90 tablet 1   lisinopril (ZESTRIL) 40 MG tablet TAKE 1 TABLET(40 MG) BY MOUTH DAILY 90 tablet 3   Omega-3 Fatty Acids (FISH OIL PO) Take 1 capsule by mouth daily.     VITAMIN E PO Take 1 capsule by mouth daily.     No current facility-administered medications on file prior to visit.    Review of Systems  Constitutional:  Negative for activity change,  appetite change, fatigue, fever and unexpected weight change.       Feels feverish but no elevation in temp  HENT:  Negative for congestion, rhinorrhea, sore throat and trouble swallowing.   Eyes:  Negative for pain, redness, itching and visual disturbance.  Respiratory:  Negative for cough, chest tightness, shortness of breath and wheezing.   Cardiovascular:  Negative for chest pain and palpitations.  Gastrointestinal:  Negative for abdominal pain, blood in stool, constipation, diarrhea and nausea.  Endocrine: Negative for cold intolerance, heat intolerance, polydipsia and polyuria.  Genitourinary:  Negative for difficulty urinating, dysuria, frequency and urgency.  Musculoskeletal:  Positive for arthralgias, myalgias and neck pain. Negative for joint swelling.  Skin:  Negative for pallor and rash.  Neurological:  Negative for dizziness, tremors, weakness, numbness and headaches.  Hematological:  Negative for adenopathy. Does not bruise/bleed easily.  Psychiatric/Behavioral:  Negative for decreased concentration and dysphoric mood. The patient is not nervous/anxious.        Objective:   Physical Exam Constitutional:      General: He is not in acute distress.    Appearance: Normal appearance. He is well-developed and normal weight. He is not ill-appearing or diaphoretic.  HENT:     Head: Normocephalic and atraumatic.     Right Ear: Tympanic membrane, ear canal and external ear normal.     Left Ear: Tympanic membrane, ear canal and external ear normal.     Nose: Nose normal. No congestion.     Mouth/Throat:     Mouth: Mucous membranes are moist.     Pharynx: Oropharynx is clear. No posterior oropharyngeal erythema.  Eyes:     General: No scleral icterus.       Right eye: No discharge.        Left eye: No discharge.     Conjunctiva/sclera: Conjunctivae normal.     Pupils: Pupils are equal, round, and reactive to light.  Neck:     Thyroid: No thyromegaly.     Vascular: No carotid  bruit or JVD.     Comments: No trap or shoulder girdle tenderness Cardiovascular:     Rate and Rhythm: Normal rate and regular rhythm.     Pulses: Normal pulses.     Heart sounds: Normal heart sounds.     No gallop.  Pulmonary:     Effort: Pulmonary effort is normal. No respiratory distress.     Breath sounds: Normal breath sounds. No stridor. No wheezing, rhonchi or rales.     Comments: Good air exch Chest:     Chest wall: No tenderness.  Abdominal:     General: Bowel sounds are normal. There is no distension or abdominal bruit.     Palpations: Abdomen is soft. There is no mass.     Tenderness: There is no abdominal tenderness. There is no right CVA tenderness, left CVA tenderness, guarding or rebound.     Hernia: No hernia is present.  Musculoskeletal:        General: No tenderness or signs of injury.     Cervical back: Normal range of motion and neck supple. No rigidity or tenderness. No muscular tenderness.     Right lower leg: No edema.     Left lower leg: No edema.     Comments: No joint deformities in hands  No joint tenderness or redness today   Per pt- mcp joints were tender earlier this am  Nl rom of hips/knees/elbows/wrists/ankles   Lymphadenopathy:     Cervical: No cervical adenopathy.  Skin:    General: Skin is warm and dry.     Coloration: Skin is not jaundiced or pale.     Findings: No bruising, erythema or rash.  Comments: Baseline lentigines and sk  Neurological:     Mental Status: He is alert.     Cranial Nerves: No cranial nerve deficit.     Motor: No abnormal muscle tone.     Coordination: Coordination normal.     Gait: Gait normal.     Deep Tendon Reflexes: Reflexes are normal and symmetric. Reflexes normal.  Psychiatric:        Mood and Affect: Mood normal.        Cognition and Memory: Cognition normal.     Comments: Candid about stressors Mood is good           Assessment & Plan:   Problem List Items Addressed This Visit        Hematopoietic and Hemostatic   Thrombocytopenia ([place])    Cbc today No bleeding or bruising  Some fatigue/joint pain-likely not related      Relevant Orders   CBC with Differential/Platelet   Iron   ANA Screen,IFA,Reflex Titer/Pattern,Reflex Mplx 11 Ab Cascade with IdentRA     Other   Body aches    Pt feels feverish (no temp) every evening with chills and aches but no sweats  Had some cramps 2 wk ago then took otc mag and K (? How much he has no idea) and that stopped Hand stiffness in am Labs ordered       Relevant Orders   Rheumatoid factor   Sedimentation Rate   C-reactive protein   CBC with Differential/Platelet   Comprehensive metabolic panel   TSH   VITAMIN D 25 Hydroxy (Vit-D Deficiency, Fractures)   Vitamin B12   Iron   Magnesium   ANA Screen,IFA,Reflex Titer/Pattern,Reflex Mplx 11 Ab Cascade with IdentRA   Fatigue    3-4 weeks  With feverish feeling in the late afternoon/evening (no temp) Achey, and very stiff hand joints in am   Reassuring exam today Labs ordered        Relevant Orders   CBC with Differential/Platelet   Comprehensive metabolic panel   TSH   VITAMIN D 25 Hydroxy (Vit-D Deficiency, Fractures)   Vitamin B12   Iron   Magnesium   ANA Screen,IFA,Reflex Titer/Pattern,Reflex Mplx 11 Ab Cascade with IdentRA   Feels feverish    Every evening Takes tylenol  No temp on the thermometer Reassuring exam  Only assoc sympt is hand joint stiffness in am  Has fam h/o RA in mother Lab ordered Neg covid and flu tests today      Relevant Orders   ANA Screen,IFA,Reflex Titer/Pattern,Reflex Mplx 11 Ab Cascade with IdentRA   Joint pain - Primary    Very stiff/sore hands in am  Mostly mcp joints Nl exam today  Mother had RA Also episodes of feverishness   Labs ordered  Plan to follow  Enc low impact exercise to keep muscle mass as tolerated       Relevant Orders   Rheumatoid factor   Sedimentation Rate   C-reactive protein   CBC with  Differential/Platelet   Comprehensive metabolic panel   TSH   VITAMIN D 25 Hydroxy (Vit-D Deficiency, Fractures)   Vitamin B12   Iron   Magnesium   ANA Screen,IFA,Reflex Titer/Pattern,Reflex Mplx 11 Ab Cascade with IdentRA   Other Visit Diagnoses     Fever, unspecified fever cause       Relevant Orders   POC COVID-19 (Completed)   POCT Influenza A/B (Completed)   ANA Screen,IFA,Reflex Titer/Pattern,Reflex Mplx 11 Ab Cascade with [employer]

## 2022-11-17 NOTE — Patient Instructions (Signed)
Go ahead and try a little bit of low impact exercise   Labs today for fatigue and joint pain   If you get worse or develop any new symptoms - let us know  Also if any joint pain or redness or rash    We will let you know as labs return

## 2022-11-17 NOTE — Assessment & Plan Note (Signed)
Cbc today No bleeding or bruising  Some fatigue/joint pain-likely not related

## 2022-11-18 LAB — RHEUMATOID FACTOR: Rheumatoid fact SerPl-aCnc: <14 IU/mL (ref <14)

## 2022-11-22 ENCOUNTER — Telehealth: Payer: Self-pay | Admitting: Family Medicine

## 2022-11-22 DIAGNOSIS — M25541 Pain in joints of right hand: Secondary | ICD-10-CM

## 2022-11-22 DIAGNOSIS — D696 Thrombocytopenia, unspecified: Secondary | ICD-10-CM

## 2022-11-22 DIAGNOSIS — R768 Other specified abnormal immunological findings in serum: Secondary | ICD-10-CM

## 2022-11-22 DIAGNOSIS — R509 Fever, unspecified: Secondary | ICD-10-CM

## 2022-11-22 NOTE — Telephone Encounter (Signed)
I put the referral in  Please let us know if you don't hear in 1-2 weeks   Thanks for the heads up How high is his temp?   Any new symptoms (cold/cough?) He had neg covid and flu testing when he was here

## 2022-11-22 NOTE — Telephone Encounter (Signed)
Patient returned call and said that he's okay with the referral being sent out to the rheumatologist. . He wanted Dr Glori Bickers to know that he has been running a fever today as well.

## 2022-11-22 NOTE — Telephone Encounter (Signed)
Pt notified referral in and advised of Dr. Marliss Coots comments. Pt said he doesn't have a fever he has the exact same sxs as he mentioned to Dr. Glori Bickers he has the "sensation of fever" but when he checked his temp is was 97.7. pt has no new sxs and no URI sxs. Pt just wanted PCP to know he is still having that feeling that he discussed at appt. Pt will wait for call regarding referral.

## 2022-11-22 NOTE — Telephone Encounter (Signed)
Aware, thanks for letting me know  

## 2022-11-23 ENCOUNTER — Encounter: Payer: Self-pay | Admitting: *Deleted

## 2022-11-23 LAB — ANA SCREEN,IFA,REFLEX TITER/PATTERN,REFLEX MPLX 11 AB CASCADE
Anti Nuclear Antibody (ANA): POSITIVE — AB
Cyclic Citrullin Peptide Ab: <16 UNITS
MUTATED CITRULLINATED VIMENTIN (MCV) AB: <20 U/mL (ref <20)
Rheumatoid fact SerPl-aCnc: <14 IU/mL (ref <14)

## 2022-11-23 LAB — TIER 1
Chromatin (Nucleosomal) Antibody: <1 AI
ENA SM Ab Ser-aCnc: <1 AI
Ribonucleic Protein(ENA) Antibody, IgG: <1 AI
SM/RNP: <1 AI
ds DNA Ab: <1 IU/mL

## 2022-11-23 LAB — INTERPRETATION

## 2022-11-23 LAB — TIER 2
Jo-1 Autoabs: <1 AI
SSA (Ro) (ENA) Antibody, IgG: <1 AI
SSB (La) (ENA) Antibody, IgG: <1 AI
Scleroderma (Scl-70) (ENA) Antibody, IgG: <1 AI

## 2022-11-23 LAB — TIER 3
Centromere Ab Screen: <1 AI
Ribosomal P Protein Ab: <1 AI

## 2022-11-23 LAB — ANTI-NUCLEAR AB-TITER (ANA TITER): ANA Titer 1: 1:160 {titer} — ABNORMAL HIGH

## 2022-12-07 ENCOUNTER — Encounter: Payer: Self-pay | Admitting: Family Medicine

## 2022-12-07 ENCOUNTER — Other Ambulatory Visit: Payer: Self-pay | Admitting: Family Medicine

## 2022-12-07 ENCOUNTER — Ambulatory Visit (INDEPENDENT_AMBULATORY_CARE_PROVIDER_SITE_OTHER): Payer: PPO | Admitting: Family Medicine

## 2022-12-07 VITALS — BP 134/78 | HR 64 | Temp 97.2°F | Ht 71.25 in | Wt 188.4 lb

## 2022-12-07 DIAGNOSIS — M25541 Pain in joints of right hand: Secondary | ICD-10-CM | POA: Diagnosis not present

## 2022-12-07 DIAGNOSIS — M25542 Pain in joints of left hand: Secondary | ICD-10-CM | POA: Diagnosis not present

## 2022-12-07 DIAGNOSIS — R5381 Other malaise: Secondary | ICD-10-CM | POA: Diagnosis not present

## 2022-12-07 DIAGNOSIS — R5383 Other fatigue: Secondary | ICD-10-CM | POA: Diagnosis not present

## 2022-12-07 DIAGNOSIS — R52 Pain, unspecified: Secondary | ICD-10-CM

## 2022-12-07 MED ORDER — FLUTICASONE PROPIONATE 50 MCG/ACT NA SUSP: 2.0000 | Freq: Every day | NASAL | 1 refills | Status: DC

## 2022-12-07 MED ORDER — AMOXICILLIN-POT CLAVULANATE 875-125 MG PO TABS: 1.0000 | ORAL_TABLET | Freq: Two times a day (BID) | ORAL | 0 refills | Status: AC

## 2022-12-07 NOTE — Telephone Encounter (Signed)
Message from pharmacy:  **Patient requests 90 days supply**

## 2022-12-07 NOTE — Patient Instructions (Addendum)
Tome augmentin por 10 dias para tratar posible sinusitis. Comienze flonase 2 squirts into each nostril daily.  Dejenos saber si no mejora con este tratamiento, si tiene fiebre >101, o si sintomas empeoran.  May use plain mucinex (guaifenesin) with plenty of fluid to help mobilize mucous

## 2022-12-07 NOTE — Progress Notes (Signed)
Patient ID: Jeffrey [name], male    DOB: 1941-03-26, 82 y.o.   MRN: [id number]  This visit was conducted in person.  BP 134/78   Pulse 64   Temp (!) 97.2 F (36.2 C) (Temporal)   Ht 5' 11.25" (1.81 m)   Wt 188 lb 6 oz (85.4 kg)   SpO2 96%   BMI 26.09 kg/m    CC: feverish, malaise Subjective:   HPI: Jeffrey [name] is a 82 y.o. male presenting on 12/07/2022 for Fever (C/o fever- max 97.7, body aches and HA.  Fever usually occurs at night. Sxs started about 3 wks ago. Neg home Covid test- 11/29/22. )   3 wk h/o body aches, malaise, fatigue worse in evenings and feverish feeling (however no true fever as Tmax 97.7).   Tested negative for COVID 11/29/2022.   Recent workup by PCP for above symptoms along with hand arthralgias and morning stiffness with positive ANA 1:160 dense fine speckled nuclear ANA pattern. ESR/CRP were normal. Pending [facility] rheumatology eval - this is still pending.   Continued joint stiffness to upper body. No hip pain but notes shoulder and hand stiffness.  No joint redness, swelling, warmth.  No ear or tooth pain.  No chills, night sweats, sore throat.  No HA, vision changes, temporal pain, jaw claudication symptoms.  He is managing symptoms with tylenol '1000mg'$  BID.  Mother with h/o RA.   Notes forehead pressure when bending over as well as clear rhinitis in the mornings for several hours after he wakes up.   Had to cancel recent trip due to not feeling well.  No h/o allergic rhinitis.   10/2017 developed similar malaise - self treated with ibuprofen '400mg'$  TID x 3 months. 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 Imaging also showed significant sinusitis - which he never felt symptoms of. Seen by ENT and treated for sinusitis with significant improvement. Wonders if current symptoms could be recurrent sinusitis.      Relevant past medical, surgical, family and social history reviewed and updated as  indicated. Interim medical history since our last visit reviewed. Allergies and medications reviewed and updated. Outpatient Medications Prior to Visit  Medication Sig Dispense Refill   allopurinol (ZYLOPRIM) 300 MG tablet TAKE 1 TABLET(300 MG) BY MOUTH DAILY AT NIGHT 90 tablet 3   amLODipine (NORVASC) 10 MG tablet Take 1 tablet (10 mg total) by mouth daily. 30 tablet 1   Ascorbic Acid (VITAMIN C) 1000 MG tablet Take 1,000 mg by mouth daily.     aspirin EC 81 MG tablet Take 243 mg by mouth daily. Swallow whole.     atorvastatin (LIPITOR) 20 MG tablet Take 10 mg by mouth daily.     bisoprolol (ZEBETA) 5 MG tablet TAKE 1 AND 1/2 TABLETS(7.5 MG) BY MOUTH EVERY DAY 135 tablet 3   hydrochlorothiazide (HYDRODIURIL) 25 MG tablet TAKE 1 TABLET(25 MG) BY MOUTH DAILY 90 tablet 1   lisinopril (ZESTRIL) 40 MG tablet TAKE 1 TABLET(40 MG) BY MOUTH DAILY 90 tablet 3   Omega-3 Fatty Acids (FISH OIL PO) Take 1 capsule by mouth daily.     VITAMIN E PO Take 1 capsule by mouth daily.     No facility-administered medications prior to visit.     Per HPI unless specifically indicated in ROS section below Review of Systems  Objective:  BP 134/78   Pulse 64   Temp (!) 97.2 F (36.2 C) (Temporal)  Ht 5' 11.25" (1.81 m)   Wt 188 lb 6 oz (85.4 kg)   SpO2 96%   BMI 26.09 kg/m   Wt Readings from Last 3 Encounters:  12/07/22 188 lb 6 oz (85.4 kg)  11/17/22 185 lb 4 oz (84 kg)  06/23/22 180 lb 5.7 oz (81.8 kg)      Physical Exam Vitals and nursing note reviewed.  Constitutional:      Appearance: Normal appearance. He is not ill-appearing.  HENT:     Head: Normocephalic and atraumatic.     Right Ear: Hearing, tympanic membrane, ear canal and external ear normal. There is no impacted cerumen.     Left Ear: Hearing, tympanic membrane, ear canal and external ear normal. There is no impacted cerumen.     Nose: Mucosal edema and congestion (L>R) present. No rhinorrhea.     Right Turbinates: Not enlarged or  swollen.     Left Turbinates: Not enlarged or swollen.     Right Sinus: Frontal sinus tenderness (mild pressure) present. No maxillary sinus tenderness.     Left Sinus: Frontal sinus tenderness (mild pressure) present. No maxillary sinus tenderness.     Mouth/Throat:     Mouth: Mucous membranes are moist.     Pharynx: Oropharynx is clear. No oropharyngeal exudate or posterior oropharyngeal erythema.  Eyes:     Extraocular Movements: Extraocular movements intact.     Conjunctiva/sclera: Conjunctivae normal.     Pupils: Pupils are equal, round, and reactive to light.  Cardiovascular:     Rate and Rhythm: Normal rate and regular rhythm.     Pulses: Normal pulses.     Heart sounds: Normal heart sounds. No murmur heard. Pulmonary:     Effort: Pulmonary effort is normal. No respiratory distress.     Breath sounds: Normal breath sounds. No wheezing, rhonchi or rales.  Musculoskeletal:     Cervical back: Normal range of motion and neck supple. No rigidity.     Right lower leg: No edema.     Left lower leg: No edema.  Lymphadenopathy:     Cervical: No cervical adenopathy.  Skin:    General: Skin is warm and dry.     Findings: No rash.  Neurological:     Mental Status: He is alert.  Psychiatric:        Mood and Affect: Mood normal.        Behavior: Behavior normal.       Results for orders placed or performed in visit on 11/17/22  Rheumatoid factor  Result Value Ref Range   Rhuematoid fact SerPl-aCnc <14 <14 IU/mL  Sedimentation Rate  Result Value Ref Range   Sed Rate 7 0 - 20 mm/hr  C-reactive protein  Result Value Ref Range   CRP <1.0 0.5 - 20.0 mg/dL  CBC with Differential/Platelet  Result Value Ref Range   WBC 6.6 4.0 - 10.5 K/uL   RBC 4.55 4.22 - 5.81 Mil/uL   Hemoglobin 14.9 13.0 - 17.0 g/dL   HCT 43.6 39.0 - 52.0 %   MCV 95.7 78.0 - 100.0 fl   MCHC 34.3 30.0 - 36.0 g/dL   RDW 13.4 11.5 - 15.5 %   Platelets 127.0 (L) 150.0 - 400.0 K/uL   Neutrophils Relative % 73.4  43.0 - 77.0 %   Lymphocytes Relative 14.1 12.0 - 46.0 %   Monocytes Relative 7.2 3.0 - 12.0 %   Eosinophils Relative 4.7 0.0 - 5.0 %   Basophils Relative 0.6 0.0 - 3.0 %  Neutro Abs 4.9 1.4 - 7.7 K/uL   Lymphs Abs 0.9 0.7 - 4.0 K/uL   Monocytes Absolute 0.5 0.1 - 1.0 K/uL   Eosinophils Absolute 0.3 0.0 - 0.7 K/uL   Basophils Absolute 0.0 0.0 - 0.1 K/uL  Comprehensive metabolic panel  Result Value Ref Range   Sodium 140 135 - 145 mEq/L   Potassium 3.8 3.5 - 5.1 mEq/L   Chloride 100 96 - 112 mEq/L   CO2 32 19 - 32 mEq/L   Glucose, Bld 141 (H) 70 - 99 mg/dL   BUN 11 6 - 23 mg/dL   Creatinine, Ser 0.80 0.40 - 1.50 mg/dL   Total Bilirubin 0.7 0.2 - 1.2 mg/dL   Alkaline Phosphatase 53 39 - 117 U/L   AST 22 0 - 37 U/L   ALT 28 0 - 53 U/L   Total Protein 6.7 6.0 - 8.3 g/dL   Albumin 4.5 3.5 - 5.2 g/dL   GFR 84.14 >60.00 mL/min   Calcium 9.0 8.4 - 10.5 mg/dL  TSH  Result Value Ref Range   TSH 1.66 0.35 - 5.50 uIU/mL  VITAMIN D 25 Hydroxy (Vit-D Deficiency, Fractures)  Result Value Ref Range   VITD 19.34 (L) 30.00 - 100.00 ng/mL  Vitamin B12  Result Value Ref Range   Vitamin B-12 450 211 - 911 pg/mL  Iron  Result Value Ref Range   Iron 101 42 - 165 ug/dL  Magnesium  Result Value Ref Range   Magnesium 1.7 1.5 - 2.5 mg/dL  ANA Screen,IFA,Reflex Titer/Pattern,Reflex Mplx 11 Ab Cascade with IdentRA  Result Value Ref Range   Anti Nuclear Antibody (ANA) POSITIVE (A) NEGATIVE   Rhuematoid fact SerPl-aCnc 99991111 99991111 IU/mL   Cyclic Citrullin Peptide Ab <16 UNITS   MUTATED CITRULLINATED VIMENTIN (MCV) AB <20 <20 U/mL  Anti-nuclear ab-titer (ANA titer)  Result Value Ref Range   ANA Titer 1 1:160 (H) titer   ANA Pattern 1 Nuclear, Dense Fine Speckled (A)   TIER 1  Result Value Ref Range   ds DNA Ab <1 IU/mL   ENA SM Ab Ser-aCnc <1.0 NEG <1.0 NEG AI   SM/RNP <1.0 NEG <1.0 NEG AI   Ribonucleic Protein(ENA) Antibody, IgG <1.0 NEG <1.0 NEG AI   Chromatin (Nucleosomal) Antibody <1.0 NEG  <1.0 NEG AI  TIER 2  Result Value Ref Range   SSA (Ro) (ENA) Antibody, IgG <1.0 NEG <1.0 NEG AI   SSB (La) (ENA) Antibody, IgG <1.0 NEG <1.0 NEG AI   Scleroderma (Scl-70) (ENA) Antibody, IgG <1.0 NEG <1.0 NEG AI   Jo-1 Autoabs <1.0 NEG <1.0 NEG AI  Tier 3  Result Value Ref Range   Centromere Ab Screen <1.0 NEG <1.0 NEG AI   Ribosomal P Protein Ab <1.0 NEG <1.0 NEG AI  Interpretation  Result Value Ref Range   INTERPRETATION    POC COVID-19  Result Value Ref Range   SARS Coronavirus 2 Ag Negative Negative  POCT Influenza A/B  Result Value Ref Range   Influenza A, POC Negative Negative   Influenza B, POC Negative Negative    Assessment & Plan:   Problem List Items Addressed This Visit     Joint pain    Notes ongoing bilateral hand joint and shoulder joint pain with morning stiffness, without symptoms of active synovitis. See above re rheum eval.       Body aches    Recent workup by PCP reviewed with patient. He did have positive ANA with elevated titers. States  [facility] [facility] never received referral - although it was sent twice over.  Will have our referral coordinator send again and ensure received so he can schedule rheum appt for further eval of this.      Malaise and fatigue - Primary    No true fever.  Nasal exam with evidence of significant nasal mucosal inflammation as well as L sided purulent discharge. With this as well as frontal sinus pressure when bending head forward, reasonable to treat as acute sinusitis with 10d augmentin course and monitor for improvement in malaise. Pt agrees with plan.  Discussed if symptoms resolve but then recur, could consider ENT eval.         Meds ordered this encounter  Medications   amoxicillin-clavulanate (AUGMENTIN) 875-125 MG tablet    Sig: Take 1 tablet by mouth 2 (two) times daily for 10 days.    Dispense:  20 tablet    Refill:  0   fluticasone (FLONASE) 50 MCG/ACT nasal spray    Sig: Place 2 sprays into both nostrils  daily.    Dispense:  16 g    Refill:  1    No orders of the defined types were placed in this encounter.   Patient Instructions  Tome augmentin por 10 dias para tratar posible sinusitis. Comienze flonase 2 squirts into each nostril daily.  Dejenos saber si no mejora con este tratamiento, si tiene fiebre >101, o si sintomas empeoran.  May use plain mucinex (guaifenesin) with plenty of fluid to help mobilize mucous  Follow up plan: Return if symptoms worsen or fail to improve.  Ria Bush, MD

## 2022-12-07 NOTE — Assessment & Plan Note (Signed)
No true fever.  Nasal exam with evidence of significant nasal mucosal inflammation as well as L sided purulent discharge. With this as well as frontal sinus pressure when bending head forward, reasonable to treat as acute sinusitis with 10d augmentin course and monitor for improvement in malaise. Pt agrees with plan.  Discussed if symptoms resolve but then recur, could consider ENT eval.

## 2022-12-07 NOTE — Assessment & Plan Note (Addendum)
Notes ongoing bilateral hand joint and shoulder joint pain with morning stiffness, without symptoms of active synovitis. See above re rheum eval.

## 2022-12-07 NOTE — Assessment & Plan Note (Signed)
Recent workup by PCP reviewed with patient. He did have positive ANA with elevated titers. States [facility] never received referral - although it was sent twice over.  Will have our referral coordinator send again and ensure received so he can schedule rheum appt for further eval of this.

## 2022-12-09 NOTE — Telephone Encounter (Signed)
ERx 

## 2022-12-16 ENCOUNTER — Telehealth: Payer: Self-pay | Admitting: Family Medicine

## 2022-12-16 NOTE — Telephone Encounter (Signed)
Called patient to schedule Medicare Annual Wellness Visit (AWV). Left message for patient to call back and schedule Medicare Annual Wellness Visit (AWV).  Last date of AWV: 01/26/2023  Please schedule an appointment at any time with [facility].  If any questions, please contact me at [phone].  Thank you ,  [place] Direct Dial: [phone]

## 2022-12-20 ENCOUNTER — Telehealth: Payer: Self-pay | Admitting: Family Medicine

## 2022-12-20 NOTE — Telephone Encounter (Signed)
[place] to schedule their annual wellness visit. Appointment made for 02/01/2023.  [place] Direct Dial: [phone]

## 2023-01-20 DIAGNOSIS — M159 Polyosteoarthritis, unspecified: Secondary | ICD-10-CM | POA: Diagnosis not present

## 2023-01-20 DIAGNOSIS — R768 Other specified abnormal immunological findings in serum: Secondary | ICD-10-CM | POA: Diagnosis not present

## 2023-01-20 DIAGNOSIS — E559 Vitamin D deficiency, unspecified: Secondary | ICD-10-CM | POA: Diagnosis not present

## 2023-02-01 ENCOUNTER — Encounter: Payer: PPO | Admitting: Family Medicine

## 2023-02-01 DIAGNOSIS — Z0000 Encounter for general adult medical examination without abnormal findings: Secondary | ICD-10-CM

## 2023-02-01 NOTE — Progress Notes (Signed)
This encounter was created in error - please disregard.

## 2023-03-10 ENCOUNTER — Other Ambulatory Visit: Payer: Self-pay | Admitting: Family Medicine

## 2023-03-17 ENCOUNTER — Ambulatory Visit (INDEPENDENT_AMBULATORY_CARE_PROVIDER_SITE_OTHER): Payer: PPO | Admitting: Family

## 2023-03-17 ENCOUNTER — Encounter: Payer: Self-pay | Admitting: Family

## 2023-03-17 VITALS — BP 122/70 | HR 66 | Temp 97.7°F | Ht 71.25 in | Wt 185.4 lb

## 2023-03-17 DIAGNOSIS — H00012 Hordeolum externum right lower eyelid: Secondary | ICD-10-CM | POA: Diagnosis not present

## 2023-03-17 DIAGNOSIS — L539 Erythematous condition, unspecified: Secondary | ICD-10-CM | POA: Diagnosis not present

## 2023-03-17 MED ORDER — SULFAMETHOXAZOLE-TRIMETHOPRIM 800-160 MG PO TABS
1.0000 | ORAL_TABLET | Freq: Two times a day (BID) | ORAL | 0 refills | Status: AC
Start: 2023-03-17 — End: 2023-03-24

## 2023-03-17 NOTE — Assessment & Plan Note (Signed)
As with surrounding erythema choosing to treat with Rx bactrim 800/160 mg po bid x 7 days Advised to use warm compresses as well several times throughout day if no improvement may need to consider opthalmology   Pt advised to:  Please monitor site for worsening signs/symptoms of infection to include: increasing redness, increasing tenderness, increase in size, and or pustulant drainage from site. If this is to occur please let me know immediately.

## 2023-03-17 NOTE — Progress Notes (Signed)
Established Patient Office Visit  Subjective:   Patient ID: Jeffrey [name], male    DOB: June 08, 1941  Age: 82 y.o. MRN: [id number]  CC:  Chief Complaint  Patient presents with   Facial Swelling    Right eye is swollen and red    HPI: Jeffrey [name] is a 82 y.o. male presenting on 03/17/2023 for Facial Swelling (Right eye is swollen and red)  HPI  Four days ago started with redness and crusting below the eye. No change in vision. No discharge from eye. Eye is not painful.      ROS: Negative unless specifically indicated above in HPI.   Relevant past medical history reviewed and updated as indicated.   Allergies and medications reviewed and updated.   Current Outpatient Medications:    allopurinol (ZYLOPRIM) 300 MG tablet, TAKE 1 TABLET(300 MG) BY MOUTH DAILY AT NIGHT, Disp: 90 tablet, Rfl: 3   amLODipine (NORVASC) 10 MG tablet, Take 1 tablet (10 mg total) by mouth daily., Disp: 30 tablet, Rfl: 1   Ascorbic Acid (VITAMIN C) 1000 MG tablet, Take 1,000 mg by mouth daily., Disp: , Rfl:    aspirin EC 81 MG tablet, Take 243 mg by mouth daily. Swallow whole., Disp: , Rfl:    atorvastatin (LIPITOR) 20 MG tablet, Take 10 mg by mouth daily., Disp: , Rfl:    bisoprolol (ZEBETA) 5 MG tablet, TAKE 1 AND 1/2 TABLETS(7.5 MG) BY MOUTH EVERY DAY, Disp: 135 tablet, Rfl: 0   fluticasone (FLONASE) 50 MCG/ACT nasal spray, SHAKE LIQUID AND USE 2 SPRAYS IN EACH NOSTRIL DAILY, Disp: 48 g, Rfl: 0   hydrochlorothiazide (HYDRODIURIL) 25 MG tablet, TAKE 1 TABLET(25 MG) BY MOUTH DAILY, Disp: 90 tablet, Rfl: 1   lisinopril (ZESTRIL) 40 MG tablet, TAKE 1 TABLET(40 MG) BY MOUTH DAILY, Disp: 90 tablet, Rfl: 3   Omega-3 Fatty Acids (FISH OIL PO), Take 1 capsule by mouth daily., Disp: , Rfl:    sulfamethoxazole-trimethoprim (BACTRIM DS) 800-160 MG tablet, Take 1 tablet by mouth 2 (two) times daily for 7 days., Disp: 14 tablet, Rfl: 0   VITAMIN E PO, Take 1 capsule by mouth daily., Disp: , Rfl:   No Known  Allergies  Objective:   BP 122/70   Pulse 66   Temp 97.7 F (36.5 C) (Temporal)   Ht 5' 11.25" (1.81 m)   Wt 185 lb 6.4 oz (84.1 kg)   SpO2 98%   BMI 25.68 kg/m    Physical Exam Vitals reviewed.  Eyes:     Comments: Right lower lid with orbital surrounding erythema  Red inflamed stye with slight yellow discharge evidenced on internal right lower eyelid     Assessment & Plan:  Hordeolum externum of right lower eyelid Assessment & Plan: As with surrounding erythema choosing to treat with Rx bactrim 800/160 mg po bid x 7 days Advised to use warm compresses as well several times throughout day if no improvement may need to consider opthalmology   Pt advised to:  Please monitor site for worsening signs/symptoms of infection to include: increasing redness, increasing tenderness, increase in size, and or pustulant drainage from site. If this is to occur please let me know immediately.    Orders: -     Sulfamethoxazole-Trimethoprim; Take 1 tablet by mouth 2 (two) times daily for 7 days.  Dispense: 14 tablet; Refill: 0  Erythema of skin of eyelid -     Sulfamethoxazole-Trimethoprim; Take 1 tablet by mouth 2 (two) times daily for 7  days.  Dispense: 14 tablet; Refill: 0     Follow up plan: Return for f/u PCP if no improvement in symptoms.  Mort Sawyers, FNP

## 2023-03-24 ENCOUNTER — Other Ambulatory Visit: Payer: Self-pay | Admitting: Family Medicine

## 2023-03-24 NOTE — Telephone Encounter (Signed)
Please re-schedule annual exam.

## 2023-03-24 NOTE — Telephone Encounter (Signed)
Pt has had a recent acute appt with another provider but pt no-showed his CPE with PCP in April, please advise

## 2023-03-25 NOTE — Telephone Encounter (Signed)
lvmtcb

## 2023-03-29 ENCOUNTER — Other Ambulatory Visit: Payer: Self-pay | Admitting: Internal Medicine

## 2023-04-04 DIAGNOSIS — M069 Rheumatoid arthritis, unspecified: Secondary | ICD-10-CM | POA: Diagnosis not present

## 2023-04-04 DIAGNOSIS — M068A Other specified rheumatoid arthritis, other specified site: Secondary | ICD-10-CM | POA: Diagnosis not present

## 2023-04-04 DIAGNOSIS — M2550 Pain in unspecified joint: Secondary | ICD-10-CM | POA: Diagnosis not present

## 2023-05-02 ENCOUNTER — Telehealth: Payer: Self-pay

## 2023-05-16 ENCOUNTER — Other Ambulatory Visit: Payer: Self-pay | Admitting: Family Medicine

## 2023-05-23 ENCOUNTER — Ambulatory Visit (INDEPENDENT_AMBULATORY_CARE_PROVIDER_SITE_OTHER): Payer: PPO | Admitting: Family Medicine

## 2023-05-23 ENCOUNTER — Encounter: Payer: Self-pay | Admitting: Family Medicine

## 2023-05-23 VITALS — BP 134/82 | HR 57 | Temp 97.8°F | Ht 71.25 in | Wt 178.4 lb

## 2023-05-23 DIAGNOSIS — Z8673 Personal history of transient ischemic attack (TIA), and cerebral infarction without residual deficits: Secondary | ICD-10-CM | POA: Diagnosis not present

## 2023-05-23 DIAGNOSIS — I10 Essential (primary) hypertension: Secondary | ICD-10-CM

## 2023-05-23 DIAGNOSIS — I509 Heart failure, unspecified: Secondary | ICD-10-CM | POA: Diagnosis not present

## 2023-05-23 DIAGNOSIS — Z86010 Personal history of colonic polyps: Secondary | ICD-10-CM

## 2023-05-23 DIAGNOSIS — Z0000 Encounter for general adult medical examination without abnormal findings: Secondary | ICD-10-CM

## 2023-05-23 DIAGNOSIS — D696 Thrombocytopenia, unspecified: Secondary | ICD-10-CM | POA: Diagnosis not present

## 2023-05-23 DIAGNOSIS — E785 Hyperlipidemia, unspecified: Secondary | ICD-10-CM | POA: Diagnosis not present

## 2023-05-23 DIAGNOSIS — R972 Elevated prostate specific antigen [PSA]: Secondary | ICD-10-CM | POA: Diagnosis not present

## 2023-05-23 DIAGNOSIS — D126 Benign neoplasm of colon, unspecified: Secondary | ICD-10-CM

## 2023-05-23 DIAGNOSIS — R7303 Prediabetes: Secondary | ICD-10-CM | POA: Diagnosis not present

## 2023-05-23 DIAGNOSIS — M109 Gout, unspecified: Secondary | ICD-10-CM

## 2023-05-23 DIAGNOSIS — E559 Vitamin D deficiency, unspecified: Secondary | ICD-10-CM | POA: Diagnosis not present

## 2023-05-23 DIAGNOSIS — R768 Other specified abnormal immunological findings in serum: Secondary | ICD-10-CM

## 2023-05-23 DIAGNOSIS — I77810 Thoracic aortic ectasia: Secondary | ICD-10-CM | POA: Diagnosis not present

## 2023-05-23 LAB — LIPID PANEL
Cholesterol: 108 mg/dL (ref 0–200)
HDL: 39.6 mg/dL (ref >39.00)
LDL Cholesterol: 35 mg/dL (ref 0–99)
NonHDL: 68.35
Total CHOL/HDL Ratio: 3
Triglycerides: 167 mg/dL — ABNORMAL HIGH (ref 0.0–149.0)
VLDL: 33.4 mg/dL (ref 0.0–40.0)

## 2023-05-23 LAB — CBC WITH DIFFERENTIAL/PLATELET
Basophils Absolute: 0.1 10*3/uL (ref 0.0–0.1)
Basophils Relative: 1 % (ref 0.0–3.0)
Eosinophils Absolute: 1.1 10*3/uL — ABNORMAL HIGH (ref 0.0–0.7)
Eosinophils Relative: 13.1 % — ABNORMAL HIGH (ref 0.0–5.0)
HCT: 45.5 % (ref 39.0–52.0)
Hemoglobin: 14.9 g/dL (ref 13.0–17.0)
Lymphocytes Relative: 13.7 % (ref 12.0–46.0)
Lymphs Abs: 1.1 10*3/uL (ref 0.7–4.0)
MCHC: 32.8 g/dL (ref 30.0–36.0)
MCV: 97.4 fl (ref 78.0–100.0)
Monocytes Absolute: 0.5 10*3/uL (ref 0.1–1.0)
Monocytes Relative: 6.5 % (ref 3.0–12.0)
Neutro Abs: 5.4 10*3/uL (ref 1.4–7.7)
Neutrophils Relative %: 65.7 % (ref 43.0–77.0)
Platelets: 125 10*3/uL — ABNORMAL LOW (ref 150.0–400.0)
RBC: 4.67 Mil/uL (ref 4.22–5.81)
RDW: 14 % (ref 11.5–15.5)
WBC: 8.2 10*3/uL (ref 4.0–10.5)

## 2023-05-23 LAB — COMPREHENSIVE METABOLIC PANEL
ALT: 19 U/L (ref 0–53)
AST: 20 U/L (ref 0–37)
Albumin: 4.3 g/dL (ref 3.5–5.2)
Alkaline Phosphatase: 49 U/L (ref 39–117)
BUN: 17 mg/dL (ref 6–23)
CO2: 29 mEq/L (ref 19–32)
Calcium: 9.3 mg/dL (ref 8.4–10.5)
Chloride: 100 mEq/L (ref 96–112)
Creatinine, Ser: 0.92 mg/dL (ref 0.40–1.50)
GFR: 78.8 mL/min (ref >60.00)
Glucose, Bld: 131 mg/dL — ABNORMAL HIGH (ref 70–99)
Potassium: 3.5 mEq/L (ref 3.5–5.1)
Sodium: 139 mEq/L (ref 135–145)
Total Bilirubin: 0.8 mg/dL (ref 0.2–1.2)
Total Protein: 6.4 g/dL (ref 6.0–8.3)

## 2023-05-23 LAB — HEMOGLOBIN A1C: Hgb A1c MFr Bld: 5.9 % (ref 4.6–6.5)

## 2023-05-23 LAB — URIC ACID: Uric Acid, Serum: 5.3 mg/dL (ref 4.0–7.8)

## 2023-05-23 LAB — TSH: TSH: 1.03 u[IU]/mL (ref 0.35–5.50)

## 2023-05-23 NOTE — Assessment & Plan Note (Signed)
Finished high dose weekly from Dr Allena Katz  On over the counter D3 now

## 2023-05-23 NOTE — Assessment & Plan Note (Signed)
A1c ordered disc imp of low glycemic diet and wt loss to prevent DM2  

## 2023-05-23 NOTE — Assessment & Plan Note (Signed)
Pt did see Dr Allena Katz- rheum  Thought to be false positive  Has OA

## 2023-05-23 NOTE — Patient Instructions (Addendum)
If you are interested in the 2nd shingles vaccine (Shingrix) - call your local pharmacy to check on coverage and availability  If affordable, get on a wait list at your pharmacy to get the vaccine.  Get a flu shot in the fall   You are due for tetanus shot November or later  Check with your pharmacy about that also   Keep exercising  Add some strength training to your routine, this is important for bone and brain health and can reduce your risk of falls and help your body use insulin properly and regulate weight  Light weights, exercise bands , and internet videos are a good way to start  Yoga (chair or regular), machines , floor exercises or a gym with machines are also good options    Take care of yourself   Please use sun protection to avoid skin cancer

## 2023-05-23 NOTE — Progress Notes (Signed)
kk  Subjective:    Patient ID: Jeffrey [name], male    DOB: [birth year], 82 y.o.   MRN: [id number]  HPI  Here for health maintenance exam and to review chronic medical problems   Wt Readings from Last 3 Encounters:  05/23/23 178 lb 6 oz (80.9 kg)  03/17/23 185 lb 6.4 oz (84.1 kg)  12/07/22 188 lb 6 oz (85.4 kg)   24.70 kg/m  Vitals:   05/23/23 1022  BP: 134/82  Pulse: (!) 57  Temp: 97.8 F (36.6 C)  SpO2: 95%    Immunization History  Administered Date(s) Administered   Fluad Quad(high Dose 65+) 08/21/2020   Hepatitis A, Adult 07/21/2016   Influenza Split 08/25/2011   Influenza,inj,Quad PF,6+ Mos 08/01/2013, 11/05/2014, 07/21/2016   PFIZER(Purple Top)SARS-COV-2 Vaccination 11/16/2019, 12/11/2019, 07/25/2020   Pneumococcal Conjugate-13 11/05/2014   Pneumococcal Polysaccharide-23 08/25/2011   Td 04/01/2003, 08/01/2013   Zoster Recombinant(Shingrix) 07/24/2019    There are no preventive care reminders to display for this patient.  Had a bout of RMSF after mowing Is better now  Was treated   Saw Tabitha for stye on eyelid- better now    Shingrix vaccine-had first one in [place]  Did not feel great after it  Does plan to get the 2nd one   Flu vaccine -will get in the fall   Is due for tetanus shot in oct    Prostate health Lab Results  Component Value Date   PSA 2.11 08/18/2020   PSA 1.82 04/23/2019   PSA 1.30 01/11/2018   Nocturia - 2-3 times  Can tolerate that  Stream is baseline  Has not been back to urology for anything     Colon cancer screening -colonoscopy 06/2022  Had small polyps Did not recommend recall  Bone health   Falls-none  Fractures-none  Supplements -vitamin D  Exercise -walking / also Sudie Grumbling and tennis  Uses 5 lb weight    Mood    05/23/2023   10:24 AM 03/17/2023   10:01 AM 12/07/2022   11:16 AM 11/17/2022    8:55 AM 01/25/2022   10:49 AM  Depression screen PHQ 2/9  Decreased Interest 0 0 0 0 0  Down, Depressed,  Hopeless 0 0 0 0 0  PHQ - 2 Score 0 0 0 0 0  Altered sleeping 0   0   Tired, decreased energy 0   1   Change in appetite 0   0   Feeling bad or failure about yourself  0   0   Trouble concentrating 0   0   Moving slowly or fidgety/restless 0   0   Suicidal thoughts 0   0   PHQ-9 Score 0   1   Difficult doing work/chores Not difficult at all   Not difficult at all     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  bp is stable today  No cp or palpitations or headaches or edema  No side effects to medicines  BP Readings from Last 3 Encounters:  05/23/23 134/82  03/17/23 122/70  12/07/22 134/78    Bisoprolol 5 mg daily  Lisinopril 40 mg daily  Amlodipine 10 mg daily  Hctz 25 mg daily   History of AA dilatation and CHF -sees cardiology Next visit September   No more stroke symptoms  Takes asa    Hyperlipidemia Lab Results  Component Value Date   CHOL 117 01/25/2022   HDL 49.00 01/25/2022   LDLCALC 32 01/25/2022  LDLDIRECT 49.0 08/18/2020   TRIG 177.0 (H) 01/25/2022   CHOLHDL 2 01/25/2022   Due for labs  Atorvastatin 10 mg daily Fish oil   History of gout  Allopurinol 300 mg daily  Lab Results  Component Value Date   LABURIC 5.9 08/18/2020    Prediabetes Lab Results  Component Value Date   HGBA1C 6.4 05/19/2022     History of low platelets  Lab Results  Component Value Date   WBC 6.6 11/17/2022   HGB 14.9 11/17/2022   HCT 43.6 11/17/2022   MCV 95.7 11/17/2022   PLT 127.0 (L) 11/17/2022   Due for labs   Patient Active Problem List   Diagnosis Date Noted   Vitamin D deficiency 05/23/2023   Positive ANA (antinuclear antibody) 11/22/2022   Joint pain 11/17/2022   History of colonic polyps    Adenomatous polyp of colon    History of stroke 12/05/2020   Heart failure with preserved ejection fraction (HCC) 03/01/2020   CHF (congestive heart failure) (HCC) 02/22/2020   Ascending aorta dilation (HCC) 02/22/2017   Family history of colon cancer  08/01/2013   Routine general medical examination at a health care facility 08/01/2013   PSA, INCREASED 09/07/2010   HERNIATED DISC 11/24/2009   Thrombocytopenia (HCC) 01/16/2009   Hyperlipidemia LDL goal <70 06/08/2007   Gout 06/08/2007   Primary hypertension 06/08/2007   Prediabetes 06/08/2007   SKIN CANCER, HX OF 06/08/2007   Past Medical History:  Diagnosis Date   DDD (degenerative disc disease)    with epidural injection   Gastritis    H pylori, partial tx.  EGD 04/2002   Gout    History of skin cancer    Hyperlipidemia    Hypertension    some white coat component- better at home   Stroke [facility])    TIA (transient ischemic attack)    Past Surgical History:  Procedure Laterality Date   COLONOSCOPY WITH PROPOFOL N/A 01/16/2018   Procedure: COLONOSCOPY WITH PROPOFOL;  Surgeon: Christena Deem, MD;  Location: [facility] ENDOSCOPY;  Service: Endoscopy;  Laterality: N/A;   COLONOSCOPY WITH PROPOFOL N/A 12/10/2021   Procedure: COLONOSCOPY WITH PROPOFOL;  Surgeon: Wyline Mood, MD;  Location: [facility] ENDOSCOPY;  Service: Gastroenterology;  Laterality: N/A;   COLONOSCOPY WITH PROPOFOL N/A 06/23/2022   Procedure: COLONOSCOPY WITH PROPOFOL;  Surgeon: Wyline Mood, MD;  Location: [facility] ENDOSCOPY;  Service: Gastroenterology;  Laterality: N/A;   HEMORRHOID SURGERY  1997   LOOP RECORDER INSERTION N/A 12/21/2016   Procedure: Loop Recorder Insertion;  Surgeon: Duke Salvia, MD;  Location: [facility] INVASIVE CV LAB;  Service: Cardiovascular;  Laterality: N/A;   TEE WITHOUT CARDIOVERSION N/A 12/21/2016   Procedure: TRANSESOPHAGEAL ECHOCARDIOGRAM (TEE);  Surgeon: Yvonne Kendall, MD;  Location: [facility] ORS;  Service: Cardiovascular;  Laterality: N/A;   Social History   Tobacco Use   Smoking status: Former    Current packs/day: 0.00    Average packs/day: 1 pack/day for 8.0 years (8.0 ttl pk-yrs)    Types: Cigarettes    Start date: 67    Quit date: 59    Years since quitting: 51.6   Smokeless tobacco:  Never  Vaping Use   Vaping status: Never Used  Substance Use Topics   Alcohol use: Yes    Alcohol/week: 7.0 standard drinks of alcohol    Types: 7 Glasses of wine per week    Comment: weekly-with dinner; 12/09/2021 "sometimes"   Drug use: No   Family History  Problem Relation Age of Onset  Other Mother        brain tumor   Skin cancer Mother        suspect melanoma   Rheum arthritis Mother    Heart attack Father 52   Colon cancer Sister    No Known Allergies Current Outpatient Medications on File Prior to Visit  Medication Sig Dispense Refill   allopurinol (ZYLOPRIM) 300 MG tablet TAKE 1 TABLET(300 MG) BY MOUTH DAILY AT NIGHT 90 tablet 0   amLODipine (NORVASC) 10 MG tablet Take 1 tablet (10 mg total) by mouth daily. 30 tablet 1   Ascorbic Acid (VITAMIN C) 1000 MG tablet Take 1,000 mg by mouth daily.     aspirin EC 81 MG tablet Take 243 mg by mouth daily. Swallow whole.     atorvastatin (LIPITOR) 20 MG tablet TAKE 1 TABLET(20 MG) BY MOUTH DAILY (Patient taking differently: Take 10 mg by mouth daily.) 90 tablet 0   bisoprolol (ZEBETA) 5 MG tablet TAKE 1 AND 1/2 TABLETS(7.5 MG) BY MOUTH EVERY DAY (Patient taking differently: Take 5 mg by mouth daily.) 135 tablet 0   ergocalciferol (VITAMIN D2) 1.25 MG (50000 UT) capsule Take 50,000 Units by mouth once a week.     fluticasone (FLONASE) 50 MCG/ACT nasal spray SHAKE LIQUID AND USE 2 SPRAYS IN EACH NOSTRIL DAILY 48 g 0   hydrochlorothiazide (HYDRODIURIL) 25 MG tablet TAKE 1 TABLET(25 MG) BY MOUTH DAILY 90 tablet 0   lisinopril (ZESTRIL) 40 MG tablet TAKE 1 TABLET(40 MG) BY MOUTH DAILY 90 tablet 3   Omega-3 Fatty Acids (FISH OIL PO) Take 1 capsule by mouth daily.     VITAMIN E PO Take 1 capsule by mouth daily.     No current facility-administered medications on file prior to visit.    Review of Systems  Constitutional:  Negative for activity change, appetite change, fatigue, fever and unexpected weight change.  HENT:  Negative for  congestion, rhinorrhea, sore throat and trouble swallowing.   Eyes:  Negative for pain, redness, itching and visual disturbance.  Respiratory:  Negative for cough, chest tightness, shortness of breath and wheezing.   Cardiovascular:  Negative for chest pain and palpitations.  Gastrointestinal:  Negative for abdominal pain, blood in stool, constipation, diarrhea and nausea.  Endocrine: Negative for cold intolerance, heat intolerance, polydipsia and polyuria.  Genitourinary:  Negative for difficulty urinating, dysuria, frequency and urgency.  Musculoskeletal:  Positive for arthralgias. Negative for joint swelling and myalgias.  Skin:  Negative for pallor and rash.  Neurological:  Negative for dizziness, tremors, weakness, numbness and headaches.  Hematological:  Negative for adenopathy. Does not bruise/bleed easily.  Psychiatric/Behavioral:  Negative for decreased concentration and dysphoric mood. The patient is not nervous/anxious.        Objective:   Physical Exam Constitutional:      General: He is not in acute distress.    Appearance: Normal appearance. He is well-developed and normal weight. He is not ill-appearing or diaphoretic.  HENT:     Head: Normocephalic and atraumatic.     Right Ear: Tympanic membrane, ear canal and external ear normal.     Left Ear: Tympanic membrane, ear canal and external ear normal.     Nose: Nose normal. No congestion.     Mouth/Throat:     Mouth: Mucous membranes are moist.     Pharynx: Oropharynx is clear. No posterior oropharyngeal erythema.  Eyes:     General: No scleral icterus.       Right eye: No discharge.  Left eye: No discharge.     Conjunctiva/sclera: Conjunctivae normal.     Pupils: Pupils are equal, round, and reactive to light.  Neck:     Thyroid: No thyromegaly.     Vascular: No carotid bruit or JVD.  Cardiovascular:     Rate and Rhythm: Normal rate and regular rhythm.     Pulses: Normal pulses.     Heart sounds: Normal  heart sounds.     No gallop.  Pulmonary:     Effort: Pulmonary effort is normal. No respiratory distress.     Breath sounds: Normal breath sounds. No wheezing or rales.     Comments: Good air exch Chest:     Chest wall: No tenderness.  Abdominal:     General: Bowel sounds are normal. There is no distension or abdominal bruit.     Palpations: Abdomen is soft. There is no mass.     Tenderness: There is no abdominal tenderness.     Hernia: No hernia is present.  Musculoskeletal:        General: No tenderness.     Cervical back: Normal range of motion and neck supple. No rigidity. No muscular tenderness.     Right lower leg: No edema.     Left lower leg: No edema.  Lymphadenopathy:     Cervical: No cervical adenopathy.  Skin:    General: Skin is warm and dry.     Coloration: Skin is not pale.     Findings: No erythema or rash.     Comments: Tanned/dark complexion Solar lentigines diffusely Solar aging   Neurological:     Mental Status: He is alert.     Cranial Nerves: No cranial nerve deficit.     Motor: No abnormal muscle tone.     Coordination: Coordination normal.     Gait: Gait normal.     Deep Tendon Reflexes: Reflexes are normal and symmetric. Reflexes normal.  Psychiatric:        Mood and Affect: Mood normal.        Cognition and Memory: Cognition and memory normal.           Assessment & Plan:   Problem List Items Addressed This Visit       Cardiovascular and Mediastinum   Primary hypertension    bp in fair control at this time  BP Readings from Last 1 Encounters:  05/23/23 134/82   No changes needed Most recent labs reviewed  Disc lifstyle change with low sodium diet and exercise  Plan to continue Bisoprolol 5 mg daily Lisinopril 40 mg daily  Amlodipine 10 mg daily  Hctz 25 mg daily       Relevant Orders   TSH   Lipid panel   Comprehensive metabolic panel   CBC with Differential/Platelet   CHF (congestive heart failure) (HCC)    Due for  cardiology follow up this fall No clinical changes Continues lisinopril 40 mg and bisoprolol 5 mg daily      Ascending aorta dilation (HCC)    Continues with cardiology visits- due this fall No symptoms  Blood pressure is controlled         Digestive   Adenomatous polyp of colon    Colonoscopy was 06/2022  Due to age no recall recommended         Hematopoietic and Hemostatic   Thrombocytopenia (HCC)    Cbc today No new bruising or bleeding       Relevant Orders   CBC with  Differential/Platelet     Other   Vitamin D deficiency    Finished high dose weekly from Dr Allena Katz  On over the counter D3 now        Routine general medical examination at a health care facility - Primary    Reviewed health habits including diet and exercise and skin cancer prevention Reviewed appropriate screening tests for age  Also reviewed health mt list, fam hx and immunization status , as well as social and family history   See HPI Labs reviewed and ordered Plans to get 2nd shingrix vaccine  Plans flu shot in fall  May consider Td at pharmacy- due November or later  No clinical changes in prostate health / psa of 2.11 Colonoscopy utd 06/2022 without recall No falls or fractures- takiing vit D (was on high dose weekly from Dr Allena Katz in past) PHQ of 0 Strongly encouraged use of sun protection       PSA, INCREASED    Lab Results  Component Value Date   PSA 2.11 08/18/2020   PSA 1.82 04/23/2019   PSA 1.30 01/11/2018    As expected with age /enl prostate Tolerates current urinary situation/ nocturia and does not desire treatment       Prediabetes    A1c ordered disc imp of low glycemic diet and wt loss to prevent DM2       Relevant Orders   Hemoglobin A1c   Positive ANA (antinuclear antibody)    Pt did see Dr Allena Katz- rheum  Thought to be false positive  Has OA      Hyperlipidemia LDL goal <70    Disc goals for lipids and reasons to control them Rev last labs with pt Rev low  sat fat diet in detail  Has been well controlled with atorvastatin 10 mg Also takes fish oil  Under cardiology care Lab today      Relevant Orders   TSH   Lipid panel   Comprehensive metabolic panel   History of stroke    No further symptoms  Takes asa 243 mg daily  Blood pressure is controlled       History of colonic polyps    06/2022  No recall due to age       Gout    Takes allopurinol 300 mg daily  No flares at all Watches diet  Uric acid level ordered       Relevant Orders   Uric acid

## 2023-05-23 NOTE — Assessment & Plan Note (Signed)
Due for cardiology follow up this fall No clinical changes Continues lisinopril 40 mg and bisoprolol 5 mg daily

## 2023-05-23 NOTE — Assessment & Plan Note (Signed)
bp in fair control at this time  BP Readings from Last 1 Encounters:  05/23/23 134/82   No changes needed Most recent labs reviewed  Disc lifstyle change with low sodium diet and exercise  Plan to continue Bisoprolol 5 mg daily Lisinopril 40 mg daily  Amlodipine 10 mg daily  Hctz 25 mg daily

## 2023-05-23 NOTE — Assessment & Plan Note (Signed)
No further symptoms  Takes asa 243 mg daily  Blood pressure is controlled

## 2023-05-23 NOTE — Assessment & Plan Note (Signed)
Colonoscopy was 06/2022  Due to age no recall recommended

## 2023-05-23 NOTE — Assessment & Plan Note (Signed)
Lab Results  Component Value Date   PSA 2.11 08/18/2020   PSA 1.82 04/23/2019   PSA 1.30 01/11/2018    As expected with age /enl prostate Tolerates current urinary situation/ nocturia and does not desire treatment

## 2023-05-23 NOTE — Assessment & Plan Note (Signed)
Takes allopurinol 300 mg daily  No flares at all Watches diet  Uric acid level ordered

## 2023-05-23 NOTE — Assessment & Plan Note (Signed)
06/2022  No recall due to age

## 2023-05-23 NOTE — Assessment & Plan Note (Signed)
Disc goals for lipids and reasons to control them Rev last labs with pt Rev low sat fat diet in detail  Has been well controlled with atorvastatin 10 mg Also takes fish oil  Under cardiology care Lab today

## 2023-05-23 NOTE — Assessment & Plan Note (Signed)
Continues with cardiology visits- due this fall No symptoms  Blood pressure is controlled

## 2023-05-23 NOTE — Assessment & Plan Note (Signed)
Cbc today  No new bruising or bleeding

## 2023-05-23 NOTE — Assessment & Plan Note (Signed)
Reviewed health habits including diet and exercise and skin cancer prevention Reviewed appropriate screening tests for age  Also reviewed health mt list, fam hx and immunization status , as well as social and family history   See HPI Labs reviewed and ordered Plans to get 2nd shingrix vaccine  Plans flu shot in fall  May consider Td at pharmacy- due November or later  No clinical changes in prostate health / psa of 2.11 Colonoscopy utd 06/2022 without recall No falls or fractures- takiing vit D (was on high dose weekly from Dr Allena Katz in past) PHQ of 0 Strongly encouraged use of sun protection

## 2023-06-22 ENCOUNTER — Other Ambulatory Visit: Payer: Self-pay | Admitting: Family Medicine

## 2023-07-04 ENCOUNTER — Other Ambulatory Visit: Payer: Self-pay | Admitting: Internal Medicine

## 2023-07-04 ENCOUNTER — Other Ambulatory Visit: Payer: Self-pay | Admitting: Family Medicine

## 2023-07-04 NOTE — Telephone Encounter (Signed)
Hi,  Could you schedule this patient a 12 month follow up visit? The patient was last seen by Dr. Okey Dupre on 06-10-2022. Thank you so much.

## 2023-07-05 NOTE — Telephone Encounter (Signed)
Left voicemail to schedule follow up appt

## 2023-07-25 ENCOUNTER — Other Ambulatory Visit: Payer: Self-pay | Admitting: Internal Medicine

## 2023-07-28 ENCOUNTER — Telehealth: Payer: Self-pay | Admitting: Internal Medicine

## 2023-07-28 MED ORDER — LISINOPRIL 40 MG PO TABS: ORAL_TABLET | ORAL | 0 refills | Status: DC

## 2023-07-28 NOTE — Telephone Encounter (Signed)
*  STAT* If patient is at the pharmacy, call can be transferred to refill team.   1. Which medications need to be refilled? (please list name of each medication and dose if known) lisinopril (ZESTRIL) 40 MG tablet    2. Would you like to learn more about the convenience, safety, & potential cost savings by using the [facility]? No      3. Are you open to using the [facility] (Type [facility]. No ).   4. Which pharmacy/location (including street and city if local pharmacy) is medication to be sent to? [id number] - [place], [place] - [address] AT [address] OF [address]    5. Do they need a 30 day or 90 day supply? 90    TAKE 1 TABLET(40 MG) BY MOUTH DAILY PLEASE KEEP APPT SCHEDULED 08/04/23 FOR FUTURE REFILLS. (FIRST ATTEMPT)

## 2023-07-28 NOTE — Addendum Note (Signed)
Addended by: Auburn Bilberry D on: 07/28/2023 09:42 AM   Modules accepted: Orders

## 2023-08-01 ENCOUNTER — Other Ambulatory Visit: Payer: Self-pay

## 2023-08-01 MED ORDER — LISINOPRIL 40 MG PO TABS: ORAL_TABLET | ORAL | 0 refills | Status: DC

## 2023-08-02 ENCOUNTER — Other Ambulatory Visit: Payer: Self-pay | Admitting: Internal Medicine

## 2023-08-02 ENCOUNTER — Other Ambulatory Visit: Payer: Self-pay | Admitting: Family Medicine

## 2023-08-04 ENCOUNTER — Ambulatory Visit: Payer: PPO | Admitting: Internal Medicine

## 2023-08-30 ENCOUNTER — Other Ambulatory Visit: Payer: Self-pay | Admitting: Internal Medicine

## 2023-08-30 NOTE — Telephone Encounter (Signed)
last visit 06/10/22 with plan to f/u in12 months  next visit: 08/31/23

## 2023-08-31 ENCOUNTER — Ambulatory Visit: Payer: PPO | Attending: Internal Medicine | Admitting: Internal Medicine

## 2023-08-31 ENCOUNTER — Encounter: Payer: Self-pay | Admitting: Internal Medicine

## 2023-08-31 VITALS — BP 119/80 | HR 52 | Ht 72.0 in | Wt 179.6 lb

## 2023-08-31 DIAGNOSIS — I5032 Chronic diastolic (congestive) heart failure: Secondary | ICD-10-CM

## 2023-08-31 DIAGNOSIS — I1A0 Resistant hypertension: Secondary | ICD-10-CM | POA: Insufficient documentation

## 2023-08-31 DIAGNOSIS — Z8673 Personal history of transient ischemic attack (TIA), and cerebral infarction without residual deficits: Secondary | ICD-10-CM | POA: Diagnosis not present

## 2023-08-31 DIAGNOSIS — E785 Hyperlipidemia, unspecified: Secondary | ICD-10-CM | POA: Diagnosis not present

## 2023-08-31 NOTE — Patient Instructions (Signed)
 Medication Instructions:  Your physician recommends that you continue on your current medications as directed. Please refer to the Current Medication list given to you today.   *If you need a refill on your cardiac medications before your next appointment, please call your pharmacy*   Lab Work: No labs ordered today    Testing/Procedures: No test ordered today    Follow-Up: At [facility], you and your health needs are our priority.  As part of our continuing mission to provide you with exceptional heart care, we have created designated Provider Care Teams.  These Care Teams include your primary Cardiologist (physician) and Advanced Practice Providers (APPs -  Physician Assistants and Nurse Practitioners) who all work together to provide you with the care you need, when you need it.  We recommend signing up for the patient portal called "MyChart".  Sign up information is provided on this After Visit Summary.  MyChart is used to connect with patients for Virtual Visits (Telemedicine).  Patients are able to view lab/test results, encounter notes, upcoming appointments, etc.  Non-urgent messages can be sent to your provider as well.   To learn more about what you can do with MyChart, go to [email or web address].    Your next appointment:   1 year(s)  Provider:   You may see Yvonne Kendall, MD or one of the following Advanced Practice Providers on your designated Care Team:   Nicolasa Ducking, NP Eula Listen, PA-C Cadence Fransico Michael, PA-C Charlsie Quest, NP Carlos Levering, NP

## 2023-08-31 NOTE — Progress Notes (Signed)
  Cardiology Office Note:  .   Date:  08/31/2023  ID:  Jeffrey [name], DOB January 24, 1941, MRN [id number] PCP: Judy Pimple, MD  [facility] Providers Cardiologist:  Yvonne Kendall, MD     History of Present Illness: .   Jeffrey [name] is a 82 y.o. male with history of stroke, HFpEF, hypertension, and hyperlipidemia, who presents for follow-up of stroke, hypertension, and HFpEF.  I last saw him in 05/2022, at which time he was doing well.  We did not make any medication changes or pursue additional testing.  Today, [name] reports that he continues to feel well without chest pain, shortness of breath, palpitations, lightheadedness, edema, or focal neurologic changes.  He is tolerating his medications well.  ROS: See HPI  Studies Reviewed: Marland Kitchen   EKG Interpretation Date/Time:  Wednesday August 31 2023 10:51:05 EST Ventricular Rate:  52 PR Interval:  156 QRS Duration:  98 QT Interval:  470 QTC Calculation: 437 R Axis:   -20  Text Interpretation: Sinus bradycardia Low voltage QRS Borderline ECG When compared with ECG of 10-Jun-2022 No significant change was found Confirmed by Curley Fayette, Cristal Deer [phone]) on 08/31/2023 10:58:16 AM    Risk Assessment/Calculations:             Physical Exam:   VS:  BP 119/80 (BP Location: Left Arm, Patient Position: Sitting, Cuff Size: Normal)   Pulse (!) 52   Ht 6' (1.829 m)   Wt 179 lb 9.6 oz (81.5 kg)   SpO2 98%   BMI 24.36 kg/m    Wt Readings from Last 3 Encounters:  08/31/23 179 lb 9.6 oz (81.5 kg)  05/23/23 178 lb 6 oz (80.9 kg)  03/17/23 185 lb 6.4 oz (84.1 kg)    General:  NAD. Neck: No JVD or HJR. Lungs: Clear to auscultation bilaterally without wheezes or crackles. Heart: Bradycardic but regular without murmurs, rubs, or gallops. Abdomen: Soft, nontender, nondistended. Extremities: No lower extremity edema.  ASSESSMENT AND PLAN: .    Chronic HFpEF: [name] appears euvolemic with NYHA class I symptoms.  We defer medication  changes and additional testing today.  History of stroke: No new neurologic symptoms reported.  Prior loop recorded did not show any evidence of a-fib/flutter.  Continue aspirin, statin therapy, and ongoing BP control.  Resistant hypertension: BP has been well-controlled at home and is also at goal in the office today.  Continue current regimen of amlodipine, bisoprolol, lisinopril, and hydrochlorothiazide.  Mild sinus bradycardia noted again today; defer stopping bisoprolol given lack of symptoms.  Hyperlipidemia: LDL very well-controlled (35) on last check in August.  Triglycerides mildly elevated at that time.  Continue atorvastatin 20 mg daily.    Dispo: Return to clinic in 1 year.  Signed, Yvonne Kendall, MD

## 2023-09-22 ENCOUNTER — Other Ambulatory Visit: Payer: Self-pay | Admitting: Internal Medicine

## 2023-10-03 ENCOUNTER — Other Ambulatory Visit: Payer: Self-pay | Admitting: Internal Medicine

## 2023-10-04 ENCOUNTER — Other Ambulatory Visit: Payer: Self-pay | Admitting: Family Medicine

## 2023-11-09 ENCOUNTER — Other Ambulatory Visit: Payer: Self-pay | Admitting: Family Medicine

## 2023-11-09 MED ORDER — ATORVASTATIN CALCIUM 20 MG PO TABS: 20.0000 mg | ORAL_TABLET | Freq: Every day | ORAL | 1 refills | Status: DC

## 2024-01-03 ENCOUNTER — Other Ambulatory Visit: Payer: Self-pay | Admitting: Family Medicine

## 2024-01-04 NOTE — Telephone Encounter (Signed)
 Last filled on 10/04/23 #90 tabs/ 0 refills.  CPE was on 05/23/23

## 2024-04-24 ENCOUNTER — Other Ambulatory Visit: Payer: Self-pay | Admitting: Internal Medicine

## 2024-05-08 ENCOUNTER — Ambulatory Visit: Payer: Self-pay

## 2024-05-08 NOTE — Telephone Encounter (Signed)
 Aware Thanks for seeing him

## 2024-05-08 NOTE — Telephone Encounter (Signed)
 FYI Only or Action Required?: FYI only for provider.  Patient was last seen in primary care on 05/23/2023 by Jeffrey Laine LABOR, MD.  Called Nurse Triage reporting skin lesion.  Symptoms began about a month ago.  Interventions attempted: OTC medications: cortisone.  Symptoms are: gradually worsening.  Triage Disposition: Home Care  Patient/caregiver understands and will follow disposition?: Yes  Copied from CRM [id number]. Topic: Clinical - Red Word Triage >> May 08, 2024  9:30 AM Mesmerise C wrote: [facility] that prompted transfer to Nurse Triage: Patient stated he had something appear on his lower back states it the size of a quarter and is red  Along with itchy has been ongoing for a month now and has been growing bigger Reason for Disposition  1 or 2 small sores  Answer Assessment - Initial Assessment Questions 1. APPEARANCE of SORES: What do the sores look like?     Red, flat, intact skin 2. NUMBER: How many sores are there?     one 3. SIZE: How big is the largest sore?     Quarter sized 4. LOCATION: Where are the sores located?     Low back 5. ONSET: When did the sores begin?     About a month ago 6. TENDER: Does it hurt when you touch it?  (Scale 1-10; or mild, moderate, severe)      Non tender, but does itch 7. CAUSE: What do you think is causing the sores?     unknown 8. OTHER SYMPTOMS: Do you have any other symptoms? (e.g., fever, new weakness)     Denies any other symptoms  Protocols used: Sores-A-AH

## 2024-05-14 ENCOUNTER — Ambulatory Visit (INDEPENDENT_AMBULATORY_CARE_PROVIDER_SITE_OTHER): Admitting: Family Medicine

## 2024-05-14 ENCOUNTER — Encounter: Payer: Self-pay | Admitting: Family Medicine

## 2024-05-14 VITALS — BP 124/82 | HR 65 | Temp 98.6°F | Ht 72.0 in | Wt 181.2 lb

## 2024-05-14 DIAGNOSIS — L989 Disorder of the skin and subcutaneous tissue, unspecified: Secondary | ICD-10-CM | POA: Diagnosis not present

## 2024-05-14 NOTE — Progress Notes (Signed)
 Ph: [phone] Fax: [phone]   Patient ID: Jeffrey [name], male    DOB: October 17, 1940, 83 y.o.   MRN: [id number]  This visit was conducted in person.  BP 124/82   Pulse 65   Temp 98.6 F (37 C) (Oral)   Ht 6' (1.829 m)   Wt 181 lb 4 oz (82.2 kg)   SpO2 98%   BMI 24.58 kg/m    CC: low back itching  Subjective:   HPI: Jeffrey [name] is a 83 y.o. male presenting on 05/14/2024 for Pruritis (Pt C/O Lower back itching for 2 weeks. Tried using cortisone for itching but itching returns.)   2 month h/o red spot to left lower back associated with itching. Itching improved with OTC cortizone-10. Quarter size spot to back, enlarging.   No recent tick bite.  No fevers/chills, abd pain, joint pain, fatigue, nausea, headache.   No new foods, meds, supplements, no new lotions, detergents, soaps or shampoos.   He overall feels well - enjoys playing tennis 3d/wk with son.   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 Didn't need Mohs.  Doesn't use sunscreen.   H/o RMSF after mowing lawn.  H/o cryptogenic stroke on aspirin 243mg  daily, atorvastatin  20mg  daily.  Lab Results  Component Value Date   HGBA1C 5.9 05/23/2023   H/o gout on allopurinol  300mg  daily.  Lab Results  Component Value Date   LABURIC 5.3 05/23/2023   Lab Results  Component Value Date   TSH 1.03 05/23/2023    Lab Results  Component Value Date   NA 139 05/23/2023   CL 100 05/23/2023   K 3.5 05/23/2023   CO2 29 05/23/2023   BUN 17 05/23/2023   CREATININE 0.92 05/23/2023   GFR 78.80 05/23/2023   CALCIUM  9.3 05/23/2023   PHOS 2.7 09/02/2010   ALBUMIN 4.3 05/23/2023   GLUCOSE 131 (H) 05/23/2023    Lab Results  Component Value Date   WBC 8.2 05/23/2023   HGB 14.9 05/23/2023   HCT 45.5 05/23/2023   MCV 97.4 05/23/2023   PLT 125.0 (L) 05/23/2023    Lab Results  Component Value Date   ALT 19 05/23/2023   AST 20 05/23/2023   ALKPHOS 49 05/23/2023   BILITOT 0.8 05/23/2023       Relevant past  medical, surgical, family and social history reviewed and updated as indicated. Interim medical history since our last visit reviewed. Allergies and medications reviewed and updated. Outpatient Medications Prior to Visit  Medication Sig Dispense Refill   allopurinol  (ZYLOPRIM ) 300 MG tablet TAKE 1 TABLET(300 MG) BY MOUTH DAILY AT NIGHT 90 tablet 1   amLODipine  (NORVASC ) 10 MG tablet Take 1 tablet (10 mg total) by mouth daily. 30 tablet 1   Ascorbic Acid (VITAMIN C) 1000 MG tablet Take 1,000 mg by mouth daily.     aspirin EC 81 MG tablet Take 243 mg by mouth daily. Swallow whole.     atorvastatin  (LIPITOR) 20 MG tablet Take 1 tablet (20 mg total) by mouth daily. 90 tablet 1   bisoprolol  (ZEBETA ) 5 MG tablet TAKE 1 AND 1/2 TABLETS(7.5 MG) BY MOUTH EVERY DAY 135 tablet 3   ergocalciferol  (VITAMIN D2) 1.25 MG (50000 UT) capsule Take 50,000 Units by mouth once a week.     hydrochlorothiazide  (HYDRODIURIL ) 25 MG tablet TAKE 1 TABLET BY MOUTH DAILY. 30 tablet 11   lisinopril  (ZESTRIL ) 40 MG tablet TAKE 1 TABLET(40 MG) BY MOUTH DAILY 30 tablet 4   Omega-3 Fatty  Acids (FISH OIL PO) Take 1 capsule by mouth daily.     VITAMIN E PO Take 1 capsule by mouth daily.     fluticasone  (FLONASE ) 50 MCG/ACT nasal spray SHAKE LIQUID AND USE 2 SPRAYS IN EACH NOSTRIL DAILY (Patient not taking: Reported on 08/31/2023) 48 g 0   No facility-administered medications prior to visit.     Per HPI unless specifically indicated in ROS section below Review of Systems  Objective:  BP 124/82   Pulse 65   Temp 98.6 F (37 C) (Oral)   Ht 6' (1.829 m)   Wt 181 lb 4 oz (82.2 kg)   SpO2 98%   BMI 24.58 kg/m   Wt Readings from Last 3 Encounters:  05/14/24 181 lb 4 oz (82.2 kg)  08/31/23 179 lb 9.6 oz (81.5 kg)  05/23/23 178 lb 6 oz (80.9 kg)      Physical Exam Vitals and nursing note reviewed.  Constitutional:      Appearance: Normal appearance. He is not ill-appearing.  Skin:    General: Skin is warm and dry.      Findings: Lesion present. No rash.     Comments: Pruritic 2x1.7cm lesion to left lower back with mild nodularity and edema, mild central ulceration   Neurological:     Mental Status: He is alert.  Psychiatric:        Mood and Affect: Mood normal.        Behavior: Behavior normal.         Assessment & Plan:   Problem List Items Addressed This Visit     Skin lesion of back - Primary   Suspect exacerbated tinea corporis due to topical steroid use.  Rec stop cortizone-10, start otc clotrimazole (Lotrimin).  Update if not improving with treatment, r/o basal cell skin cancer vs other cause - dermatology referral placed for further evaluation in case not improved with above. If all better, he will cancel dermatology appt.  Encouraged regular sunscreen use      Relevant Orders   Ambulatory referral to Dermatology     No orders of the defined types were placed in this encounter.   Orders Placed This Encounter  Procedures   Ambulatory referral to Dermatology    Referral Priority:   Routine    Referral Type:   Consultation    Referral Reason:   Specialty Services Required    Requested Specialty:   Dermatology    Number of Visits Requested:   1    Patient Instructions  Possible infeccion de hongo (ringworm) Use crema topical clotrimazole (lotrimin) la cual se puede comprar sin receta - 2 veces al dia por 3-4 semanas. Si no mejora, recomiendo Interior and spatial designer. He hecho remision.   Follow up plan: Return if symptoms worsen or fail to improve.  Anton Blas, MD

## 2024-05-14 NOTE — Patient Instructions (Addendum)
 Possible infeccion de hongo (ringworm) Use crema topical clotrimazole (lotrimin) la cual se puede comprar sin receta - 2 veces al dia por 3-4 semanas. Si no mejora, recomiendo Interior and spatial designer. He hecho remision.

## 2024-05-14 NOTE — Assessment & Plan Note (Addendum)
 Suspect exacerbated tinea corporis due to topical steroid use.  Rec stop cortizone-10, start otc clotrimazole (Lotrimin).  Update if not improving with treatment, r/o basal cell skin cancer vs other cause - dermatology referral placed for further evaluation in case not improved with above. If all better, he will cancel dermatology appt.  Encouraged regular sunscreen use

## 2024-05-22 ENCOUNTER — Ambulatory Visit

## 2024-06-22 ENCOUNTER — Encounter: Payer: Self-pay | Admitting: Pharmacist

## 2024-06-22 NOTE — Progress Notes (Signed)
 Pharmacy Quality Measure Review  This patient is appearing on a report for being at risk of failing the adherence measure for cholesterol (statin) medications this calendar year.   Medication: atorvastatin  40 mg Last fill date: 11/09/23 for 90 day supply  Insurance report was not up to date. No action needed at this time.  Medication has been refilled as of 05/07/24 though long overdue.  No refills remaining. Refill due October 08/05/24. Reminder set for 10/22.

## 2024-06-28 ENCOUNTER — Other Ambulatory Visit: Payer: Self-pay | Admitting: Family Medicine

## 2024-06-28 DIAGNOSIS — M109 Gout, unspecified: Secondary | ICD-10-CM

## 2024-06-28 NOTE — Telephone Encounter (Signed)
Pt is overdue for CPE (labs prior) please schedule and then route back to me to refill

## 2024-06-28 NOTE — Telephone Encounter (Signed)
 lvm for pt to call office to schedule appt.

## 2024-06-29 NOTE — Telephone Encounter (Signed)
 lvm for pt to call office to schedule appt.

## 2024-07-02 NOTE — Telephone Encounter (Signed)
 Lvm and sent mychart

## 2024-07-03 NOTE — Telephone Encounter (Signed)
 Noted (see other messages by clicking blue '[address] [place]' link).

## 2024-07-24 ENCOUNTER — Other Ambulatory Visit: Payer: Self-pay | Admitting: Family Medicine

## 2024-08-01 ENCOUNTER — Encounter: Payer: Self-pay | Admitting: Pharmacist

## 2024-08-01 NOTE — Progress Notes (Signed)
 Pharmacy Quality Measure Review  This patient is appearing on a report for being at risk of failing the adherence measure for cholesterol (statin) medications this calendar year.   Medication: atorvastatin  40 mg Last fill date: 05/07/24 for 90 day supply  Zero refilled remaining. Refill due by 08/05/24 Will collaborate with provider to facilitate refill needs.  PCP pool cc'd.

## 2024-08-04 ENCOUNTER — Other Ambulatory Visit: Payer: Self-pay | Admitting: Internal Medicine

## 2024-09-02 ENCOUNTER — Telehealth: Payer: Self-pay | Admitting: Family Medicine

## 2024-09-02 DIAGNOSIS — E559 Vitamin D deficiency, unspecified: Secondary | ICD-10-CM

## 2024-09-02 DIAGNOSIS — E785 Hyperlipidemia, unspecified: Secondary | ICD-10-CM

## 2024-09-02 DIAGNOSIS — M109 Gout, unspecified: Secondary | ICD-10-CM

## 2024-09-02 DIAGNOSIS — I10 Essential (primary) hypertension: Secondary | ICD-10-CM

## 2024-09-02 DIAGNOSIS — R7303 Prediabetes: Secondary | ICD-10-CM

## 2024-09-02 NOTE — Telephone Encounter (Signed)
-----   Message from Veva JINNY Ferrari sent at 08/22/2024  3:09 PM EST ----- Regarding: Lab orders for Tue, 11.25.25 Patient is scheduled for CPX labs, please order future labs, Thanks , Veva

## 2024-09-03 ENCOUNTER — Other Ambulatory Visit

## 2024-09-04 ENCOUNTER — Other Ambulatory Visit

## 2024-09-04 ENCOUNTER — Ambulatory Visit: Payer: Self-pay | Admitting: Family Medicine

## 2024-09-04 DIAGNOSIS — E559 Vitamin D deficiency, unspecified: Secondary | ICD-10-CM | POA: Diagnosis not present

## 2024-09-04 DIAGNOSIS — E785 Hyperlipidemia, unspecified: Secondary | ICD-10-CM

## 2024-09-04 DIAGNOSIS — I10 Essential (primary) hypertension: Secondary | ICD-10-CM | POA: Diagnosis not present

## 2024-09-04 DIAGNOSIS — R7303 Prediabetes: Secondary | ICD-10-CM | POA: Diagnosis not present

## 2024-09-04 DIAGNOSIS — M109 Gout, unspecified: Secondary | ICD-10-CM | POA: Diagnosis not present

## 2024-09-04 LAB — VITAMIN D 25 HYDROXY (VIT D DEFICIENCY, FRACTURES): VITD: 19.14 ng/mL — ABNORMAL LOW (ref 30.00–100.00)

## 2024-09-04 LAB — CBC WITH DIFFERENTIAL/PLATELET
Basophils Absolute: 0 K/uL (ref 0.0–0.1)
Basophils Relative: 0.6 % (ref 0.0–3.0)
Eosinophils Absolute: 0.3 K/uL (ref 0.0–0.7)
Eosinophils Relative: 5.7 % — ABNORMAL HIGH (ref 0.0–5.0)
HCT: 43.7 % (ref 39.0–52.0)
Hemoglobin: 14.9 g/dL (ref 13.0–17.0)
Lymphocytes Relative: 18.2 % (ref 12.0–46.0)
Lymphs Abs: 1.1 K/uL (ref 0.7–4.0)
MCHC: 34 g/dL (ref 30.0–36.0)
MCV: 97.3 fl (ref 78.0–100.0)
Monocytes Absolute: 0.5 K/uL (ref 0.1–1.0)
Monocytes Relative: 8.9 % (ref 3.0–12.0)
Neutro Abs: 4 K/uL (ref 1.4–7.7)
Neutrophils Relative %: 66.6 % (ref 43.0–77.0)
Platelets: 99 K/uL — ABNORMAL LOW (ref 150.0–400.0)
RBC: 4.5 Mil/uL (ref 4.22–5.81)
RDW: 13.9 % (ref 11.5–15.5)
WBC: 6.1 K/uL (ref 4.0–10.5)

## 2024-09-04 LAB — TSH: TSH: 1.51 u[IU]/mL (ref 0.35–5.50)

## 2024-09-04 LAB — COMPREHENSIVE METABOLIC PANEL WITH GFR
ALT: 43 U/L (ref 0–53)
AST: 49 U/L — ABNORMAL HIGH (ref 0–37)
Albumin: 4.1 g/dL (ref 3.5–5.2)
Alkaline Phosphatase: 49 U/L (ref 39–117)
BUN: 22 mg/dL (ref 6–23)
CO2: 32 meq/L (ref 19–32)
Calcium: 8.8 mg/dL (ref 8.4–10.5)
Chloride: 100 meq/L (ref 96–112)
Creatinine, Ser: 0.81 mg/dL (ref 0.40–1.50)
GFR: 82.77 mL/min (ref >60.00)
Glucose, Bld: 122 mg/dL — ABNORMAL HIGH (ref 70–99)
Potassium: 4 meq/L (ref 3.5–5.1)
Sodium: 139 meq/L (ref 135–145)
Total Bilirubin: 0.4 mg/dL (ref 0.2–1.2)
Total Protein: 6.1 g/dL (ref 6.0–8.3)

## 2024-09-04 LAB — LIPID PANEL
Cholesterol: 149 mg/dL (ref 0–200)
HDL: 30.8 mg/dL — ABNORMAL LOW (ref >39.00)
NonHDL: 118.31
Total CHOL/HDL Ratio: 5
Triglycerides: 1125 mg/dL — ABNORMAL HIGH (ref 0.0–149.0)
VLDL: 225 mg/dL — ABNORMAL HIGH (ref 0.0–40.0)

## 2024-09-04 LAB — LDL CHOLESTEROL, DIRECT: Direct LDL: 25 mg/dL

## 2024-09-04 LAB — HEMOGLOBIN A1C: Hgb A1c MFr Bld: 5.9 % (ref 4.6–6.5)

## 2024-09-04 LAB — URIC ACID: Uric Acid, Serum: 5.4 mg/dL (ref 4.0–7.8)

## 2024-09-10 ENCOUNTER — Ambulatory Visit: Admitting: Family Medicine

## 2024-09-10 ENCOUNTER — Ambulatory Visit

## 2024-09-10 ENCOUNTER — Encounter: Payer: Self-pay | Admitting: Family Medicine

## 2024-09-10 VITALS — BP 138/83 | HR 57 | Temp 97.8°F | Ht 71.25 in | Wt 180.2 lb

## 2024-09-10 DIAGNOSIS — Z0000 Encounter for general adult medical examination without abnormal findings: Secondary | ICD-10-CM | POA: Diagnosis not present

## 2024-09-10 DIAGNOSIS — D696 Thrombocytopenia, unspecified: Secondary | ICD-10-CM

## 2024-09-10 DIAGNOSIS — E785 Hyperlipidemia, unspecified: Secondary | ICD-10-CM

## 2024-09-10 DIAGNOSIS — I10 Essential (primary) hypertension: Secondary | ICD-10-CM

## 2024-09-10 DIAGNOSIS — M109 Gout, unspecified: Secondary | ICD-10-CM | POA: Diagnosis not present

## 2024-09-10 DIAGNOSIS — E559 Vitamin D deficiency, unspecified: Secondary | ICD-10-CM

## 2024-09-10 DIAGNOSIS — I5032 Chronic diastolic (congestive) heart failure: Secondary | ICD-10-CM | POA: Diagnosis not present

## 2024-09-10 DIAGNOSIS — Z23 Encounter for immunization: Secondary | ICD-10-CM

## 2024-09-10 DIAGNOSIS — R7303 Prediabetes: Secondary | ICD-10-CM

## 2024-09-10 MED ORDER — ERGOCALCIFEROL 1.25 MG (50000 UT) PO CAPS: 50000.0000 [IU] | ORAL_CAPSULE | ORAL | 2 refills | Status: AC

## 2024-09-10 NOTE — Assessment & Plan Note (Signed)
 Plt ct is 99  No excess bleeding /bruising Consider heme consult if this goes down further   Re check 1 mo

## 2024-09-10 NOTE — Assessment & Plan Note (Signed)
 Prediabetes  Lab Results  Component Value Date   HGBA1C 5.9 09/04/2024   HGBA1C 5.9 05/23/2023   HGBA1C 6.4 05/19/2022    disc imp of low glycemic diet and wt loss to prevent DM2

## 2024-09-10 NOTE — Progress Notes (Signed)
 Subjective:    Patient ID: Jeffrey [name], male    DOB: 1941-08-03, 83 y.o.   MRN: [id number]  HPI  Here for health maintenance exam and to review chronic medical problems   Wt Readings from Last 3 Encounters:  09/10/24 180 lb 4 oz (81.8 kg)  05/14/24 181 lb 4 oz (82.2 kg)  08/31/23 179 lb 9.6 oz (81.5 kg)   24.96 kg/m  Vitals:   09/10/24 0917 09/10/24 0940  BP: (!) 142/80 138/83  Pulse: (!) 57   Temp: 97.8 F (36.6 C)   SpO2: 96%     Immunization History  Administered Date(s) Administered   Fluad Quad(high Dose 65+) 08/21/2020   Hepatitis A, Adult 07/21/2016   Influenza Split 08/25/2011   Influenza,inj,Quad PF,6+ Mos 08/01/2013, 11/05/2014, 07/21/2016   PFIZER(Purple Top)SARS-COV-2 Vaccination 11/16/2019, 12/11/2019, 07/25/2020   Pneumococcal Conjugate-13 11/05/2014   Pneumococcal Polysaccharide-23 08/25/2011   Td 04/01/2003, 08/01/2013   Zoster Recombinant(Shingrix) 07/24/2019    Health Maintenance Due  Topic Date Due   Influenza Vaccine  05/11/2024   Medicare Annual Wellness (AWV)  05/22/2024     Flu shot - today   Shingrix 2020   Due for tetanus shot   Prostate health Lab Results  Component Value Date   PSA 2.11 08/18/2020   PSA 1.82 04/23/2019   PSA 1.30 01/11/2018   Using prostate advance supplement and it helps  In past 6 mo  Nocturia is 1-2 times per night     Colon cancer screening  Colonoscopy 06/2022  No recall due to age   Bone health   Falls- none Fractures-none  Supplements - vitamin D3 , though level is still low  Last vitamin D  Lab Results  Component Value Date   VD25OH 19.14 (L) 09/04/2024   Took prescription D for a while from rheum   Exercise  Playing tennis 2-3 times per week  Walking 30 minutes daily     Mood    09/10/2024    9:20 AM 05/14/2024    3:49 PM 05/23/2023   10:24 AM 03/17/2023   10:01 AM 12/07/2022   11:16 AM  Depression screen PHQ 2/9  Decreased Interest 0 0 0 0 0  Down, Depressed, Hopeless 0 0 0  0 0  PHQ - 2 Score 0 0 0 0 0  Altered sleeping 0 0 0    Tired, decreased energy 0 0 0    Change in appetite 0 0 0    Feeling bad or failure about yourself  0 0 0    Trouble concentrating 0 0 0    Moving slowly or fidgety/restless 0 0 0    Suicidal thoughts 0 0 0    PHQ-9 Score 0 0  0     Difficult doing work/chores Not difficult at all Not difficult at all Not difficult at all       Data saved with a previous flowsheet row definition    HTN bp is stable today  No cp or palpitations or headaches or edema  No side effects to medicines  BP Readings from Last 3 Encounters:  09/10/24 138/83  05/14/24 124/82  08/31/23 119/80     Lab Results  Component Value Date   NA 139 09/04/2024   K 4.0 09/04/2024   CO2 32 09/04/2024   GLUCOSE 122 (H) 09/04/2024   BUN 22 09/04/2024   CREATININE 0.81 09/04/2024   CALCIUM  8.8 09/04/2024   GFR 82.77 09/04/2024   GFRNONAA >60 12/09/2021   Bisoprolol   5 mg daily Lisinopril  40 mg daily  Amlodipine  10 mg daily  Hctz 25 mg daily  Asa 81 mg daily with past history of CVA  No cardiac changes   History of thrombocytopenia  Lab Results  Component Value Date   WBC 6.1 09/04/2024   HGB 14.9 09/04/2024   HCT 43.7 09/04/2024   MCV 97.3 09/04/2024   PLT 99.0 (L) 09/04/2024   No changes in bruising or bleeding   Gout Allopurinol  300 mg daily  Lab Results  Component Value Date   LABURIC 5.4 09/04/2024   No flares at all   Hyperlipidemia Lab Results  Component Value Date   CHOL 149 09/04/2024   CHOL 108 05/23/2023   CHOL 117 01/25/2022   Lab Results  Component Value Date   HDL 30.80 (L) 09/04/2024   HDL 39.60 05/23/2023   HDL 49.00 01/25/2022   Lab Results  Component Value Date   LDLCALC 35 05/23/2023   LDLCALC 32 01/25/2022   LDLCALC 41 12/09/2021   Lab Results  Component Value Date   TRIG (H) 09/04/2024    1125.0 Triglyceride is over 400; calculations on Lipids are invalid.   TRIG 167.0 (H) 05/23/2023   TRIG 177.0 (H)  01/25/2022   Lab Results  Component Value Date   CHOLHDL 5 09/04/2024   CHOLHDL 3 05/23/2023   CHOLHDL 2 01/25/2022   Lab Results  Component Value Date   LDLDIRECT 25.0 09/04/2024   LDLDIRECT 49.0 08/18/2020   LDLDIRECT 42.0 04/23/2019   Atorvastatin  20 mg daily   Trig are much higher  Pt says he has eaten much higher fat diet / cheese specifically  This was a fasting draw  Missed his atorvastatin  dose that day     Prediabetes Lab Results  Component Value Date   HGBA1C 5.9 09/04/2024   HGBA1C 5.9 05/23/2023   HGBA1C 6.4 05/19/2022   Is good about sugar/ no added sugar in diet   Lab Results  Component Value Date   ALT 43 09/04/2024   AST 49 (H) 09/04/2024   ALKPHOS 49 09/04/2024   BILITOT 0.4 09/04/2024   Lab Results  Component Value Date   TSH 1.51 09/04/2024      Patient Active Problem List   Diagnosis Date Noted   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   Vitamin D  deficiency 05/23/2023   Positive ANA (antinuclear antibody) 11/22/2022   Joint pain 11/17/2022   History of colonic polyps    Adenomatous polyp of colon    History of stroke 12/05/2020   Heart failure with preserved ejection fraction (HCC) 03/01/2020   CHF (congestive heart failure) (HCC) 02/22/2020   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  EGD 04/2002   Gout    History of skin cancer    Hyperlipidemia    Hypertension    some white coat component- better at home    Stroke [facility])    TIA (transient ischemic attack)  Past Surgical History:  Procedure Laterality Date   COLONOSCOPY WITH PROPOFOL  N/A 01/16/2018   Procedure: COLONOSCOPY WITH PROPOFOL ;  Surgeon: Gaylyn Gladis PENNER, MD;  Location: [facility] ENDOSCOPY;  Service: Endoscopy;  Laterality: N/A;   COLONOSCOPY WITH PROPOFOL  N/A 12/10/2021   Procedure: COLONOSCOPY WITH PROPOFOL ;  Surgeon: Therisa Bi, MD;  Location: [facility] ENDOSCOPY;  Service: Gastroenterology;  Laterality: N/A;   COLONOSCOPY WITH PROPOFOL  N/A 06/23/2022   Procedure: COLONOSCOPY WITH PROPOFOL ;  Surgeon: Therisa Bi, MD;  Location: [facility] ENDOSCOPY;  Service: Gastroenterology;  Laterality: N/A;   HEMORRHOID SURGERY  1997   LOOP RECORDER INSERTION N/A 12/21/2016   Procedure: Loop Recorder Insertion;  Surgeon: Elspeth JAYSON Sage, MD;  Location: [facility] INVASIVE CV LAB;  Service: Cardiovascular;  Laterality: N/A;   TEE WITHOUT CARDIOVERSION N/A 12/21/2016   Procedure: TRANSESOPHAGEAL ECHOCARDIOGRAM (TEE);  Surgeon: Lonni Hanson, MD;  Location: [facility] ORS;  Service: Cardiovascular;  Laterality: N/A;   Social History   Tobacco Use   Smoking status: Former    Current packs/day: 0.00    Average packs/day: 1 pack/day for 8.0 years (8.0 ttl pk-yrs)    Types: Cigarettes    Start date: 1    Quit date: 24    Years since quitting: 52.9   Smokeless tobacco: Never  Vaping Use   Vaping status: Never Used  Substance Use Topics   Alcohol use: Yes    Alcohol/week: 7.0 standard drinks of alcohol    Types: 7 Glasses of wine per week    Comment: weekly-with dinner; 12/09/2021 sometimes   Drug use: No   Family History  Problem Relation Age of Onset   Other Mother        brain tumor   Skin cancer Mother        suspect melanoma   Rheum arthritis Mother    Heart attack Father 81   Colon cancer Sister    No Known Allergies Current Outpatient Medications on File Prior to Visit  Medication Sig Dispense Refill   allopurinol  (ZYLOPRIM ) 300 MG tablet TAKE 1  TABLET(300 MG) BY MOUTH DAILY AT NIGHT 90 tablet 0   amLODipine  (NORVASC ) 10 MG tablet Take 1 tablet (10 mg total) by mouth daily. 30 tablet 1   Ascorbic Acid (VITAMIN C) 1000 MG tablet Take 1,000 mg by mouth daily.     aspirin EC 81 MG tablet Take 243 mg by mouth daily. Swallow whole.     atorvastatin  (LIPITOR) 20 MG tablet Take 1 tablet (20 mg total) by mouth daily. 90 tablet 1   bisoprolol  (ZEBETA ) 5 MG tablet TAKE 1 AND 1/2 TABLETS(7.5 MG) BY MOUTH EVERY DAY 135 tablet 1   hydrochlorothiazide  (HYDRODIURIL ) 25 MG tablet Take 1 tablet (25 mg total) by mouth daily. 30 tablet 0   lisinopril  (ZESTRIL ) 40 MG tablet TAKE 1 TABLET(40 MG) BY MOUTH DAILY 30 tablet 4   Omega-3 Fatty Acids (FISH OIL PO) Take 1 capsule by mouth daily.     VITAMIN E PO Take 1 capsule by mouth daily.     No current facility-administered medications on file prior to visit.    Review of Systems  Constitutional:  Negative for activity change, appetite change, fatigue, fever and unexpected weight change.  HENT:  Negative for congestion, rhinorrhea, sore throat and trouble swallowing.   Eyes:  Negative for pain, redness, itching and visual disturbance.  Respiratory:  Negative for cough, chest tightness, shortness of breath and wheezing.   Cardiovascular:  Negative for chest pain and palpitations.  Gastrointestinal:  Negative for abdominal pain, blood in stool, constipation, diarrhea and nausea.  Endocrine: Negative for cold intolerance, heat intolerance, polydipsia and polyuria.  Genitourinary:  Negative for difficulty urinating, dysuria, frequency and urgency.       Nocturia times 1-2 average   Musculoskeletal:  Negative for arthralgias, joint swelling and myalgias.  Skin:  Negative for pallor and rash.  Neurological:  Negative for dizziness, tremors, weakness, numbness and headaches.  Hematological:  Negative for adenopathy. Does not bruise/bleed easily.  Psychiatric/Behavioral:  Negative for decreased concentration  and dysphoric mood. The patient is not nervous/anxious.        Objective:   Physical Exam Constitutional:      General: He is not in acute distress.    Appearance: Normal appearance. He is well-developed and normal weight. He is not ill-appearing or diaphoretic.  HENT:     Head: Normocephalic and atraumatic.     Right Ear: Tympanic membrane, ear canal and external ear normal.     Left Ear: Tympanic membrane, ear canal and external ear normal.     Nose: Nose normal. No congestion.     Mouth/Throat:     Mouth: Mucous membranes are moist.     Pharynx: Oropharynx is clear. No posterior oropharyngeal erythema.  Eyes:     General: No scleral icterus.       Right eye: No discharge.        Left eye: No discharge.     Conjunctiva/sclera: Conjunctivae normal.     Pupils: Pupils are equal, round, and reactive to light.  Neck:     Thyroid : No thyromegaly.     Vascular: No carotid bruit or JVD.  Cardiovascular:     Rate and Rhythm: Normal rate and regular rhythm.     Pulses: Normal pulses.     Heart sounds: Normal heart sounds.     No gallop.  Pulmonary:     Effort: Pulmonary effort is normal. No respiratory distress.     Breath sounds: Normal breath sounds. No wheezing or rales.     Comments: Good air exch Chest:     Chest wall: No tenderness.  Abdominal:     General: Bowel sounds are normal. There is no distension or abdominal bruit.     Palpations: Abdomen is soft. There is no mass.     Tenderness: There is no abdominal tenderness.     Hernia: No hernia is present.  Musculoskeletal:        General: No tenderness.     Cervical back: Normal range of motion and neck supple. No rigidity. No muscular tenderness.     Right lower leg: No edema.     Left lower leg: No edema.  Lymphadenopathy:     Cervical: No cervical adenopathy.  Skin:    General: Skin is warm and dry.     Coloration: Skin is not pale.     Findings: No erythema or rash.     Comments: Tanned Solar lentigines  diffusely Some sks   Neurological:     Mental Status: He is alert.     Cranial Nerves: No cranial nerve deficit.     Motor: No abnormal muscle tone.     Coordination: Coordination normal.     Gait: Gait normal.     Deep Tendon Reflexes: Reflexes are normal and symmetric. Reflexes normal.  Psychiatric:        Mood and Affect: Mood normal.        Cognition and Memory: Cognition and memory normal.  Assessment & Plan:   Problem List Items Addressed This Visit       Cardiovascular and Mediastinum   Primary hypertension   bp is stable/in setting of resistant HTN  BP Readings from Last 1 Encounters:  09/10/24 138/83  For cardiology follow up soon/routine  Most recent labs reviewed  Disc lifstyle change with low sodium diet and exercise  Plan to continue Bisoprolol  5 mg daily Lisinopril  40 mg daily  Amlodipine  10 mg daily  Hctz 25 mg daily       Heart failure with preserved ejection fraction (HCC)   No clinical changes  Takes lisinopril  and bisoprolol    For routine cardiology visit soon        Hematopoietic and Hemostatic   Thrombocytopenia   Plt ct is 99  No excess bleeding /bruising Consider heme consult if this goes down further   Re check 1 mo       Relevant Orders   CBC with Differential/Platelet     Other   Vitamin D  deficiency   Last vitamin D  Lab Results  Component Value Date   VD25OH 19.14 (L) 09/04/2024   Despite over the counter D3  Will re start high dose weekly ergocalciferol   Discussed importance to bone and overall health      Routine general medical examination at a health care facility - Primary   Reviewed health habits including diet and exercise and skin cancer prevention Reviewed appropriate screening tests for age  Also reviewed health mt list, fam hx and immunization status , as well as social and family history   See HPI Labs reviewed and ordered Health Maintenance  Topic Date Due   Flu Shot  05/11/2024    Medicare Annual Wellness Visit  05/22/2024   DTaP/Tdap/Td vaccine (3 - Tdap) 09/10/2025*   COVID-19 Vaccine (4 - 2025-26 season) 09/26/2025*   Zoster (Shingles) Vaccine (2 of 2) 12/09/2025*   Pneumococcal Vaccine for age over 33  Completed   Meningitis B Vaccine  Aged Out   Colon Cancer Screening  Discontinued   Hepatitis C Screening  Discontinued  *Topic was postponed. The date shown is not the original due date.    Flu shot given Plans to get Td and shingrix at the pharmacy Discussed fall prevention, supplements and exercise for bone density  - will supplement vit D Commended good exercise  Encouraged use of sunscreen  PHQ 0       Prediabetes   Prediabetes  Lab Results  Component Value Date   HGBA1C 5.9 09/04/2024   HGBA1C 5.9 05/23/2023   HGBA1C 6.4 05/19/2022    disc imp of low glycemic diet and wt loss to prevent DM2        Hyperlipidemia LDL goal <70   Disc goals for lipids and reasons to control them Rev last labs with pt Rev low sat fat diet in detail   LDL is still well controlled at 35 HDL down despite good exercise   Surprisingly triglycerides very high at 1125  Per pt-ate a lot of fatty cheese prior to draw  Draw was fasting but skipped statin that day  In distant past this was up to 800   AST 49 - will watch this   Plans to watch diet more carefully  Re check 1 mo May need fibrate in addition to statin       Relevant Orders   Lipid panel   Hepatic function panel   Gout   Takes allopurinol  300 mg daily  No flares at all Watches diet  Uric acid level is controlled  Lab Results  Component Value Date   LABURIC 5.4 09/04/2024

## 2024-09-10 NOTE — Patient Instructions (Addendum)
 You are due for a tetanus shot  You can get that at the pharmacy   You are also due for your 2nd shingrix vaccine at pharmacy   Flu shot today   Watch your diet for fat the best you can  Let's re check the cholesterol in a month  The day of the draw - don't eat but you can take your medicine   We will check your platelets then also   Get started back on weekly high dose vitamin D  since your level is low

## 2024-09-10 NOTE — Assessment & Plan Note (Signed)
 Reviewed health habits including diet and exercise and skin cancer prevention Reviewed appropriate screening tests for age  Also reviewed health mt list, fam hx and immunization status , as well as social and family history   See HPI Labs reviewed and ordered Health Maintenance  Topic Date Due   Flu Shot  05/11/2024   Medicare Annual Wellness Visit  05/22/2024   DTaP/Tdap/Td vaccine (3 - Tdap) 09/10/2025*   COVID-19 Vaccine (4 - 2025-26 season) 09/26/2025*   Zoster (Shingles) Vaccine (2 of 2) 12/09/2025*   Pneumococcal Vaccine for age over 45  Completed   Meningitis B Vaccine  Aged Out   Colon Cancer Screening  Discontinued   Hepatitis C Screening  Discontinued  *Topic was postponed. The date shown is not the original due date.    Flu shot given Plans to get Td and shingrix at the pharmacy Discussed fall prevention, supplements and exercise for bone density  - will supplement vit D Commended good exercise  Encouraged use of sunscreen  PHQ 0

## 2024-09-10 NOTE — Assessment & Plan Note (Addendum)
 Disc goals for lipids and reasons to control them Rev last labs with pt Rev low sat fat diet in detail   LDL is still well controlled at 35 HDL down despite good exercise   Surprisingly triglycerides very high at 1125  Per pt-ate a lot of fatty cheese prior to draw  Draw was fasting but skipped statin that day  In distant past this was up to 800   AST 49 - will watch this   Plans to watch diet more carefully  Re check 1 mo May need fibrate in addition to statin

## 2024-09-10 NOTE — Assessment & Plan Note (Signed)
 Last vitamin D  Lab Results  Component Value Date   VD25OH 19.14 (L) 09/04/2024   Despite over the counter D3  Will re start high dose weekly ergocalciferol   Discussed importance to bone and overall health

## 2024-09-10 NOTE — Assessment & Plan Note (Signed)
 No clinical changes  Takes lisinopril  and bisoprolol    For routine cardiology visit soon

## 2024-09-10 NOTE — Assessment & Plan Note (Signed)
 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

## 2024-09-10 NOTE — Assessment & Plan Note (Signed)
 Takes allopurinol  300 mg daily  No flares at all Watches diet  Uric acid level is controlled  Lab Results  Component Value Date   LABURIC 5.4 09/04/2024

## 2024-09-25 ENCOUNTER — Other Ambulatory Visit: Payer: Self-pay | Admitting: Internal Medicine

## 2024-09-26 ENCOUNTER — Other Ambulatory Visit: Payer: Self-pay | Admitting: Internal Medicine

## 2024-09-26 NOTE — Telephone Encounter (Signed)
Please contact pt for future appointment. 

## 2024-09-27 NOTE — Telephone Encounter (Signed)
 Left vm.

## 2024-09-29 ENCOUNTER — Other Ambulatory Visit: Payer: Self-pay | Admitting: Family Medicine

## 2024-09-29 DIAGNOSIS — M109 Gout, unspecified: Secondary | ICD-10-CM

## 2024-10-15 ENCOUNTER — Other Ambulatory Visit (INDEPENDENT_AMBULATORY_CARE_PROVIDER_SITE_OTHER)

## 2024-10-15 ENCOUNTER — Ambulatory Visit: Payer: Self-pay | Admitting: Family Medicine

## 2024-10-15 DIAGNOSIS — E785 Hyperlipidemia, unspecified: Secondary | ICD-10-CM | POA: Diagnosis not present

## 2024-10-15 DIAGNOSIS — D696 Thrombocytopenia, unspecified: Secondary | ICD-10-CM | POA: Diagnosis not present

## 2024-10-15 LAB — LIPID PANEL
Cholesterol: 120 mg/dL (ref 28–200)
HDL: 41.8 mg/dL
LDL Cholesterol: 29 mg/dL (ref 10–99)
NonHDL: 78.5
Total CHOL/HDL Ratio: 3
Triglycerides: 246 mg/dL — ABNORMAL HIGH (ref 10.0–149.0)
VLDL: 49.2 mg/dL — ABNORMAL HIGH (ref 0.0–40.0)

## 2024-10-15 LAB — CBC WITH DIFFERENTIAL/PLATELET
Basophils Absolute: 0 K/uL (ref 0.0–0.1)
Basophils Relative: 0.6 % (ref 0.0–3.0)
Eosinophils Absolute: 0.4 K/uL (ref 0.0–0.7)
Eosinophils Relative: 6.7 % — ABNORMAL HIGH (ref 0.0–5.0)
HCT: 42.9 % (ref 39.0–52.0)
Hemoglobin: 14.8 g/dL (ref 13.0–17.0)
Lymphocytes Relative: 27.7 % (ref 12.0–46.0)
Lymphs Abs: 1.6 K/uL (ref 0.7–4.0)
MCHC: 34.5 g/dL (ref 30.0–36.0)
MCV: 95.8 fl (ref 78.0–100.0)
Monocytes Absolute: 0.5 K/uL (ref 0.1–1.0)
Monocytes Relative: 8.8 % (ref 3.0–12.0)
Neutro Abs: 3.2 K/uL (ref 1.4–7.7)
Neutrophils Relative %: 56.2 % (ref 43.0–77.0)
Platelets: 113 K/uL — ABNORMAL LOW (ref 150.0–400.0)
RBC: 4.47 Mil/uL (ref 4.22–5.81)
RDW: 13.3 % (ref 11.5–15.5)
WBC: 5.7 K/uL (ref 4.0–10.5)

## 2024-10-15 LAB — HEPATIC FUNCTION PANEL
ALT: 30 U/L (ref 3–53)
AST: 23 U/L (ref 5–37)
Albumin: 4 g/dL (ref 3.5–5.2)
Alkaline Phosphatase: 44 U/L (ref 39–117)
Bilirubin, Direct: 0.2 mg/dL (ref 0.1–0.3)
Total Bilirubin: 0.7 mg/dL (ref 0.2–1.2)
Total Protein: 6.5 g/dL (ref 6.0–8.3)

## 2024-10-17 ENCOUNTER — Telehealth: Payer: Self-pay | Admitting: *Deleted

## 2024-10-17 NOTE — Telephone Encounter (Signed)
 Pt viewed labs via mychart and per Dr. Randeen:   Re check cbc for platelet count in 2-3 months please   Please schedule non fasting lab appt for pt, thanks

## 2024-10-27 ENCOUNTER — Other Ambulatory Visit: Payer: Self-pay | Admitting: Internal Medicine

## 2024-10-28 ENCOUNTER — Other Ambulatory Visit: Payer: Self-pay | Admitting: Family Medicine

## 2024-10-30 ENCOUNTER — Other Ambulatory Visit: Payer: Self-pay | Admitting: Internal Medicine

## 2024-11-12 ENCOUNTER — Other Ambulatory Visit: Payer: Self-pay | Admitting: Internal Medicine

## 2024-11-12 ENCOUNTER — Other Ambulatory Visit: Payer: Self-pay | Admitting: Family Medicine

## 2024-12-07 ENCOUNTER — Ambulatory Visit

## 2024-12-24 ENCOUNTER — Other Ambulatory Visit

## 2025-02-27 ENCOUNTER — Ambulatory Visit: Admitting: Internal Medicine
